# Patient Record
Sex: Male | Born: 1945 | Race: White | Hispanic: No | Marital: Married | Smoking: Former smoker
Health system: Southern US, Community
[De-identification: ages and names within clinical notes are randomized; demographics above are authoritative.]

## PROBLEM LIST (undated history)

## (undated) DIAGNOSIS — C801 Malignant (primary) neoplasm, unspecified: Secondary | ICD-10-CM

## (undated) DIAGNOSIS — R918 Other nonspecific abnormal finding of lung field: Secondary | ICD-10-CM

## (undated) DIAGNOSIS — C7951 Secondary malignant neoplasm of bone: Secondary | ICD-10-CM

## (undated) DIAGNOSIS — K759 Inflammatory liver disease, unspecified: Secondary | ICD-10-CM

## (undated) DIAGNOSIS — M79643 Pain in unspecified hand: Secondary | ICD-10-CM

## (undated) DIAGNOSIS — I714 Abdominal aortic aneurysm, without rupture, unspecified: Secondary | ICD-10-CM

## (undated) DIAGNOSIS — K7460 Unspecified cirrhosis of liver: Secondary | ICD-10-CM

## (undated) DIAGNOSIS — I959 Hypotension, unspecified: Secondary | ICD-10-CM

## (undated) DIAGNOSIS — B181 Chronic viral hepatitis B without delta-agent: Secondary | ICD-10-CM

## (undated) DIAGNOSIS — R0600 Dyspnea, unspecified: Secondary | ICD-10-CM

## (undated) DIAGNOSIS — D649 Anemia, unspecified: Secondary | ICD-10-CM

## (undated) DIAGNOSIS — M1990 Unspecified osteoarthritis, unspecified site: Secondary | ICD-10-CM

## (undated) DIAGNOSIS — J449 Chronic obstructive pulmonary disease, unspecified: Secondary | ICD-10-CM

## (undated) DIAGNOSIS — K219 Gastro-esophageal reflux disease without esophagitis: Secondary | ICD-10-CM

## (undated) HISTORY — DX: Chronic obstructive pulmonary disease, unspecified: J44.9

## (undated) HISTORY — DX: Dyspnea, unspecified: R06.00

## (undated) HISTORY — DX: Chronic viral hepatitis B without delta-agent: B18.1

## (undated) HISTORY — DX: Abdominal aortic aneurysm, without rupture: I71.4

## (undated) HISTORY — DX: Hypotension, unspecified: I95.9

## (undated) HISTORY — DX: Inflammatory liver disease, unspecified: K75.9

## (undated) HISTORY — DX: Other nonspecific abnormal finding of lung field: R91.8

## (undated) HISTORY — DX: Abdominal aortic aneurysm, without rupture, unspecified: I71.40

## (undated) HISTORY — DX: Secondary malignant neoplasm of bone: C79.51

## (undated) HISTORY — PX: APPENDECTOMY: SHX54

## (undated) HISTORY — PX: THROAT SURGERY: SHX803

## (undated) HISTORY — PX: TONSILLECTOMY: SUR1361

## (undated) HISTORY — DX: Pain in unspecified hand: M79.643

## (undated) HISTORY — DX: Anemia, unspecified: D64.9

## (undated) HISTORY — PX: BACK SURGERY: SHX140

## (undated) HISTORY — DX: Unspecified cirrhosis of liver: K74.60

## (undated) HISTORY — DX: Malignant (primary) neoplasm, unspecified: C80.1

## (undated) HISTORY — DX: Gastro-esophageal reflux disease without esophagitis: K21.9

## (undated) HISTORY — DX: Unspecified osteoarthritis, unspecified site: M19.90

---

## 2001-07-22 ENCOUNTER — Encounter (INDEPENDENT_AMBULATORY_CARE_PROVIDER_SITE_OTHER): Payer: Self-pay | Admitting: *Deleted

## 2001-07-22 ENCOUNTER — Inpatient Hospital Stay (HOSPITAL_COMMUNITY): Admission: EM | Admit: 2001-07-22 | Discharge: 2001-08-01 | Payer: Self-pay

## 2001-07-22 ENCOUNTER — Encounter (INDEPENDENT_AMBULATORY_CARE_PROVIDER_SITE_OTHER): Payer: Self-pay | Admitting: Specialist

## 2001-07-23 ENCOUNTER — Encounter: Payer: Self-pay | Admitting: Internal Medicine

## 2001-07-24 ENCOUNTER — Encounter: Payer: Self-pay | Admitting: Gastroenterology

## 2005-06-09 ENCOUNTER — Ambulatory Visit (HOSPITAL_COMMUNITY): Admission: RE | Admit: 2005-06-09 | Discharge: 2005-06-09 | Payer: Self-pay

## 2006-05-30 ENCOUNTER — Ambulatory Visit (HOSPITAL_COMMUNITY): Admission: RE | Admit: 2006-05-30 | Discharge: 2006-05-30 | Payer: Self-pay

## 2007-09-07 ENCOUNTER — Ambulatory Visit (HOSPITAL_COMMUNITY): Admission: RE | Admit: 2007-09-07 | Discharge: 2007-09-08 | Payer: Self-pay | Admitting: Orthopedic Surgery

## 2008-02-19 ENCOUNTER — Emergency Department (HOSPITAL_COMMUNITY): Admission: EM | Admit: 2008-02-19 | Discharge: 2008-02-19 | Payer: Self-pay | Admitting: Emergency Medicine

## 2008-08-15 ENCOUNTER — Ambulatory Visit: Payer: Self-pay | Admitting: Gastroenterology

## 2008-08-28 ENCOUNTER — Ambulatory Visit (HOSPITAL_COMMUNITY): Admission: RE | Admit: 2008-08-28 | Discharge: 2008-08-28 | Payer: Self-pay | Admitting: Gastroenterology

## 2009-08-14 ENCOUNTER — Ambulatory Visit: Payer: Self-pay | Admitting: Gastroenterology

## 2009-10-27 ENCOUNTER — Ambulatory Visit (HOSPITAL_COMMUNITY): Admission: RE | Admit: 2009-10-27 | Discharge: 2009-10-27 | Payer: Self-pay | Admitting: Gastroenterology

## 2010-05-24 HISTORY — PX: PROSTATECTOMY: SHX69

## 2010-06-14 ENCOUNTER — Encounter: Payer: Self-pay | Admitting: Gastroenterology

## 2010-08-10 LAB — CREATININE, SERUM
GFR calc Af Amer: >60 mL/min (ref >60)
GFR calc non Af Amer: >60 mL/min (ref >60)

## 2010-10-06 NOTE — Op Note (Signed)
NAME:  Alexander Ellis, MORI NO.:  [id number]   MEDICAL RECORD NO.:  [id number]          PATIENT TYPE:  OIB   LOCATION:  0098                         FACILITY:  [facility]   PHYSICIAN:  Georges Lynch. Gioffre, M.D.DATE OF BIRTH:  [birth year]   DATE OF PROCEDURE:  09/07/2007  DATE OF DISCHARGE:                               OPERATIVE REPORT   SURGEON:  Georges Lynch. Darrelyn Hillock, M.D.   ASSISTANT:  Marlowe Kays, M.D.   PREOPERATIVE DIAGNOSES:  1. Severe spinal stenosis at L4-5.  2. 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   POSTOPERATIVE DIAGNOSES:  1. Severe spinal stenosis at L4-5.  2. 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   OPERATIONS:  1. Complete decompressive lumbar laminectomy at L4-5 for spinal      stenosis.  2. Microdiskectomy at L4-5 on the left for a large herniated disk.   PROCEDURE:  Under general anesthesia with the patient on a spinal frame,  a routine orthopedic prep and draping of the back was carried out.  He  had 600 mg of clindamycin IV preoperatively.  At this time, after  sterile prep and drape was carried out, an incision was made over the  previous incision site and extended proximally.  Note, he had a previous  lumbar laminectomy on the right at L5-S1.  At this time, bleeders were  identified and cauterized.  Self-retaining retractors were inserted.  The muscle then was stripped from the lamina and spinous processes  bilaterally.  Two Kocher clamps were placed on the spinous processes of  L3 and L4, and an X-ray was taken.  We verified the L4-5 space.  At this  time,  another x-ray was later taken to verify the exact location of the  disk herniation.  I then inserted the [facility] retractors, and  following that I then removed the spinous process of L4.  Then, the  lamina of L4 was completely removed; we did a complete bilateral  decompressive  lumbar laminectomy at L4-L5.  At that time, we identified  the ligamentum flavum; the microscope was brought in prior to doing the  laminectomy.  We then removed the ligamentum flavum and decompressed the  dura.  Then we went out far laterally on the left, where the disk  herniation was, because it was an extremely large disk.  We identified  the L5 root; it was severely swollen.  We gently retracted it.  We  cauterized the lateral recess veins with a bipolar.  We identified the  large herniated disk.  A cruciate incision was made in the posterior  longitudinal ligament, and a complete diskectomy was carried out.  I  utilized a nerve root and the Epstein curets to go out and remove the  subligamentous disk fragments as well.  We went down and did a  foraminotomy as well.  Note, all of his symptoms were on the left.  We  made several passes into the disk space and subligamentous space, to  make sure there were no other disk fragments or compression; there was  not.  The dura now was free, as well as the root.  We thoroughly  irrigated out the area, loosely applied some thrombin-soaked Gelfoam to  the area, and then closed the wound layers in the usual fashion.  Note,  I left a small  portion of the distal and proximal deep parts of the wound open for  drainage purposes.  The skin was closed with metal staples and a sterile  Neosporin dressing was applied.  The patient left the operative room in  satisfactory position.           ______________________________  Georges Lynch Darrelyn Hillock, M.D.     RAG/MEDQ  D:  09/07/2007  T:  09/07/2007  Job:  [id number]

## 2010-10-09 NOTE — Discharge Summary (Signed)
[place]  Patient:    Alexander Ellis, Alexander Ellis Visit Number: [id number] MRN: [id number]          Service Type: MED Location: 5000 [id number] Attending Physician:  Madaline Guthrie Dictated by:   Jennette Kettle, M.D. Admit Date:  07/22/2001 Disc. Date: 07/28/01   CC:         Yvetta Coder, M.D. [facility]  Marguerita Merles, M.D., [facility]  Florencia Reasons, M.D.   Discharge Summary  DISCHARGE DIAGNOSES:  1. Chronic active hepatitis B, with evidence of end-stage liver disease     (newly diagnosed).  2. Coagulopathy, with an increased INR of 2.3 secondary to #1.  3. Acute ascites secondary to #1.  4. History of tobacco abuse.  5. History of back surgery x3, all greater than ten years ago.  6. Status post appendectomy.     a. Tonsillectomy.  7. History of gastroesophageal reflux disease.  DISCHARGE MEDICATIONS:  1. Lamivudine 100 mg p.o. q.d.  2. Lasix 80 mg p.o. q.d.  3. Spironolactone 50 mg p.o. b.i.d.  4. Phenergan 25 mg q.6h p.r.n. nausea.  5. K-Dur 10 mEq p.o. q.d.  6. Home oxygen 2 liters.  DISCHARGE DIET: The patient is instructed to have a low-sodium/fluid restriction diet.  FOLLOW-UP: The patient has a follow-up appointment with Dr. Yvetta Coder on Monday, August 07, 2001 at 8:45 a.m. at [facility], telephone number [phone].  DISCHARGE INSTRUCTIONS: The patient was instructed that if he is unable to make his appointment to please call the liver clinic.  If the patient is unable to make his appointment because of problems prior to his appointment, the patient may call the liver clinic or return to the emergency room.  CONSULTANTS:  1. Florencia Reasons, M.D. with gastroenterology.  2. Marguerita Merles, M.D., at [facility].  (This was a telephone     conversation regarding the patient).  PROCEDURES/DIAGNOSTIC STUDIES:  1. Abdominal ultrasound on July 22, 2001, with impression being extremely  contracted gallbladder, with no evidence of gallstones.  The liver     appeared echogenic with irregular margins and ascites, most suggestive of     changes of cirrhosis.  The pancreas was obscured by bowel gas.  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  (Of note, a follow-up CT did not reveal any portal vein     thrombosis).  2. CT of the abdomen and pelvis with contrast on July 23, 2001, with the     impression being cirrhosis, with a moderate amount of ascites.  The     portal vein and splenic vein were patent.  There were small bilateral     pleural effusions and bibasilar atelectasis noted.  There was also a 1 cm     adrenal lesion bilaterally, which may represent an adenoma but was thought     to be nonspecific.  There was also moderate hiatal hernia of note.     Otherwise, unremarkable.  3. Ultrasound-guided paracentesis performed on July 24, 2001, with     results revealing white blood cell count of 55, protein and albumin were     less than 1.0.  Fluid 600 cc was drained and thought to be bilious     ascites.  The cell Differential in the fluid was 55 wbcs with 15%     neutrophils, 17% lymphocytes, and 67% monocytes.  There was no evidence of     malignant cells on pathology.  4. Transjugular liver biopsy performed on July 24, 2001.  This was     performed without complications and the pathology of the liver biopsy     revealed increased fibrous tissue surrounding regenerative parenchymal     nodules.  There are foci of interface hepatitis and there is focally     marked bile ductular proliferation within the fibrous tissue.  There is     a focally moderate amount of golden-brown pigment accumulation within the     hepatocytes and Kupffer cells.  There are rare cells with homogeneous     eosinophilic cytoplasm consistent with ground-glass hepatocytes, and the     cirrhosis is most likely related to chronic active  hepatitis B.  There was     focal, mild iron-staining with iron stain, but the degree of siderosis was     not thought to be compatible with a diagnosis of hemochromatosis.     Trichrome stain confirmed the presence of cirrhosis.  5. Transesophageal echocardiogram performed on July 25, 2001 with     impression being overall left ventricular systolic function was vigorous,     the left ventricular ejection fraction was estimated to be 65-75%.  There     was no evidence of left ventricular regional wall motion abnormalities;     however, the study was thought to be inadequate for specific evaluation of     this.  There was no evidence of significant mitral valve regurgitation,     tricuspid regurgitation, pericardial effusion, or other valve     abnormalities.  HISTORY OF PRESENT ILLNESS: Briefly, [name] is a 65 year old Caucasian male, who presented to the emergency room on Saturday, July 22, 2001, with a three week history of "upset stomach," as well as brown emesis occurring approximately three to four episodes over the previous three weeks.  This occurred mainly when the patient was lying down at night.  The patient had also noted some increased abdominal distention, as well as increased fatigue and generalized weakness.  The patient states that whenever he would get home from work he would go to bed shortly thereafter, which was new to him.  The patient had not been seen by a doctor recently, with his last primary care physician appointment two years prior, and that was made on a p.r.n. basis. The patient was not taking any medication at home other than Pepcid AC and milk of magnesia for his upset stomach in the prior two to three weeks.  The patient had no history of travel, blood transfusions, history of hepatitis infection they knew of, no history of Tylenol or narcotic use, and no history of HIV infections.   REVIEW OF SYSTEMS: The patient states that he had gained  approximately five to ten pounds over the last three weeks prior to admission, and had generalized hot and cold sensation, but no documented fevers.  The patient had increased dyspnea on exertion but no cough or upper respiratory congestion or chest pain.  The patient had noticed that he was more yellow in his eyes and his skin.  The patient denied any bright red blood per rectum or blood in his urine; however, he did state that his urine was darker than usual.  The patient did complain of some cramping in his lower extremities.  The patient did not complain of any increased bleeding; however, did state that he had cut his finger a  week before and he had to use a Band-Aid, but this was not new to him.  The patient did have a recent dental extraction and crowns placed approximately a month or two before his admission without complications.  The patient denies any alcohol use or recreational or IV drug use.  SOCIAL HISTORY: No alcohol.  Positive tobacco.  No recreational or IV drug use.  The patient is married, in a monogamous relationship for 25+ years.  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  The patient enjoys hunting, and the furthest place he has been away from [place] is [place].  FAMILY HISTORY: The patients mother is alive at age 55, with a history of MI x1 and "lung problems."  The patients father is deceased at age 40 with emphysema, thought to be secondary to tobacco.  The patient has one brother, age 35, alive but recently had MI x1.  ALLERGIES:  1. The patient is allergic to PENICILLIN, which causes him to break out in     whelts.  2. CODEINE causes him to have emesis.  ADMISSION LABORATORY DATA: Initial CBC revealed a WBC of 13.0, hemoglobin 13.3, platelets 126,000; 78% neutrophils, 19% lymphocytes.  Coags revealed a PT of 18.4, INR 1.8, PTT 35.  Urinalysis showed moderate bilirubin as well as small  leukocytes; microscopic examination of urinalysis revealed few bacteria; 3-6 wbc/hpf, 0-2 rbc/hpf.  Initial electrolytes revealed a sodium of 135, potassium 4.6, chloride 103, bicarbonate 28, glucose 125, BUN 18, creatinine 1.2.  Bilirubin was elevated at 8.6, alkaline phosphatase was elevated at 157. AST was elevated at 786, ALT 599.  Total protein 6.0.  Albumin was decreased at 2.0.  Calcium was 7.8.  Initial amylase level was 107 and lipase was 40. Initial alcohol level was less than 5.  Fractionated bilirubin showed a direct bilirubin of 4.3, indirect bilirubin 4.1.  Acetaminophen level less than 0.1. Initial ammonia level was 55.  Iron studies performed revealed a ferritin of 2606, iron 190, total iron binding capacity 252, per cent saturation elevated at 75%.  Initial urine drug screen was negative.  HOSPITAL COURSE: PROBLEM #1 - CHRONIC ACTIVE HEPATITIS B: The patient was initially admitted to the hospital for further evaluation of his elevated transaminase and his evidence of liver dysfunction.  The patient did undergo an ultrasound of his abdomen, with results as above.  The patient then underwent a CT of his abdomen which showed evidence of cirrhosis, but no evidence of malignancy, and there was a patent portal vein.  The patients initial laboratories revealed his AST and ALT were elevated at 786 and ALT of 599.  The patients albumin was 2.0.  The patients INR was elevated at 1.8.  The patients bilirubin was 8.6.  The patient did have viral hepatitis panels drawn, with subsequent results revealing positive hepatitis B surface antigen, positive hepatitis B core antibody, positive hepatitis IgG and core antibody positive, negative hepatitis A, negative hepatitis C, negative HIV antibody.  The patient had a negative ANA.  The patient had a mitochondrial antibody of 0.1 (normal), the patients anti-smooth antibody IgG was 1-81:80.  The patient did have iron profiles revealing an  elevated per cent saturation.  Dr. Matthias Hughs was consulted with GI.  The patient did undergo a transjugular liver biopsy as well as a paracentesis.  The patients paracentesis revealed no evidence of malignancies and the flora was transudative.  The patients liver biopsy is detailed above,  which was suggestive of a chronic active hepatitis.  This was reviewed with pathology.  There was no evidence of increased iron deposition with special staining.  The patient did have DNA chromosomal analysis sent off for possible hereditary hemochromatosis.  This was subsequently negative.  Specifically, the patient was negative for C-2-82-Y, H63D, and S-65-C hemachromatosis mutation.  The patient did have an alpha-fetoprotein level drawn that was 53.0.  The patient had a hepatitis Be antigen positive and The patient was admitted hepatitis Be antibody was negative.  The patient did fairly well during his hospital stay.  Did develop some increased ascites and increased weight gain of approximately three to four pounds during his hospital course of six days.  The patient was started on Lasix as well as spironolactone for fluid diuresis.  The patient was started on lamivudine on day three of his hospital stay and will be continued as an outpatient on his antiviral medication.  The patient, with the help of Dr. Matthias Hughs as well as Dr. Timothy Lasso at [facility], has been scheduled for an outpatient follow-up with Dr. Robert Bellow partner, Dr. Leonides Sake, on Monday, August 07, 2001, at 8:45 a.m. for further evaluation of the patients chronic active hepatitis B and possible transplant consideration.  The patient is being discharged home on Lasix, lamivudine, spironolactone, Phenergan, and K-Dur.  The patient is also being discharged home on home oxygen 2 liters as the patients oxygen saturations would drop down to approximately 85% while ambulating on room air and approximately 90% on room air at rest.  The patient is instructed to have  a low-sodium and fluid restricted diet.  The patient did receive nutritional counseling while in the hospital.  There is no evidence of mental status change while the patient was  in the hospital, and the patients hospital discharge instructions were given to him.  The patient was advised that if there is any evidence of bleeding or increased shortness of breath or abdominal distention that he may come back to the emergency room or call the clinic here, or the clinic at [facility].  PROBLEM #2 - COAGULOPATHY: The patients initial INR was 1.8.  This subsequently increased to a value of 2.3 at discharge.  This is thought to be secondary to the patients underlying liver cirrhosis.  The patient had no active bleeding and was heme negative on guaiac checks.  PROBLEM #3 - THROMBOCYTOPENIA: The patient initially had platelets in the 120,000s and this decreased to a level of 85,000 at the time of discharge. The patient had no active bleeding.  This is thought to be secondary to #1 above.  Further evaluation with Dr. Leonides Sake as an outpatient.  DISCHARGE LABORATORY DATA: The patients most recent CBC reveals a WBC of 17.8, hematocrit 12.4, platelets 85,000 on July 28, 2001.  The patients ammonia level was 51.  The patients coags revealed a PT of 21.5, INR 2.3. The patients most recent electrolytes on July 27, 2001 revealed a sodium of 138, potassium 4.5, chloride 108, bicarbonate 23, glucose 91, BUN 18, creatinine 1.2.  The patients bilirubin was 7.5, alkaline phosphatase 143, AST 647, ALT 534, protein 5.7, albumin 1.9, and calcium 8.2. Dictated by:   Jennette Kettle, M.D. Attending Physician:  Madaline Guthrie DD:  07/28/01 TD:  07/28/01 Job: [id number]

## 2010-10-09 NOTE — Discharge Summary (Signed)
[place]. [facility]  Patient:    Alexander Ellis, Alexander Ellis Visit Number: [id number] MRN: [id number]          Service Type: MED Location: 5000 [id number] Attending Physician:  Levy Sjogren Dictated by:   Jennette Kettle, M.D. Admit Date:  07/22/2001 Discharge Date: 08/01/2001   CC:         Yvetta Coder, M.D.  Florencia Reasons, M.D.   Discharge Summary  ADDENDUM  DISCHARGE DIAGNOSES:  Please see prior discharge summary.  MEDICATIONS:  Please see prior discharge summary as medications have not changed.  MEDICAL CARE SINCE PRIOR DISCHARGE SUMMARY:  Mr. Weathington on the evening of March 7 decided to stay in the hospital.  The family had made arrangements with [facility] for possible transfer to their liver specialists. The family was uncomfortable with going to [facility] given that they would have to wait until March 17 to see Dr. Leonides Sake.  On March 7 the family notified us that they wanted to go to [facility] and had given [place] telephone numbers to contact down there.  Over the weekend on the eighth and ninth not much was able to be accomplished secondary to weekend work.  The patient remained stable during his hospital stay over the weekend.  On March 10 patients information was faxed to [facility] to the liver specialists.  Dr. Roosvelt Harps was contacted.  Patients information was faxed and the case was reviewed.  He was accepted at their center; however, insurance purposes were discussed and the family thought that it would be better to continue his care at [facility] as previously outlined in the previously dictated discharge summary.  Currently, the patient is in the same condition as he was on March 7 at his previous discharge.  Patient did have laboratories drawn on March 10 for further evaluation of his liver function which revealed a sodium 135, potassium 4.6, chloride 100, bicarbonate 31, glucose 86, BUN 30, creatinine 1.6.   Patients bilirubin was slightly increased from prior discharge at 10.4.  Patients alkaline phosphatase was 141.  Patients AST was decreased to a level of 263.  The patients ALT decrease from his prior 500-600s to a level of 265.  Patients protein was 6.4, albumin 2.2, calcium 8.9.  Patients most recent CBC on the 10th revealed white count 17.0, hemoglobin 11.6, platelets 92,000.  Patient did remain afebrile throughout his hospital stay and patients weight has slightly increased, but has remained relatively stable over the last four days on diuretic medication.  Patients most recent coags revealed an INR of 1.8.  This was after 2 units of FFP given over the weekend as the patient was going to have a paracentesis but this was canceled.  No further laboratory work is available since his previous discharge summary.  Patient still has a pending hepatitis delta currently. Patient will be discharged home with [facility] to provide home oxygen.  Patient does have a follow-up appointment with Dr. Yvetta Coder on March 17 at 8:45 a.m.  Patient was given telephone number and this information.  Patient and his family were advised that if his situation deteriorated at home not to hesitate to come back to the emergency room either here or at [facility]. Dictated by:   Jennette Kettle, M.D. Attending Physician:  Levy Sjogren DD:  08/01/01 TD:  08/03/01 Job: [id number]

## 2010-10-09 NOTE — Consult Note (Signed)
[place]  Patient:    Alexander Ellis, Alexander Ellis Visit Number: [id number] MRN: [id number]          Service Type: MED Location: [id number] Attending Physician:  Madaline Guthrie Dictated by:   Florencia Reasons, M.D. Proc. Date: 07/22/01 Admit Date:  07/22/2001   CC:         Hayden Rasmussen, [place]   Consultation Report  REASON FOR CONSULTATION:  The Internal Medicine Teaching Service asked me to see this 65 year old Caucasian male because of new onset liver disease.  HISTORY OF PRESENT ILLNESS:  The patient was admitted to the emergency room this afternoon when he was found to have markedly abnormal liver chemistries, ascites, and ultrasonographic evidence of portal vein thrombosis.  The patient has no prior history of liver disease or any evident risk factors for liver disease (see details below). For the past several weeks, he had felt sick when he ate, with a tendency for food to want to come back up, especially when he would lie down.  For the past five days, he has had the abrupt onset of abdominal distention and tightness. He has also felt weak and washed out. Despite this, he continued to work all last week.  No obvious hepatitis exposures. No fevers or chills. No history of alcohol use or previous drug abuse or blood transfusion. No family history of liver diseases. No history of sexual promiscuity. He has noticed jaundice and dark urine for the past five days or so. No evident bleeding, diathesis, or mental status changes.  The patient has been taking vitamin supplements including vitamin C, vitamin E, and a multivitamin, but no herbal preparations, and no chronic prescription medications.  PAST MEDICAL HISTORY:  ALLERGIES:  PENICILLIN (hives) and CODEINE (nausea and vomiting).  OUTPATIENT MEDICATIONS:  None chronically. Has used Milk of Magnesia and Pepcid AC recently.  PAST SURGICAL HISTORY:  Back surgery x 3 apparently not requiring  a transfusion for any of the operations.  CHRONIC MEDICAL ILLNESSES:  None. No cardiopulmonary disease, diabetes, or hypertension.  HABITS:  The patient smokes one pack per day for about 30 years, nondrinker, no IV drugs.  FAMILY HISTORY:  Negative for liver disease.  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  REVIEW OF SYSTEMS:  Interestingly, despite his increase in abdominal girth, the patient reports a stable weight. Recent tendency for regurgitation/reflux.  PHYSICAL EXAMINATION:  VITAL SIGNS:  Temperature 96.9, blood pressure 144/77, pulse about 110, respirations 24 and unlabored.  GENERAL:  Negative for a stigma of chronic liver disease. The patient is coherent and appropriate. He had no obvious or overt encephalopathy although formal mental status testing was not performed.  SKIN:  No palmar erythema, no spider angiomata.  BREASTS:  No gynecomastia.  CHEST:  Clear.  HEART:  Normal.  ABDOMEN:  Very protuberant and quite tight but not tender. Liver span by scratch test is normal. Spleen nonpalpable.  RECTAl:  Heme-negative with external hemorrhoids.  EXTREMITIES:  1+ lower extremity edema with pitting.  There is mild jaundice.  LABORATORY DATA:  Renal function normal. Glucose normal. Albumin 2.0, pro time 18.4 seconds with INR of 1.8. Hemoglobin 13.3, white count 13,000, MCV 104, RDW 20, platelets 126,000. Liver chemistries show mild elevation of alk phos at 157, total bilirubin 8.6 (fractionation pending), AST 786, ALT 599.  Ultrasound showed ascites, increased echogenicity of the liver, portal vein thrombosis, apparently  no splenomegaly.  IMPRESSION:  Given the rather significant elevation of liver chemistries, I suspect acute hepatitis. The entities which would give LFTs this high would not typically be alcohol (for which there is no history anyway), chronic hepatitis C, or  fatty infiltration of the liver. Instead, I am thinking more about acute hepatitis A in the resolving phase, since he has had gastrointestinal tract symptomology with nausea and systemic symptoms with fatigue.  PLAN:  I agree with the plans for the CT scan to better characterize the presence or absence of portal vein thrombosis, and we will await the results of the various blood tests. At the moment, I would be inclined to defer the decision about therapeutic paracentesis until we see what the CT scan shows and in particular how much ascites is present.  The patient may be a candidate for a transjugular liver biopsy (in view of his coagulopathy) if his various blood tests are nondiagnostic.  I appreciate the opportunity to have seen this very pleasant patient in consultation with you. Dictated by:   Florencia Reasons, M.D. Attending Physician:  Madaline Guthrie DD:  07/22/01 TD:  07/23/01 Job: [id number]

## 2010-10-15 ENCOUNTER — Ambulatory Visit (INDEPENDENT_AMBULATORY_CARE_PROVIDER_SITE_OTHER): Payer: BC Managed Care – PPO | Admitting: Gastroenterology

## 2010-10-15 ENCOUNTER — Other Ambulatory Visit: Payer: Self-pay | Admitting: Gastroenterology

## 2010-10-15 VITALS — BP 125/85 | HR 75 | Temp 98.1°F | Ht 69.0 in | Wt 176.0 lb

## 2010-10-15 DIAGNOSIS — B181 Chronic viral hepatitis B without delta-agent: Secondary | ICD-10-CM

## 2010-10-15 LAB — HEPATIC FUNCTION PANEL
ALT: 16 U/L (ref 0–53)
AST: 22 U/L (ref 0–37)
Alkaline Phosphatase: 55 U/L (ref 39–117)
Indirect Bilirubin: 1 mg/dL — ABNORMAL HIGH (ref 0.0–0.9)
Total Bilirubin: 1.3 mg/dL — ABNORMAL HIGH (ref 0.3–1.2)

## 2010-10-15 LAB — LIPID PANEL
Cholesterol: 147 mg/dL (ref 0–200)
Triglycerides: 83 mg/dL (ref <150)

## 2010-10-15 LAB — PSA: PSA: 7.41 ng/mL — ABNORMAL HIGH (ref <=4.00)

## 2010-10-15 LAB — HEPATITIS B SURFACE ANTIBODY,QUALITATIVE

## 2010-10-15 LAB — CREATININE, SERUM: Creat: 0.93 mg/dL (ref 0.40–1.50)

## 2010-10-16 LAB — HEPATITIS B DNA, ULTRAQUANTITATIVE, PCR

## 2010-10-16 LAB — HEPATITIS A ANTIBODY, TOTAL: Hep A Total Ab: NEGATIVE

## 2010-10-22 NOTE — Progress Notes (Signed)
REASON FOR VISIT:  Followup of hepatitis B seroconverted to surface antibody positivity.    HISTORY:  The patient returns today unaccompanied. He is now a 65 year old gentleman with a history of hepatitis B.  He first presented to [facility] with decompensate hepatitis B in 2003. He was evaluated for transplant  at that time and was listed. He was also treated with lamivudine at the time. He seroconverted from surface antigen to surface antibody positivity and made a dramatic improvement, though he remained listed  for transplant. By 11/30/2003 he was seen by Dr. Iva Boop at [facility] for consideration of listing at [facility] because of a change of his insurance.  He was then followed at [facility] on lamivudine until it was  discontinued on 05/31/2005 by Dr. Katrinka Blazing. He has been followed expectantly since and has returned to my care in [place].  Since his last clinic appointment with me on 08/14/2009, there have been no symptoms referable to a history of hepatitis C or symptoms to suggest decompensated liver disease.    Past medical history:  There is no interval change since he last was seen. He feels well. I am following an abdominal aortic aneurysm previously seen on imaging  last year. He has no primary care physician, though I have previously told him that he needs to obtain one.    CURRENT MEDICATIONS:  None.    Allergies:  Penicillin causes a rash.   HABITS:   smoking:  Still smoking, less than a pack of cigarettes per day.   Alcohol:  Denies interval consumption.   REVIEW OF SYSTEMS:  All 10 systems reviewed today with the patient and are negative other than that which is mentioned above.   PHYSICAL EXAMINATION:  Constitutional:  Very well appearing and appeared stated age. There is no significant bitemporal wasting. Vital signs: Height 69 inches, weight 176 pounds, down 10 pounds from previously.  Blood pressure  125/85, pulse 85, temperature 98.1 degrees Fahrenheit.  Ears, nose, mouth and  throat:  Unremarkable oropharynx.  No thyromegaly or neck masses.  Chest:  Resonant to percussion.  Clear to auscultation.   Cardiovascular:  Heart sounds normal S1, S2 without murmurs or rubs.  There is no peripheral edema.  Abdominal:  Normal bowel sounds.  No masses or tenderness.  I could not appreciate a liver edge or spleen  tip.  I could not appreciate any hernias.  Lymphatics:  No cervical or inguinal lymphadenopathy.  Central Nervous System:  No asterixis or focal neurologic findings.  Dermatologic:  Anicteric without palmar  erythema or spider angiomata.  Eyes:  Anicteric sclerae.  Pupils are equal and reactive to light.   Laboratory data:  Previously, on 08/14/2009, hepatitis B surface antibody was positive and surface antigen was negative. His ALT was 14.  His hepatitis BE  antigen was negative, and E antibody was positive.  HBV DNA was undetectable.   IMAGING STUDIES:  His last imaging study was an MRI on 10/27/2009 which showed left hepatic cysts but otherwise liver appeared normal. There was no splenomegaly. There is a 3.7 x 3.7 abdominal aortic aneurysm. Whether  it is infra or suprarenal was not mentioned, but presumably it was suprarenal because it was previously visible on ultrasound on 08/28/2004.   ASSESSMENT:  The patient is a 65 year old man with a history of previously decompensate hepatitis B, who has undergone surface antigen to surface antibody seroconversion over 7 years ago and is now off lamivudine  over 5 years. He has done extremely well  with restoration of his liver function. I continue to follow him annually because of his previous history of decompensation.  The patient continues to avoid obtaining a primary care physician.  At minimum, I need to do another imaging study to follow up on his aneurysm. If it grows in size we may need to send him to Vascular  Surgery at [facility].  In my discussion today with the patient, we discussed the importance of obtaining a  primary care physician locally, but he again is   reluctant to do so. I explained the rationale for obtaining a CT scan of the abdomen to follow up on the aneurysm.   plan:  1. Hepatitis B testing with BE antigen and antibody, surface antigen and antibody, an HBV DNA. 2. Will test him for hepatitis A immunity. 3. Liver enzymes today. 4. Will check a lipid profile, glucose, and creatinine fasting. 5. CT scan with contrast to follow up on the aneurysm in addition to his history of hepatitis B. 6. He will be due for another screening colonoscopy after 07/2011. 7. He will follow up with me in a year's time, at which point we can book the colonoscopy. 8. Smoking cessation reinforced.             Brooke Dare, MD   ADDENDUM:  Liver tests good.  Hep B remains immune.  PSA 7.41  ULN < 4.  Letter written as will need a free PSA and digital rectal exam or to seek out a primary care MD for this.    403 .[id number]  D:  Thu May 24 21:39:58 2012 ; T:  Sat May 26 17:37:57 2012  Job #:  [id number]

## 2010-11-12 ENCOUNTER — Ambulatory Visit (HOSPITAL_COMMUNITY)
Admission: RE | Admit: 2010-11-12 | Discharge: 2010-11-12 | Disposition: A | Discharge status: Home / Self Care | Payer: BC Managed Care – PPO | Source: Ambulatory Visit | Attending: Gastroenterology | Admitting: Gastroenterology

## 2010-11-12 ENCOUNTER — Other Ambulatory Visit: Payer: Self-pay | Admitting: Gastroenterology

## 2010-11-12 DIAGNOSIS — B181 Chronic viral hepatitis B without delta-agent: Secondary | ICD-10-CM

## 2010-11-12 DIAGNOSIS — K8020 Calculus of gallbladder without cholecystitis without obstruction: Secondary | ICD-10-CM | POA: Insufficient documentation

## 2010-11-12 DIAGNOSIS — I714 Abdominal aortic aneurysm, without rupture, unspecified: Secondary | ICD-10-CM | POA: Insufficient documentation

## 2010-11-12 DIAGNOSIS — K7689 Other specified diseases of liver: Secondary | ICD-10-CM | POA: Insufficient documentation

## 2010-11-12 DIAGNOSIS — J8410 Pulmonary fibrosis, unspecified: Secondary | ICD-10-CM | POA: Insufficient documentation

## 2010-11-12 DIAGNOSIS — Q619 Cystic kidney disease, unspecified: Secondary | ICD-10-CM | POA: Insufficient documentation

## 2010-11-12 MED ORDER — IOHEXOL 300 MG/ML  SOLN: 180.0000 mL | Freq: Once | INTRAMUSCULAR | Status: AC | PRN

## 2010-12-03 ENCOUNTER — Ambulatory Visit (INDEPENDENT_AMBULATORY_CARE_PROVIDER_SITE_OTHER): Payer: BC Managed Care – PPO | Admitting: Gastroenterology

## 2010-12-03 VITALS — BP 136/88 | HR 72 | Temp 96.9°F | Ht 69.0 in | Wt 175.0 lb

## 2010-12-03 DIAGNOSIS — B181 Chronic viral hepatitis B without delta-agent: Secondary | ICD-10-CM

## 2010-12-10 NOTE — Progress Notes (Signed)
NAME:  Alexander Ellis, Alexander Ellis  MR#:  [id number]      DATE:  12/03/2010  DOB:  07-26-1945    cc: Consulting Physician:  [facility], [address] [place], [address], [facility], [place], [place] [address], Fax [phone] Primary care physician:  None. Referring Physician:  Adah Salvage. Misenheimer, MD, [facility], [address], [place], [place] [address], Fax [phone]    reason for visit:   Followup of elevated PSA and infrarenal aortic aneurysm.   history:  Patient returns today unaccompanied. He is a 65 year old gentleman who I had first seen in [facility] when he presented with decompensated hepatitis B in 2003. He was evaluated for transplant at that time, and then  listed at [facility]. He was treated lamivudine at that time for his hepatitis B and seroconverted from surface antigen to surface antibody positive. He made a dramatic improvement and then eventually moved his  listing to [facility] because of a change in insurance. By 11/30/2003, he was followed at [facility]. He continued to be followed expectantly at [facility] by Dr. Larey Dresser and eventually was delisted because of how  stable he was. His lamivudine was discontinued by 05/31/2005. He then returned to my care in [place] and I have been following him since 2010.  As part of his routine care, on 10/15/2010, I obtained a PSA,  because he had not obtained a primary physician though I have talked to him about doing so. This was 7.41 with the upper limits of normal being 4. In addition, I have been doing surveillance imaging of  his infrarenal aortic aneurysm and brought him back to discuss results of this.  Today, Mr. Leonhardt feels well. There are no symptoms of decompensated liver disease. He also denies any urinary symptoms such as hesitancy in his stream, hematuria, or sensation of incomplete voiding.   PAST MEDICAL HISTORY:  Significant for the infrarenal aortic aneurysm.   CURRENT  MEDICATIONS:  None.    Allergies:  Penicillin causes a rash.   HABITS:  Smoking, still smokes, but less than a pack of cigarettes per day. Alcohol, denies interval consumption.   REVIEW OF SYSTEMS:  All 10 systems reviewed today with the patient and they are negative other than which was mentioned above.   PHYSICAL EXAMINATION:  Constitutional: Well appearing without stigmata of chronic liver disease. Vital signs: Height 69 inches, weight 175 pounds unchanged from his followup on 10/15/2010, blood pressure 136/88, pulse 72,  temperature 96.9 Fahrenheit. I did a digital rectal examination. I could not appreciate an enlarged or hardened prostate.   Laboratories:  CT scan of the abdomen and pelvis on 11/12/2010, showed a 3.9 cm infrarenal aortic aneurysm that has been practically unchanged from  previous with benign-appearing hepatic and renal cysts. There is mild prostatic enlargement on CT scan.   ASSESSMENT:  This is a 65 year old gentleman with a history of hepatitis B that had successfully seroconverted from surface antigen to surface antibody and now does not have active disease as recently as the last follow up  on 10/15/2010.  In terms of his infrarenal aortic aneurysm, it is not a size that needs to have any intervention. He does need follow up. Both because of size and because of his difficulty in taking intravenous contrast  for the CT scan, I think I would switch back to an ultrasound every 6 months.  In terms of his PSA, I do not feel a prostate today, but this is beyond  the area of my expertise and it would be best if he was followed by a urologist. This also points out that he needs primary  care as I have discussed with him before.  Today, I discussed the results of the aneurysm CT with the patient. I explained the size of the aneurysm. We discussed the rationale for moving to doing an ultrasound every 6 months of the infrarenal  aneurysm. We also discussed results of the  PSA. I have explained its significance and the rationale for a referral to a urologist to monitor this. I have also explained to the patient, as I have done  many times in the past, he needs to find a primary physician as I acknowledged the PSA and aneurysm are beyond the area of my expertise. The patient indicates he would try to find someone in [place]. He  would like to have a referral to a urologist at [facility] for monitoring of his PSA.   PLAN:  1. Free PSA drawn today. 2. I will make a referral to [facility] regarding the elevated PSA. 3. Ultrasound of the infrarenal aortic aneurysm in December 2012, approximately 6 months from his last imaging study. 4. He will return to see me in March 2013. 5. He is hepatitis A naive, we can vaccinate him at next appointment.            Brooke Dare, MD   ADDENDUM: Free PSE 0.8  or 11% of total  representing an ESTIMATE 35% probability of prostate cancer in the right setting.  Will forward to [facility].  403 .[id number]  D:  Thu Jul 12 13:11:33 2012 ; T:  Thu Jul 12 14:04:15 2012  Job #:  [id number]

## 2011-02-16 LAB — URINALYSIS, ROUTINE W REFLEX MICROSCOPIC
Ketones, ur: NEGATIVE
Nitrite: NEGATIVE
Protein, ur: NEGATIVE
pH: 6

## 2011-02-16 LAB — COMPREHENSIVE METABOLIC PANEL
Albumin: 3.9
BUN: 15
Calcium: 9.7
Chloride: 104
Creatinine, Ser: 1.13
Total Bilirubin: 1.3 — ABNORMAL HIGH

## 2011-02-16 LAB — APTT: aPTT: 36

## 2011-02-16 LAB — PROTIME-INR: Prothrombin Time: 12.9

## 2011-02-16 LAB — DIFFERENTIAL
Basophils Absolute: 0.1
Lymphocytes Relative: 24
Lymphs Abs: 3
Monocytes Absolute: 0.9
Neutro Abs: 8.5 — ABNORMAL HIGH

## 2011-02-16 LAB — CBC
HCT: 47
Hemoglobin: 15.7
MCHC: 33.4
Platelets: 210
RBC: 4.91

## 2011-02-24 ENCOUNTER — Other Ambulatory Visit: Payer: Self-pay | Admitting: Urology

## 2011-02-24 DIAGNOSIS — C61 Malignant neoplasm of prostate: Secondary | ICD-10-CM

## 2011-03-03 ENCOUNTER — Encounter (HOSPITAL_COMMUNITY)
Admission: RE | Admit: 2011-03-03 | Discharge: 2011-03-03 | Disposition: A | Discharge status: Home / Self Care | Payer: BC Managed Care – PPO | Source: Ambulatory Visit | Attending: Urology | Admitting: Urology

## 2011-03-03 DIAGNOSIS — Z006 Encounter for examination for normal comparison and control in clinical research program: Secondary | ICD-10-CM | POA: Insufficient documentation

## 2011-03-03 DIAGNOSIS — C61 Malignant neoplasm of prostate: Secondary | ICD-10-CM

## 2011-03-03 MED ORDER — FLUDEOXYGLUCOSE F - 18 (FDG) INJECTION: 10.0000 | Freq: Once | INTRAVENOUS | Status: AC | PRN

## 2011-03-24 ENCOUNTER — Ambulatory Visit
Admission: RE | Admit: 2011-03-24 | Discharge: 2011-03-24 | Disposition: A | Discharge status: Home / Self Care | Payer: BC Managed Care – PPO | Source: Ambulatory Visit | Attending: Radiation Oncology | Admitting: Radiation Oncology

## 2011-03-24 DIAGNOSIS — I714 Abdominal aortic aneurysm, without rupture, unspecified: Secondary | ICD-10-CM | POA: Insufficient documentation

## 2011-03-24 DIAGNOSIS — B1910 Unspecified viral hepatitis B without hepatic coma: Secondary | ICD-10-CM | POA: Insufficient documentation

## 2011-03-24 DIAGNOSIS — C61 Malignant neoplasm of prostate: Secondary | ICD-10-CM | POA: Insufficient documentation

## 2011-03-24 DIAGNOSIS — K219 Gastro-esophageal reflux disease without esophagitis: Secondary | ICD-10-CM | POA: Insufficient documentation

## 2011-03-29 NOTE — Letter (Signed)
March 25, 2011    Shanon Payor, MD [address] [place], [place]  [address]  NAME:  Alexander Ellis, Alexander Ellis MRN:  [id number] DOB:  12-Jul-1945  Dear Andrey Campanile:  Thanks in advance for your help with Alexander Ellis.  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  He is interested in proceeding with neoadjuvant hormone therapy, external beam radiotherapy, and then prostate seed implantation.  He lives in [place] and would like to pursue external beam radiotherapy at [facility] with you and plans to return to [facility] to undergo prostate seed implant with Dr. Bjorn Pippin and myself 3 weeks after completion of external beam radiotherapy.  We are working on arranging a consultation visit with you.  I am interested in your opinion about our proposed plan and look forward to working with you on this case.   Oneita Hurt, M.D.  MAM/MEDQ  D:  03/25/2011  T:  03/26/2011  Job:  [id number]  CC:   John J. Annabell Howells, M.D. Shanon Payor, MD

## 2011-03-30 ENCOUNTER — Encounter: Payer: Self-pay | Admitting: *Deleted

## 2011-03-30 ENCOUNTER — Telehealth: Payer: Self-pay | Admitting: *Deleted

## 2011-03-30 NOTE — Telephone Encounter (Signed)
Received phone call from [place] at [facility] stating that Alexander Ellis appt. Has been moved from 05-24-11 to 05-03-11 - arrival time - 1:45 p.m., Karen Kitchens stated that she had called Alexander Ellis and had spoke with him and gave him the new appt. Date and time., this is for his hormone therapy discussion

## 2011-03-31 ENCOUNTER — Encounter: Payer: Self-pay | Admitting: *Deleted

## 2011-12-30 ENCOUNTER — Ambulatory Visit (INDEPENDENT_AMBULATORY_CARE_PROVIDER_SITE_OTHER): Payer: BC Managed Care – PPO | Admitting: Gastroenterology

## 2011-12-30 DIAGNOSIS — B181 Chronic viral hepatitis B without delta-agent: Secondary | ICD-10-CM

## 2011-12-31 LAB — HEPATIC FUNCTION PANEL
Albumin: 4.6 g/dL (ref 3.5–5.2)
Total Protein: 8.1 g/dL (ref 6.0–8.3)

## 2011-12-31 LAB — HEPATITIS B SURFACE ANTIBODY,QUALITATIVE

## 2012-01-03 LAB — HEPATITIS B E ANTIGEN: Hepatitis Be Antigen: NEGATIVE

## 2012-01-03 LAB — HEPATITIS B DNA, ULTRAQUANTITATIVE, PCR: Hepatitis B DNA: NOT DETECTED IU/mL (ref <20)

## 2012-01-03 LAB — HEPATITIS B E ANTIBODY: Hepatitis Be Antibody: POSITIVE — AB

## 2012-01-06 ENCOUNTER — Encounter: Payer: Self-pay | Admitting: Gastroenterology

## 2012-01-06 ENCOUNTER — Ambulatory Visit (HOSPITAL_COMMUNITY)
Admission: RE | Admit: 2012-01-06 | Discharge: 2012-01-06 | Disposition: A | Discharge status: Home / Self Care | Payer: BC Managed Care – PPO | Source: Ambulatory Visit | Attending: Gastroenterology | Admitting: Gastroenterology

## 2012-01-06 DIAGNOSIS — K8020 Calculus of gallbladder without cholecystitis without obstruction: Secondary | ICD-10-CM | POA: Insufficient documentation

## 2012-01-06 DIAGNOSIS — B181 Chronic viral hepatitis B without delta-agent: Secondary | ICD-10-CM

## 2012-01-06 DIAGNOSIS — N281 Cyst of kidney, acquired: Secondary | ICD-10-CM | POA: Insufficient documentation

## 2012-01-06 DIAGNOSIS — B1910 Unspecified viral hepatitis B without hepatic coma: Secondary | ICD-10-CM | POA: Insufficient documentation

## 2012-01-06 DIAGNOSIS — I714 Abdominal aortic aneurysm, without rupture, unspecified: Secondary | ICD-10-CM | POA: Insufficient documentation

## 2012-01-06 NOTE — Progress Notes (Addendum)
NAME:  Alexander Ellis, Alexander Ellis    MR#:  [id number]      DATE:  12/30/2011  DOB:      cc:     referring physician:  Adah Salvage. Mienheimer, MD, [facility], [address], [place], [place] [address], fax [phone].  primary care physician:  None.  consulting physician:  Garrison Columbus. Benard Rink, MD, Department of Urology, [facility], [facility] [place], [place], [place]  [address], e-mail [email or web address], phone [phone].  reason for visit:  Followup of hepatitis B now seroconverted to surface antibody positive.   HISTORY: The patient returns today for an annual appointment. He has no symptoms referable to his history of liver disease. There are no symptoms to suggest decompensated liver disease.   PAST MEDICAL HISTORY: It will be recalled that I found an elevated PSA and referred him onto a local urologist in [place]. He was offered radiotherapy but instead sought an opinion from Dr. Benard Rink at [facility] and underwent a prostatectomy. I have not received any records from this but the patient reports that he did well with surgery and he continues to follow with him. He reports his PSA levels have significantly declined since the surgery and there are no concerns.  He also has a history of an aortic aneurysm which I have been watching with serial imaging studies.   CURRENT MEDICATIONS: None.   ALLERGIES: PENICILLIN causes a rash.   HABITS: Still smoking. Alcohol:  Denies interval consumption.   REVIEW OF SYSTEMS: All 10 systems reviewed today with the patient and are negative other than which is mentioned above. He did not complete a CES-D.   PHYSICAL EXAMINATION: Constitutional:  Well appearing. Vital signs: Height 69 inches, weight 178 pounds, blood pressure 134/92, pulse of 78, temperature 97.6 Fahrenheit. No significant peripheral wasting.  Ears, Nose, Mouth and Throat:  Unremarkable oropharynx.  No thyromegaly or neck masses.   Chest:  Resonant to percussion.  Clear to auscultation.  Cardiovascular:  Heart sounds normal S1, S2 without murmurs or rubs.  There is no peripheral edema.  Abdomen:  Normal bowel sounds.  No masses or tenderness.  I could not appreciate a liver edge or spleen tip.  I could not appreciate any hernias.  Lymphatics:  No cervical or inguinal lymphadenopathy.  Central Nervous System:  No asterixis or focal neurologic findings.  Dermatologic:  Anicteric without palmar erythema or spider angiomata.  Eyes:  Anicteric sclerae.  Pupils are equal and reactive to light.  ASSESSMENT: The patient is a 66 year old gentleman with a history of decompensated hepatitis B that then serial converted to surface antibody positivity over 8 years ago. He has been off lamivudine for over 6 years. He has done extremely well with resolution of his liver function. I am continuing to follow him on an annual basis because of his initial decompensated presentation.   Because he will not get a primary care physician, I discovered his prostate cancer and he will need to follow up with Dr. Benard Rink regarding this. It will also be recalled that he has a history of an aortic aneurysm for which I am following because again he will not obtain a primary care physician despite my discussion with him about this in the past. He is due for an imaging study of this. An ultrasound in [place] would seem acceptable. He has an allergy to IV contrast and wants to avoid this at all costs.   In my discussion today with the patient we discussed the issues  as mentioned above. I also reinforced the need for smoking cessation.   PLAN:  1. Standard HBV labs today.  2. Previously hepatitis A naive on 10/15/2010; however, given his normal synthetic function there is no increased need to vaccinate him above and beyond what is needed in the population.  3. Book for ultrasound of the abdomen to both look at his liver as well as evaluate his aneurysm.   4. Smoking cessation reinforced.  5. I will see him again in one years' time.              Brooke Dare, MD  ADDENDUM  Hep B surface Ab indeterminate - likely low positive.  HBV DNA undetectable and liver enzymes normal.  10/15/10 Total Hep A negative.   403 .[id number]  D:  Thu Aug 08 19:28:35 2013 ; T:  Fri Aug 09 09:15:00 2013  Job #:  [id number]

## 2012-05-24 DIAGNOSIS — C801 Malignant (primary) neoplasm, unspecified: Secondary | ICD-10-CM

## 2012-05-24 HISTORY — DX: Malignant (primary) neoplasm, unspecified: C80.1

## 2012-10-12 ENCOUNTER — Other Ambulatory Visit: Payer: Self-pay | Admitting: Gastroenterology

## 2012-10-12 DIAGNOSIS — B1910 Unspecified viral hepatitis B without hepatic coma: Secondary | ICD-10-CM

## 2012-10-12 DIAGNOSIS — B181 Chronic viral hepatitis B without delta-agent: Secondary | ICD-10-CM | POA: Insufficient documentation

## 2012-10-12 DIAGNOSIS — I719 Aortic aneurysm of unspecified site, without rupture: Secondary | ICD-10-CM

## 2012-10-12 DIAGNOSIS — I714 Abdominal aortic aneurysm, without rupture: Secondary | ICD-10-CM | POA: Insufficient documentation

## 2012-10-13 ENCOUNTER — Other Ambulatory Visit: Payer: BC Managed Care – PPO

## 2012-10-13 ENCOUNTER — Ambulatory Visit
Admission: RE | Admit: 2012-10-13 | Discharge: 2012-10-13 | Disposition: A | Discharge status: Home / Self Care | Payer: BC Managed Care – PPO | Source: Ambulatory Visit | Attending: Gastroenterology | Admitting: Gastroenterology

## 2012-10-13 DIAGNOSIS — I719 Aortic aneurysm of unspecified site, without rupture: Secondary | ICD-10-CM

## 2012-10-13 DIAGNOSIS — B1910 Unspecified viral hepatitis B without hepatic coma: Secondary | ICD-10-CM

## 2014-05-27 ENCOUNTER — Ambulatory Visit: Payer: BC Managed Care – PPO | Admitting: Family

## 2014-06-04 ENCOUNTER — Ambulatory Visit (INDEPENDENT_AMBULATORY_CARE_PROVIDER_SITE_OTHER): Payer: BC Managed Care – PPO | Admitting: Internal Medicine

## 2014-06-04 ENCOUNTER — Encounter: Payer: Self-pay | Admitting: Internal Medicine

## 2014-06-04 ENCOUNTER — Telehealth: Payer: Self-pay | Admitting: Gastroenterology

## 2014-06-04 VITALS — BP 140/92 | HR 73 | Temp 97.6°F | Resp 18 | Ht 69.0 in | Wt 176.4 lb

## 2014-06-04 DIAGNOSIS — K5900 Constipation, unspecified: Secondary | ICD-10-CM | POA: Diagnosis not present

## 2014-06-04 MED ORDER — POLYETHYLENE GLYCOL 3350 17 GM/SCOOP PO POWD: 34.0000 g | Freq: Two times a day (BID) | ORAL | Status: DC | PRN

## 2014-06-04 MED ORDER — SENNOSIDES-DOCUSATE SODIUM 8.6-50 MG PO TABS: 1.0000 | ORAL_TABLET | Freq: Two times a day (BID) | ORAL | Status: DC

## 2014-06-04 MED ORDER — SORBITOL 70 % SOLN: 75.0000 mL | Freq: Two times a day (BID) | Status: DC | PRN

## 2014-06-04 NOTE — Patient Instructions (Signed)
We will send in sorbitol for you for constipation. Take 75 mL 1-2 times per day for constipation. Take 75 mL today when you get home, if you have not had bowel movement take another 75 mL tonight. Continue to take 2 times a day until you have bowel movement.   Once you have bowel movement continue taking once or twice a day. If you start having loose stools or too many bowel movements decrease the amount you are taking or cut down the number of times you take it a day.   We will also send in senokot which is a medicine that helps to move the bowels better that you can take twice a day (it is a pill). If you are having too many bowel movements please cut back the number of times a day you take it.   If you have questions or problems please feel free to call. If you have not moved your bowels by Thursday please call back and we may need to try a suppository next to help your bowels move.   Constipation Constipation is when a person has fewer than three bowel movements a week, has difficulty having a bowel movement, or has stools that are dry, hard, or larger than normal. As people grow older, constipation is more common. If you try to fix constipation with medicines that make you have a bowel movement (laxatives), the problem may get worse. Long-term laxative use may cause the muscles of the colon to become weak. A low-fiber diet, not taking in enough fluids, and taking certain medicines may make constipation worse.  CAUSES   Certain medicines, such as antidepressants, pain medicine, iron supplements, antacids, and water pills.   Certain diseases, such as diabetes, irritable bowel syndrome (IBS), thyroid disease, or depression.   Not drinking enough water.   Not eating enough fiber-rich foods.   Stress or travel.   Lack of physical activity or exercise.   Ignoring the urge to have a bowel movement.   Using laxatives too much.  SIGNS AND SYMPTOMS   Having fewer than three bowel  movements a week.   Straining to have a bowel movement.   Having stools that are hard, dry, or larger than normal.   Feeling full or bloated.   Pain in the lower abdomen.   Not feeling relief after having a bowel movement.  DIAGNOSIS  Your health care provider will take a medical history and perform a physical exam. Further testing may be done for severe constipation. Some tests may include:  A barium enema X-ray to examine your rectum, colon, and, sometimes, your small intestine.   A sigmoidoscopy to examine your lower colon.   A colonoscopy to examine your entire colon. TREATMENT  Treatment will depend on the severity of your constipation and what is causing it. Some dietary treatments include drinking more fluids and eating more fiber-rich foods. Lifestyle treatments may include regular exercise. If these diet and lifestyle recommendations do not help, your health care provider may recommend taking over-the-counter laxative medicines to help you have bowel movements. Prescription medicines may be prescribed if over-the-counter medicines do not work.  HOME CARE INSTRUCTIONS   Eat foods that have a lot of fiber, such as fruits, vegetables, whole grains, and beans.  Limit foods high in fat and processed sugars, such as french fries, hamburgers, cookies, candies, and soda.   A fiber supplement may be added to your diet if you cannot get enough fiber from foods.   Drink enough  fluids to keep your urine clear or pale yellow.   Exercise regularly or as directed by your health care provider.   Go to the restroom when you have the urge to go. Do not hold it.   Only take over-the-counter or prescription medicines as directed by your health care provider. Do not take other medicines for constipation without talking to your health care provider first.  [place] IF:   You have bright red blood in your stool.   Your constipation lasts for more than 4  days or gets worse.   You have abdominal or rectal pain.   You have thin, pencil-like stools.   You have unexplained weight loss. MAKE SURE YOU:   Understand these instructions.  Will watch your condition.  Will get help right away if you are not doing well or get worse. Document Released: 02/06/2004 Document Revised: 05/15/2013 Document Reviewed: 02/19/2013 [facility] Patient Information 2015 [place], [place]. This information is not intended to replace advice given to you by your health care provider. Make sure you discuss any questions you have with your health care provider.

## 2014-06-04 NOTE — Telephone Encounter (Signed)
Is there something else he can try?

## 2014-06-04 NOTE — Assessment & Plan Note (Signed)
He does sound as though he has been struggling with this his whole life. Last BM 1 week ago and taking miralax BID. Unclear amount. Will try sorbitol 75 mL BID and if no BM in 2 days will try suppository. In addition think he should be on senokot-d BID long term to keep things moving and for him to not struggle with his bowels. Will need to get records from his GI at [facility] to see what workup and interventions they have tried and if he does indeed have cirrhosis.

## 2014-06-04 NOTE — Telephone Encounter (Signed)
Pt's wife calling to let MD know that Sorbitol is $240, can this be changed as ins will not pay anything on this  [phone]

## 2014-06-04 NOTE — Progress Notes (Signed)
Pre visit review using our clinic review tool, if applicable. No additional management support is needed unless otherwise documented below in the visit note. 

## 2014-06-04 NOTE — Telephone Encounter (Signed)
Other option is to double dosing of miralax and continue twice a day. But the sorbitol is stronger.

## 2014-06-04 NOTE — Telephone Encounter (Signed)
Patient will double up on the miralax.

## 2014-06-04 NOTE — Progress Notes (Signed)
   Subjective:    Patient ID: Alexander Ellis, male    DOB: 1946-04-05, 69 y.o.   MRN: [id number]  HPI The patient is coming in for an acute visit for constipation.He has generally had slow bowels his whole life (every 2-3 days) but then after his prostate surgery his bowels have not ben moving even that well (several years ago). He was taking aloe vera for them but this has stopped working. He then started taking laxatives to go and had some success. He has been taking miralax twice a day for the last week without bowel movement. He does see a GI doctor at [facility] and states that they have been concerned that he may have nerve damage from prostate surgery. 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 Denies abdominal pain, nausea, vomiting. He is still passing gas normally. His last BM was last Tuesday and he did have to strain.   Review of Systems  Constitutional: Negative for fever, activity change, appetite change, fatigue and unexpected weight change.  Respiratory: Negative for cough, chest tightness, shortness of breath and wheezing.   Cardiovascular: Negative for chest pain, palpitations and leg swelling.  Gastrointestinal: Positive for constipation. Negative for nausea, vomiting, abdominal pain, diarrhea, blood in stool and abdominal distention.  Musculoskeletal: Negative.   Neurological: Negative.       Objective:   Physical Exam  Constitutional: He is oriented to person, place, and time. He appears well-developed and well-nourished.  HENT:  Head: Normocephalic and atraumatic.  Eyes: EOM are normal.  Neck: Normal range of motion.  Cardiovascular: Normal rate and regular rhythm.   Pulmonary/Chest: Effort normal and breath sounds normal.  Abdominal: Soft. Bowel sounds are normal. He exhibits no distension. There is no tenderness. There is no rebound.  Musculoskeletal: He exhibits no edema.  Neurological: He is alert and oriented to person,  place, and time.  Skin: Skin is warm and dry.   Filed Vitals:   06/04/14 0811  BP: 140/92  Pulse: 73  Temp: 97.6 F (36.4 C)  TempSrc: Oral  Resp: 18  Height: 5\' 9"  (1.753 m)  Weight: 176 lb 6.4 oz (80.015 kg)  SpO2: 95%      Assessment & Plan:

## 2014-06-17 ENCOUNTER — Telehealth: Payer: Self-pay | Admitting: Internal Medicine

## 2014-06-17 NOTE — Telephone Encounter (Signed)
Received records from [facility] to Dr. Doug Sou. 06/17/14/ss

## 2014-07-15 ENCOUNTER — Ambulatory Visit (INDEPENDENT_AMBULATORY_CARE_PROVIDER_SITE_OTHER): Payer: Medicare Other | Admitting: Internal Medicine

## 2014-07-15 ENCOUNTER — Other Ambulatory Visit (INDEPENDENT_AMBULATORY_CARE_PROVIDER_SITE_OTHER): Payer: Medicare Other

## 2014-07-15 ENCOUNTER — Encounter: Payer: Self-pay | Admitting: Internal Medicine

## 2014-07-15 ENCOUNTER — Ambulatory Visit (INDEPENDENT_AMBULATORY_CARE_PROVIDER_SITE_OTHER)
Admission: RE | Admit: 2014-07-15 | Discharge: 2014-07-15 | Disposition: A | Discharge status: Home / Self Care | Payer: Medicare Other | Source: Ambulatory Visit | Attending: Internal Medicine | Admitting: Internal Medicine

## 2014-07-15 VITALS — BP 128/78 | HR 82 | Temp 98.3°F | Resp 14 | Ht 69.0 in | Wt 173.8 lb

## 2014-07-15 DIAGNOSIS — Z8546 Personal history of malignant neoplasm of prostate: Secondary | ICD-10-CM

## 2014-07-15 DIAGNOSIS — B181 Chronic viral hepatitis B without delta-agent: Secondary | ICD-10-CM | POA: Diagnosis not present

## 2014-07-15 DIAGNOSIS — I714 Abdominal aortic aneurysm, without rupture, unspecified: Secondary | ICD-10-CM

## 2014-07-15 DIAGNOSIS — Z0000 Encounter for general adult medical examination without abnormal findings: Secondary | ICD-10-CM

## 2014-07-15 DIAGNOSIS — Z720 Tobacco use: Secondary | ICD-10-CM

## 2014-07-15 DIAGNOSIS — Z9079 Acquired absence of other genital organ(s): Secondary | ICD-10-CM

## 2014-07-15 DIAGNOSIS — Z23 Encounter for immunization: Secondary | ICD-10-CM

## 2014-07-15 DIAGNOSIS — K5900 Constipation, unspecified: Secondary | ICD-10-CM

## 2014-07-15 DIAGNOSIS — Z299 Encounter for prophylactic measures, unspecified: Secondary | ICD-10-CM

## 2014-07-15 DIAGNOSIS — F17200 Nicotine dependence, unspecified, uncomplicated: Secondary | ICD-10-CM

## 2014-07-15 LAB — PSA: PSA: 49.26 ng/mL — AB (ref 0.10–4.00)

## 2014-07-15 LAB — CBC
HCT: 46.5 % (ref 39.0–52.0)
Hemoglobin: 15.7 g/dL (ref 13.0–17.0)
MCHC: 33.9 g/dL (ref 30.0–36.0)
MCV: 92.1 fl (ref 78.0–100.0)
PLATELETS: 248 10*3/uL (ref 150.0–400.0)
RBC: 5.05 Mil/uL (ref 4.22–5.81)
RDW: 14 % (ref 11.5–15.5)
WBC: 12.7 10*3/uL — AB (ref 4.0–10.5)

## 2014-07-15 NOTE — Patient Instructions (Signed)
We will call your insurance and send in something they will cover for the constipation. We will then call you.   We are checking your labs today and the x-ray which we will call you back about.  It may be worthwhile to see a GI doctor back again. If you want to see someone in [place] let me know otherwise you can go back to [facility].   We have given you the pneumonia booster shot today.

## 2014-07-15 NOTE — Progress Notes (Signed)
Pre visit review using our clinic review tool, if applicable. No additional management support is needed unless otherwise documented below in the visit note. 

## 2014-07-16 ENCOUNTER — Encounter: Payer: Self-pay | Admitting: Internal Medicine

## 2014-07-16 ENCOUNTER — Telehealth: Payer: Self-pay | Admitting: Internal Medicine

## 2014-07-16 DIAGNOSIS — Z9079 Acquired absence of other genital organ(s): Secondary | ICD-10-CM | POA: Insufficient documentation

## 2014-07-16 DIAGNOSIS — Z7189 Other specified counseling: Secondary | ICD-10-CM | POA: Insufficient documentation

## 2014-07-16 DIAGNOSIS — F17210 Nicotine dependence, cigarettes, uncomplicated: Secondary | ICD-10-CM | POA: Insufficient documentation

## 2014-07-16 LAB — COMPREHENSIVE METABOLIC PANEL
ALBUMIN: 4.2 g/dL (ref 3.5–5.2)
ALK PHOS: 69 U/L (ref 39–117)
ALT: 10 U/L (ref 0–53)
AST: 15 U/L (ref 0–37)
BUN: 23 mg/dL (ref 6–23)
CO2: 26 mEq/L (ref 19–32)
Calcium: 9.8 mg/dL (ref 8.4–10.5)
Chloride: 105 mEq/L (ref 96–112)
Creatinine, Ser: 1.34 mg/dL (ref 0.40–1.50)
GFR: 55.84 mL/min — ABNORMAL LOW (ref >60.00)
Glucose, Bld: 97 mg/dL (ref 70–99)
POTASSIUM: 4.6 meq/L (ref 3.5–5.1)
SODIUM: 140 meq/L (ref 135–145)
Total Bilirubin: 0.8 mg/dL (ref 0.2–1.2)
Total Protein: 7.3 g/dL (ref 6.0–8.3)

## 2014-07-16 NOTE — Assessment & Plan Note (Signed)
Given prevnar today. Has had pneumonia shot. Declines flu and tetanus. Last colonoscopy in 2014 and wanted 3 year follow up which would be 2017. Has not had much preventative care and does not see doctors frequently.

## 2014-07-16 NOTE — Telephone Encounter (Signed)
Faxed labs

## 2014-07-16 NOTE — Assessment & Plan Note (Signed)
Going through old chart was 3.8 cm in 2014 and fall 2015 home check was 4.1 cm. Will recheck this fall. Does not meet criteria for intervention at this time. 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

## 2014-07-16 NOTE — Telephone Encounter (Signed)
Patient wife stated that Dr Morrison Old is requesting PSA result for Patient. [facility] Fax # [phone]

## 2014-07-16 NOTE — Progress Notes (Signed)
   Subjective:    Patient ID: Alexander Ellis, male    DOB: 11-29-1945, 69 y.o.   MRN: [id number]  HPI The patient is a 69 YO man who is coming in today for wellness. He is still struggling with constipation. Trying to take miralax but sometimes it makes him feel nauseous. It does help with the constipation though. He denies vomiting. Denies other new problems.   PMH, [facility], social history, allergies, medications, problem list reviewed and updated today.   Review of Systems  Constitutional: Negative for fever, activity change, appetite change, fatigue and unexpected weight change.  HENT: Negative.   Eyes: Negative.   Respiratory: Negative for cough, chest tightness, shortness of breath and wheezing.   Cardiovascular: Negative for chest pain, palpitations and leg swelling.  Gastrointestinal: Positive for nausea, constipation and abdominal distention. Negative for vomiting, abdominal pain, diarrhea and blood in stool.  Musculoskeletal: Negative.   Skin: Negative.   Neurological: Negative.   Psychiatric/Behavioral: Negative.       Objective:   Physical Exam  Constitutional: He is oriented to person, place, and time. He appears well-developed and well-nourished.  HENT:  Head: Normocephalic and atraumatic.  Eyes: EOM are normal.  Neck: Normal range of motion.  Cardiovascular: Normal rate and regular rhythm.   No murmur heard. Pulmonary/Chest: Breath sounds normal. No respiratory distress. He has no wheezes. He has no rales.  Abdominal: Soft. Bowel sounds are normal. He exhibits distension. There is no tenderness. There is no rebound and no guarding.  Musculoskeletal: He exhibits no edema.  Neurological: He is alert and oriented to person, place, and time. Coordination normal.  Skin: Skin is warm and dry.  Psychiatric: He has a normal mood and affect. His behavior is normal.   Filed Vitals:   07/15/14 1335  BP: 128/78  Pulse: 82  Temp: 98.3 F (36.8 C)  TempSrc: Oral  Resp: 14    Height: 5\' 9"  (1.753 m)  Weight: 173 lb 12.8 oz (78.835 kg)  SpO2: 94%      Assessment & Plan:  Prevnar given today.

## 2014-07-16 NOTE — Assessment & Plan Note (Signed)
Reminded about the risks and harms of smoking. He has tried quitting in the past and is open to trying again but patches and lozenges did not help and he does not want to try any kind of medicine.

## 2014-07-16 NOTE — Assessment & Plan Note (Signed)
Several years ago for prostate cancer. Has had no follow up since that time. Check PSA today.

## 2014-07-16 NOTE — Assessment & Plan Note (Signed)
Has been under good control off treatment and follows at [facility] (has not been in several years). 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

## 2014-07-16 NOTE — Assessment & Plan Note (Signed)
Is taking miralax now without good results. Was not able to try senokot as it was not covered by insurance. Offered linzess but due to high co-pay he wants to continue with only the miralax. Check Abd X-ray as he has not gone in several days and having some nausea.

## 2014-07-19 ENCOUNTER — Emergency Department (HOSPITAL_COMMUNITY)
Admission: EM | Admit: 2014-07-19 | Discharge: 2014-07-19 | Disposition: A | Discharge status: Home / Self Care | Payer: Medicare Other | Attending: Emergency Medicine | Admitting: Emergency Medicine

## 2014-07-19 ENCOUNTER — Emergency Department (HOSPITAL_COMMUNITY): Payer: Medicare Other

## 2014-07-19 ENCOUNTER — Encounter (HOSPITAL_COMMUNITY): Payer: Self-pay | Admitting: Emergency Medicine

## 2014-07-19 DIAGNOSIS — Z8546 Personal history of malignant neoplasm of prostate: Secondary | ICD-10-CM | POA: Insufficient documentation

## 2014-07-19 DIAGNOSIS — K5900 Constipation, unspecified: Secondary | ICD-10-CM | POA: Insufficient documentation

## 2014-07-19 DIAGNOSIS — Z880 Allergy status to penicillin: Secondary | ICD-10-CM | POA: Diagnosis not present

## 2014-07-19 DIAGNOSIS — Z720 Tobacco use: Secondary | ICD-10-CM | POA: Diagnosis not present

## 2014-07-19 DIAGNOSIS — Z8619 Personal history of other infectious and parasitic diseases: Secondary | ICD-10-CM | POA: Diagnosis not present

## 2014-07-19 LAB — I-STAT CHEM 8, ED
BUN: 25 mg/dL — AB (ref 6–23)
CHLORIDE: 101 mmol/L (ref 96–112)
Calcium, Ion: 1.16 mmol/L (ref 1.13–1.30)
Creatinine, Ser: 1.5 mg/dL — ABNORMAL HIGH (ref 0.50–1.35)
Glucose, Bld: 96 mg/dL (ref 70–99)
HCT: 50 % (ref 39.0–52.0)
Hemoglobin: 17 g/dL (ref 13.0–17.0)
Potassium: 4.7 mmol/L (ref 3.5–5.1)
SODIUM: 139 mmol/L (ref 135–145)
TCO2: 22 mmol/L (ref 0–100)

## 2014-07-19 MED ORDER — BISACODYL 10 MG RE SUPP
10.0000 mg | Freq: Once | RECTAL | Status: AC
  Administered 2014-07-19: 10 mg via RECTAL
  Filled 2014-07-19: qty 1

## 2014-07-19 MED ORDER — FLEET ENEMA 7-19 GM/118ML RE ENEM
1.0000 | ENEMA | Freq: Once | RECTAL | Status: AC
  Administered 2014-07-19: 1 via RECTAL
  Filled 2014-07-19: qty 1

## 2014-07-19 NOTE — ED Notes (Signed)
Pt c/o bloating and no BM x 1 week; pt sts N/V only when takes laxative

## 2014-07-19 NOTE — ED Notes (Signed)
Patient returned from X-ray 

## 2014-07-19 NOTE — Discharge Instructions (Signed)
Take Fiber One Cereal as directed Constipation Constipation is when a person has fewer than three bowel movements a week, has difficulty having a bowel movement, or has stools that are dry, hard, or larger than normal. As people grow older, constipation is more common. If you try to fix constipation with medicines that make you have a bowel movement (laxatives), the problem may get worse. Long-term laxative use may cause the muscles of the colon to become weak. A low-fiber diet, not taking in enough fluids, and taking certain medicines may make constipation worse.  CAUSES   Certain medicines, such as antidepressants, pain medicine, iron supplements, antacids, and water pills.   Certain diseases, such as diabetes, irritable bowel syndrome (IBS), thyroid disease, or depression.   Not drinking enough water.   Not eating enough fiber-rich foods.   Stress or travel.   Lack of physical activity or exercise.   Ignoring the urge to have a bowel movement.   Using laxatives too much.  SIGNS AND SYMPTOMS   Having fewer than three bowel movements a week.   Straining to have a bowel movement.   Having stools that are hard, dry, or larger than normal.   Feeling full or bloated.   Pain in the lower abdomen.   Not feeling relief after having a bowel movement.  DIAGNOSIS  Your health care provider will take a medical history and perform a physical exam. Further testing may be done for severe constipation. Some tests may include:  A barium enema X-ray to examine your rectum, colon, and, sometimes, your small intestine.   A sigmoidoscopy to examine your lower colon.   A colonoscopy to examine your entire colon. TREATMENT  Treatment will depend on the severity of your constipation and what is causing it. Some dietary treatments include drinking more fluids and eating more fiber-rich foods. Lifestyle treatments may include regular exercise. If these diet and lifestyle  recommendations do not help, your health care provider may recommend taking over-the-counter laxative medicines to help you have bowel movements. Prescription medicines may be prescribed if over-the-counter medicines do not work.  HOME CARE INSTRUCTIONS   Eat foods that have a lot of fiber, such as fruits, vegetables, whole grains, and beans.  Limit foods high in fat and processed sugars, such as french fries, hamburgers, cookies, candies, and soda.   A fiber supplement may be added to your diet if you cannot get enough fiber from foods.   Drink enough fluids to keep your urine clear or pale yellow.   Exercise regularly or as directed by your health care provider.   Go to the restroom when you have the urge to go. Do not hold it.   Only take over-the-counter or prescription medicines as directed by your health care provider. Do not take other medicines for constipation without talking to your health care provider first.  [place] IF:   You have bright red blood in your stool.   Your constipation lasts for more than 4 days or gets worse.   You have abdominal or rectal pain.   You have thin, pencil-like stools.   You have unexplained weight loss. MAKE SURE YOU:   Understand these instructions.  Will watch your condition.  Will get help right away if you are not doing well or get worse. Document Released: 02/06/2004 Document Revised: 05/15/2013 Document Reviewed: 02/19/2013 [facility] Patient Information 2015 [place], [place]. This information is not intended to replace advice given to you by your health care provider.  Make sure you discuss any questions you have with your health care provider. ° °

## 2014-07-19 NOTE — ED Provider Notes (Signed)
CSN: [id number]     Arrival date & time 07/19/14  1215 History   First MD Initiated Contact with Patient 07/19/14 1247     Chief Complaint  Patient presents with  . Constipation      HPI Patient presents with one week of constipation.  Had some nausea.  Has been trying Marily Memos lacks and magnesium citrate.  No definite vomiting.  Feels like he might have some bloating.  No fever chills. Past Medical History  Diagnosis Date  . Cancer 2014    prostate  . GERD (gastroesophageal reflux disease)   . Hepatitis    Past Surgical History  Procedure Laterality Date  . Appendectomy    . Tonsillectomy    . Back surgery      [phone]  . Prostatectomy  2012   Family History  Problem Relation Age of Onset  . Heart disease Mother   . Hypertension Mother    History  Substance Use Topics  . Smoking status: Current Every Day Smoker  . Smokeless tobacco: Not on file  . Alcohol Use: No    Review of Systems  All other systems reviewed and are negative  Allergies  Penicillins; Other; Codeine; and Oxycodone  Home Medications   Prior to Admission medications   Medication Sig Start Date End Date Taking? Authorizing Provider  polyethylene glycol powder (GLYCOLAX/MIRALAX) powder Take 34 g by mouth 2 (two) times daily as needed. 06/04/14  Yes Olga Millers, MD  senna-docusate (SENOKOT-S) 8.6-50 MG per tablet Take 1 tablet by mouth 2 (two) times daily. Patient not taking: Reported on 07/15/2014 06/04/14   Olga Millers, MD  sorbitol 70 % SOLN Take 75 mLs by mouth 2 (two) times daily as needed for moderate constipation. Patient not taking: Reported on 07/15/2014 06/04/14   Olga Millers, MD   BP 134/92 mmHg  Pulse 84  Temp(Src) 98.6 F (37 C) (Oral)  Resp 16  SpO2 95% Physical Exam  Constitutional: He is oriented to person, place, and time. He appears well-developed and well-nourished. No distress.  HENT:  Head: Normocephalic and atraumatic.  Eyes: Pupils are  equal, round, and reactive to light.  Neck: Normal range of motion.  Cardiovascular: Normal rate and intact distal pulses.   Pulmonary/Chest: No respiratory distress.  Abdominal: Normal appearance. He exhibits no distension. There is no tenderness. There is no rebound and no guarding.  Musculoskeletal: Normal range of motion.  Neurological: He is alert and oriented to person, place, and time. No cranial nerve deficit.  Skin: Skin is warm and dry. No rash noted.  Psychiatric: He has a normal mood and affect. His behavior is normal.  Nursing note and vitals reviewed.   ED Course  Procedures (including critical care time) Labs Review Labs Reviewed  I-STAT CHEM 8, ED - Abnormal; Notable for the following:    BUN 25 (*)    Creatinine, Ser 1.50 (*)    All other components within normal limits    Imaging Review Dg Abd Acute W/chest  07/19/2014   CLINICAL DATA:  Longstanding history of constipation  EXAM: ACUTE ABDOMEN SERIES (ABDOMEN 2 VIEW & CHEST 1 VIEW)  COMPARISON:  07/15/2014  FINDINGS: Cardiac shadow is within normal limits. Mild interstitial changes are seen bilaterally without focal infiltrate. Old rib fractures are noted on the left.  The abdomen shows scattered large and small bowel gas. Fecal material is noted within the colon but to a lesser degree than that seen on the prior exam. No obstructive  changes are seen. Aortic calcifications are noted. No acute bony abnormality is seen.  IMPRESSION: Fecal material throughout the colon although improved from the prior exam.  No other focal abnormality is noted.   Electronically Signed   By: Inez Catalina M.D.   On: 07/19/2014 14:00      Patient instructed to stop the milk thistle which can have bloating and constipation as a side effect.  Also instruct to increase his dietary fiber with fiber 1 cereal and products.  Return if he develops abdominal distention or vomiting. MDM   Final diagnoses:  Constipation        Dot Lanes,  MD 07/19/14 [id number]

## 2015-01-01 ENCOUNTER — Other Ambulatory Visit: Payer: Self-pay | Admitting: Internal Medicine

## 2015-01-01 ENCOUNTER — Telehealth: Payer: Self-pay | Admitting: Internal Medicine

## 2015-01-01 DIAGNOSIS — C61 Malignant neoplasm of prostate: Secondary | ICD-10-CM | POA: Insufficient documentation

## 2015-01-01 DIAGNOSIS — C7951 Secondary malignant neoplasm of bone: Principal | ICD-10-CM

## 2015-01-01 NOTE — Telephone Encounter (Signed)
Yes, it started in his prostate and went to his bone. I called the patient to verify.

## 2015-01-01 NOTE — Telephone Encounter (Signed)
Pt need referral to:  [place]: Eston Esters MD  Internist  Address: [address], [place], [place] [address]  Phone: [phone] Fax number [phone]   Asap pt has bone cancer and needs to get in.    Best number for [phone]

## 2015-01-01 NOTE — Telephone Encounter (Signed)
Does he have prostate cancer with metastases in the bone, or to see have some other type of cancer?

## 2015-01-02 ENCOUNTER — Telehealth: Payer: Self-pay | Admitting: Oncology

## 2015-01-02 NOTE — Telephone Encounter (Signed)
New patient appt-s/w patient and gave  np appt for 08/19 @ 1:30 w/Dr. Alen Blew.  Referring Dr. 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

## 2015-01-02 NOTE — Telephone Encounter (Signed)
Dr. Ronnald Ramp, are you able to make this referral for this patient?

## 2015-01-02 NOTE — Telephone Encounter (Signed)
Yes, I ordered this referral yesterday

## 2015-01-02 NOTE — Telephone Encounter (Signed)
Amy,  Please provide clarification. Patient's wife called to check on your conversation with the patient yesterday. He is stating that you told him that you were sending over the referral to Dr. Eston Esters yesterday, but they havent received anything. I am unclear of what step we are at based on the notes. Thank you,.

## 2015-01-02 NOTE — Telephone Encounter (Signed)
Patient aware.

## 2015-01-03 NOTE — Telephone Encounter (Signed)
Dr. Ronnald Ramp out of office today. Could you send referral when you get a chance?

## 2015-01-03 NOTE — Telephone Encounter (Signed)
pts wife called stating referral went to wrong place It should go to Dr. Rudell Cobb. Ennover Contact person there is [facility] Phone# [phone] Fax [phone]

## 2015-01-03 NOTE — Telephone Encounter (Signed)
Referral placed.

## 2015-01-08 ENCOUNTER — Telehealth: Payer: Self-pay | Admitting: Hematology & Oncology

## 2015-01-08 NOTE — Telephone Encounter (Signed)
Returned pt call regarding scheduling new pt appt. Request records from [facility] 8/12 and spoke with HIM dept on 8/16 regarding faxing records.  Left another mess with HIM dept on 8/17 with fax no.

## 2015-01-10 ENCOUNTER — Other Ambulatory Visit: Payer: Medicare Other

## 2015-01-10 ENCOUNTER — Ambulatory Visit: Payer: Medicare Other | Admitting: Oncology

## 2015-01-10 ENCOUNTER — Ambulatory Visit: Payer: Medicare Other

## 2015-01-20 ENCOUNTER — Other Ambulatory Visit (HOSPITAL_BASED_OUTPATIENT_CLINIC_OR_DEPARTMENT_OTHER): Payer: Medicare Other

## 2015-01-20 ENCOUNTER — Encounter: Payer: Self-pay | Admitting: Hematology & Oncology

## 2015-01-20 ENCOUNTER — Ambulatory Visit: Payer: Medicare Other

## 2015-01-20 ENCOUNTER — Ambulatory Visit (HOSPITAL_BASED_OUTPATIENT_CLINIC_OR_DEPARTMENT_OTHER): Payer: Medicare Other | Admitting: Hematology & Oncology

## 2015-01-20 VITALS — BP 128/78 | HR 68 | Temp 97.7°F | Resp 16 | Ht 69.0 in | Wt 165.0 lb

## 2015-01-20 DIAGNOSIS — C61 Malignant neoplasm of prostate: Secondary | ICD-10-CM

## 2015-01-20 DIAGNOSIS — C7951 Secondary malignant neoplasm of bone: Principal | ICD-10-CM

## 2015-01-20 LAB — CBC WITH DIFFERENTIAL (CANCER CENTER ONLY)
BASO#: 0.1 10*3/uL (ref 0.0–0.2)
BASO%: 0.5 % (ref 0.0–2.0)
EOS%: 0.9 % (ref 0.0–7.0)
Eosinophils Absolute: 0.1 10*3/uL (ref 0.0–0.5)
HCT: 43.5 % (ref 38.7–49.9)
HGB: 14.7 g/dL (ref 13.0–17.1)
LYMPH#: 2.9 10*3/uL (ref 0.9–3.3)
LYMPH%: 26.2 % (ref 14.0–48.0)
MCH: 32.5 pg (ref 28.0–33.4)
MCHC: 33.8 g/dL (ref 32.0–35.9)
MCV: 96 fL (ref 82–98)
MONO#: 0.9 10*3/uL (ref 0.1–0.9)
MONO%: 8.2 % (ref 0.0–13.0)
NEUT#: 7 10*3/uL — ABNORMAL HIGH (ref 1.5–6.5)
NEUT%: 64.2 % (ref 40.0–80.0)
Platelets: 235 10*3/uL (ref 145–400)
RBC: 4.52 10*6/uL (ref 4.20–5.70)
RDW: 13.7 % (ref 11.1–15.7)
WBC: 11 10*3/uL — ABNORMAL HIGH (ref 4.0–10.0)

## 2015-01-20 LAB — COMPREHENSIVE METABOLIC PANEL
ALT: 14 U/L (ref 9–46)
AST: 17 U/L (ref 10–35)
Albumin: 4 g/dL (ref 3.6–5.1)
Alkaline Phosphatase: 77 U/L (ref 40–115)
BUN: 16 mg/dL (ref 7–25)
CHLORIDE: 102 mmol/L (ref 98–110)
CO2: 26 mmol/L (ref 20–31)
CREATININE: 1.09 mg/dL (ref 0.70–1.18)
Calcium: 9.4 mg/dL (ref 8.6–10.3)
Glucose, Bld: 91 mg/dL (ref 65–99)
POTASSIUM: 4.5 mmol/L (ref 3.5–5.3)
SODIUM: 139 mmol/L (ref 135–146)
Total Bilirubin: 0.9 mg/dL (ref 0.2–1.2)
Total Protein: 7 g/dL (ref 6.1–8.1)

## 2015-01-20 LAB — CHCC SATELLITE - SMEAR

## 2015-01-20 NOTE — Progress Notes (Signed)
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 OTHER  : I live a lot closer to your office and would like to be taken care of here.  HPI: Mr. Fye is a really nice 69 year old white gentleman. He is history of prostate cancer dates back to 2012 when he had a PSA done. He is asymptomatic. He said his PSA was about 9. He subsequently underwent a prostatectomy. I think this may been a robotic prostatectomy. He had some bowel issues afterward.  His PSA declined.  Earlier this year, his PSA is found to be 49. He was referred to the urology clinic at [facility]. His PSA was still elevated at 54.  He had  some bone scans and MRI. 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 He had involvement of the thoracic and lumbar spine, ribs and iliac crest.  He is started on Eligard. He was given Casodex for 30 days.  His PSA was checked in May and this came down to 2.17.  It was recommended that he consider chemotherapy area and recent studies had shown some benefit to early introduction of docetaxel in high-risk metastatic prostate cancer. He refused this suggestion. He had had problems with cirrhosis. This may have been related to occupational exposures. However, they have cirrhosis seem to improve on its own.  Because he lives closer our office, it's much easier for him to come and see [place]. As such, we are seeing him today.  He looks like he is in great shape. He is not hurting. His appetite is good. He is not gaining weight. I told him that thought that weight loss probably was from muscle mass loss because of low testosterone. He has not had hot flashes or sweats.  He's had no problems with urine. His no urinary incontinence. He does have some chronic constipation.  He's had no cough. He's had no moccasins. He's had no bleeding. He's had no leg swelling.   He comes in with his wife and son and his son's wife. They are all very  nice.  Currently, his performance status is ECOG 0.    Past Medical History  Diagnosis Date  . Cancer 2014    prostate  . GERD (gastroesophageal reflux disease)   . Hepatitis   :  Past Surgical History  Procedure Laterality Date  . Appendectomy    . Tonsillectomy    . Back surgery      [phone]  . Prostatectomy  2012  :   Current outpatient prescriptions:  .  milk thistle 175 MG tablet, Take 175 mg by mouth daily., Disp: , Rfl:  .  OVER THE COUNTER MEDICATION, Take 2 capsules by mouth 2 (two) times daily., Disp: , Rfl: :  :  Allergies  Allergen Reactions  . Penicillins Hives  . Other Nausea Only and Other (See Comments)    Uncoded Allergy. Allergen: IV contrast  . Codeine Nausea And Vomiting  . Oxycodone Other (See Comments)    somnolence  :  Family History  Problem Relation Age of Onset  . Heart disease Mother   . Hypertension Mother   :  Social History   Social History  . Marital Status: Married    Spouse Name: N/A  . Number of Children: N/A  . Years of Education: N/A   Occupational History  . Not on file.   Social History Main Topics  . Smoking status: Current Every Day Smoker  .  Smokeless tobacco: Not on file     Comment: pack a day  . Alcohol Use: No  . Drug Use: No  . Sexual Activity: Not on file   Other Topics Concern  . Not on file   Social History Narrative  :  Pertinent items are noted in HPI.  Exam: @IPVITALS @  well-developed and well-nourished white gentleman in no obvious distress. Vital signs show a temperature of 97.7. Pulse 68. Blood pressure 128/78. Weight is 165 pounds. Head and neck exam shows no ocular or oral lesions. There are no palpable cervical or supraclavicular lymph nodes. Lungs are clear bilaterally. Cardiac exam regular rate and rhythm with no murmurs, rubs or bruits. Abdomen is soft. He has good bowel sounds. There is no fluid wave. There is no palpable liver or spleen tip. Skin exam shows no rashes,  ecchymoses or petechia. Back exam shows no tenderness over the spine, ribs or hips. Extremities shows no clubbing, cyanosis or edema. Neurological exam is nonfocal.    Recent Labs  01/20/15 1426  WBC 11.0*  HGB 14.7  HCT 43.5  PLT 235   No results for input(s): NA, K, CL, CO2, GLUCOSE, BUN, CREATININE, CALCIUM in the last 72 hours.  Blood smear review:  None  Pathology: None     Assessment and Plan:  Mr. Mullens is a 69 year old gentleman with metastatic hormone sensitive prostate cancer. He initially presented with a very high Gleason score of 9. I think this will ultimately drive his course. I think that he has a systemic and risk of resistance and castrate resistance quickly.  We are checking his PSA. We'll see what the PSA is. Hopefully, it will be lower.  If his PSA is higher, then I probably would repeat the bone scan. I'm unsure MRI would really help [place] out.  I would consider him for Casodex. I'm not sure as to why he only got 30 days of Casodex. I know that there is some trials looking at intermittent androgen depletion.  I told him he and his family that no matter what, he is not curable. He certainly is treatable but no matter what we do, eventually, his disease will be resistant.  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 He really is not interested in chemotherapy. I told him that we have options that we use before chemotherapy is employed.  For now, I will see about getting him back depending on his PSA.  He clearly needs Xgeva. He has not received this yet. I would think that Delton See would be very important to help with minimizing skeletal related events.  His last Eligard was back in February. This is every 6 months. I will make sure that he gets set up for this and the Xgeva in September.  I spent about 1 hour with he and his family. Answered all their questions.

## 2015-01-21 LAB — TESTOSTERONE: TESTOSTERONE: 38 ng/dL — AB (ref 300–890)

## 2015-01-21 LAB — RETICULOCYTES (CHCC)
ABS Retic: 50.2 10*3/uL (ref 19.0–186.0)
RBC.: 4.56 MIL/uL (ref 4.22–5.81)
RETIC CT PCT: 1.1 % (ref 0.4–2.3)

## 2015-01-21 LAB — PSA: PSA: 3.91 ng/mL (ref <=4.00)

## 2015-01-21 LAB — LACTATE DEHYDROGENASE: LDH: 159 U/L (ref 94–250)

## 2015-01-23 NOTE — Addendum Note (Signed)
Addended by: Burney Gauze R on: 01/23/2015 04:51 PM   Modules accepted: Orders

## 2015-01-24 ENCOUNTER — Telehealth: Payer: Self-pay | Admitting: Hematology & Oncology

## 2015-01-24 NOTE — Telephone Encounter (Signed)
Spoke with pt regarding appt on 9/7 for injection

## 2015-01-29 ENCOUNTER — Ambulatory Visit (HOSPITAL_BASED_OUTPATIENT_CLINIC_OR_DEPARTMENT_OTHER): Payer: Medicare Other

## 2015-01-29 VITALS — BP 127/79 | HR 79 | Temp 98.1°F

## 2015-01-29 DIAGNOSIS — C61 Malignant neoplasm of prostate: Secondary | ICD-10-CM

## 2015-01-29 DIAGNOSIS — Z5111 Encounter for antineoplastic chemotherapy: Secondary | ICD-10-CM

## 2015-01-29 DIAGNOSIS — C7951 Secondary malignant neoplasm of bone: Secondary | ICD-10-CM

## 2015-01-29 MED ORDER — DEGARELIX ACETATE 120 MG ~~LOC~~ SOLR
240.0000 mg | Freq: Once | SUBCUTANEOUS | Status: AC
  Administered 2015-01-29: 240 mg via SUBCUTANEOUS
  Filled 2015-01-29: qty 6

## 2015-01-29 MED ORDER — DENOSUMAB 120 MG/1.7ML ~~LOC~~ SOLN
120.0000 mg | Freq: Once | SUBCUTANEOUS | Status: AC
  Administered 2015-01-29: 120 mg via SUBCUTANEOUS
  Filled 2015-01-29: qty 1.7

## 2015-01-29 NOTE — Patient Instructions (Signed)
Denosumab injection What is this medicine? DENOSUMAB (den oh sue mab) slows bone breakdown. Prolia is used to treat osteoporosis in women after menopause and in men. Xgeva is used to prevent bone fractures and other bone problems caused by cancer bone metastases. Xgeva is also used to treat giant cell tumor of the bone. This medicine may be used for other purposes; ask your health care provider or pharmacist if you have questions. COMMON BRAND NAME(S): Prolia, XGEVA What should I tell my health care provider before I take this medicine? They need to know if you have any of these conditions: -dental disease -eczema -infection or history of infections -kidney disease or on dialysis -low blood calcium or vitamin D -malabsorption syndrome -scheduled to have surgery or tooth extraction -taking medicine that contains denosumab -thyroid or parathyroid disease -an unusual reaction to denosumab, other medicines, foods, dyes, or preservatives -pregnant or trying to get pregnant -breast-feeding How should I use this medicine? This medicine is for injection under the skin. It is given by a health care professional in a hospital or clinic setting. If you are getting Prolia, a special MedGuide will be given to you by the pharmacist with each prescription and refill. Be sure to read this information carefully each time. For Prolia, talk to your pediatrician regarding the use of this medicine in children. Special care may be needed. For Xgeva, talk to your pediatrician regarding the use of this medicine in children. While this drug may be prescribed for children as young as 13 years for selected conditions, precautions do apply. Overdosage: If you think you've taken too much of this medicine contact a poison control center or emergency room at once. Overdosage: If you think you have taken too much of this medicine contact a poison control center or emergency room at once. NOTE: This medicine is only for  you. Do not share this medicine with others. What if I miss a dose? It is important not to miss your dose. Call your doctor or health care professional if you are unable to keep an appointment. What may interact with this medicine? Do not take this medicine with any of the following medications: -other medicines containing denosumab This medicine may also interact with the following medications: -medicines that suppress the immune system -medicines that treat cancer -steroid medicines like prednisone or cortisone This list may not describe all possible interactions. Give your health care provider a list of all the medicines, herbs, non-prescription drugs, or dietary supplements you use. Also tell them if you smoke, drink alcohol, or use illegal drugs. Some items may interact with your medicine. What should I watch for while using this medicine? Visit your doctor or health care professional for regular checks on your progress. Your doctor or health care professional may order blood tests and other tests to see how you are doing. Call your doctor or health care professional if you get a cold or other infection while receiving this medicine. Do not treat yourself. This medicine may decrease your body's ability to fight infection. You should make sure you get enough calcium and vitamin D while you are taking this medicine, unless your doctor tells you not to. Discuss the foods you eat and the vitamins you take with your health care professional. See your dentist regularly. Brush and floss your teeth as directed. Before you have any dental work done, tell your dentist you are receiving this medicine. Do not become pregnant while taking this medicine or for 5 months after stopping   it. Women should inform their doctor if they wish to become pregnant or think they might be pregnant. There is a potential for serious side effects to an unborn child. Talk to your health care professional or pharmacist for more  information. What side effects may I notice from receiving this medicine? Side effects that you should report to your doctor or health care professional as soon as possible: -allergic reactions like skin rash, itching or hives, swelling of the face, lips, or tongue -breathing problems -chest pain -fast, irregular heartbeat -feeling faint or lightheaded, falls -fever, chills, or any other sign of infection -muscle spasms, tightening, or twitches -numbness or tingling -skin blisters or bumps, or is dry, peels, or red -slow healing or unexplained pain in the mouth or jaw -unusual bleeding or bruising Side effects that usually do not require medical attention (Report these to your doctor or health care professional if they continue or are bothersome.): -muscle pain -stomach upset, gas This list may not describe all possible side effects. Call your doctor for medical advice about side effects. You may report side effects to FDA at 1-800-FDA-1088. Where should I keep my medicine? This medicine is only given in a clinic, doctor's office, or other health care setting and will not be stored at home. NOTE: This sheet is a summary. It may not cover all possible information. If you have questions about this medicine, talk to your doctor, pharmacist, or health care provider.  2015, Elsevier/Gold Standard. (2011-11-08 12:37:47) Degarelix injection What is this medicine? DEGARELIX (deg a REL ix) is used to treat men with advanced prostate cancer. This medicine may be used for other purposes; ask your health care provider or pharmacist if you have questions. COMMON BRAND NAME(S): Degarelix, Mills Koller What should I tell my health care provider before I take this medicine? They need to know if you have any of these conditions: -heart disease -kidney disease -liver disease -low levels of potassium or magnesium in the blood -osteoporosis -an unusual or allergic reaction to degarelix, mannitol, other  medicines, foods, dyes, or preservatives -pregnant or trying to get pregnant -breast-feeding How should I use this medicine? This medicine is for injection under the skin. It is usually given by a health care professional in a hospital or clinic setting. If you get this medicine at home, you will be taught how to prepare and give this medicine. Use exactly as directed. Take your medicine at regular intervals. Do not take it more often than directed. It is important that you put your used needles and syringes in a special sharps container. Do not put them in a trash can. If you do not have a sharps container, call your pharmacist or healthcare provider to get one. Talk to your pediatrician regarding the use of this medicine in children. Special care may be needed. Overdosage: If you think you've taken too much of this medicine contact a poison control center or emergency room at once. Overdosage: If you think you have taken too much of this medicine contact a poison control center or emergency room at once. NOTE: This medicine is only for you. Do not share this medicine with others. What if I miss a dose? Try not to miss a dose. If you do miss a dose, call your doctor or health care professional for advice. What may interact with this medicine? Do not take this medicine with any of the following medications: -amiodarone -bretylium -disopyramide -dofetilide -droperidol -ibutilide -procainamide -quinidine -sotalol This list may not describe all  possible interactions. Give your health care provider a list of all the medicines, herbs, non-prescription drugs, or dietary supplements you use. Also tell them if you smoke, drink alcohol, or use illegal drugs. Some items may interact with your medicine. What should I watch for while using this medicine? Visit your doctor or health care professional for regular checks on your progress and discuss any issues before you start taking this medicine. Your  doctor or health care professional will need to monitor your hormone levels in your blood to check your response to treatment. Try to keep any appointments for testing. What side effects may I notice from receiving this medicine? Side effects that you should report to your doctor or health care professional as soon as possible: -allergic reactions like skin rash, itching or hives, swelling of the face, lips, or tongue -fever or chills -irregular heartbeat -nausea and vomiting along with severe abdominal pain -pain or difficulty passing urine -pelvic pain or bloating Side effects that usually do not require medical attention (Report these to your doctor or health care professional if they continue or are bothersome.): -change in sex drive or performance -constipation -headache -high blood pressure - hot flashes (flushing of skin, increased sweating) - itching, redness or mild pain at site where injected -joint pain -trouble sleeping -unusually weak or tired -weight gain This list may not describe all possible side effects. Call your doctor for medical advice about side effects. You may report side effects to FDA at 1-800-FDA-1088. Where should I keep my medicine? Keep out of the reach of children. This drug is usually given in a hospital or clinic and will not be stored at home. In rare cases, this medicine may be given at home. If you are using this medicine at home, you will be instructed on how to store this medicine. Throw away any unused medicine after the expiration date on the label. NOTE: This sheet is a summary. It may not cover all possible information. If you have questions about this medicine, talk to your doctor, pharmacist, or health care provider.  2015, Elsevier/Gold Standard. (2007-10-03 16:41:07)

## 2015-02-26 ENCOUNTER — Encounter: Payer: Self-pay | Admitting: Hematology & Oncology

## 2015-02-26 ENCOUNTER — Ambulatory Visit (HOSPITAL_BASED_OUTPATIENT_CLINIC_OR_DEPARTMENT_OTHER): Payer: Medicare Other

## 2015-02-26 ENCOUNTER — Ambulatory Visit (HOSPITAL_BASED_OUTPATIENT_CLINIC_OR_DEPARTMENT_OTHER): Payer: Medicare Other | Admitting: Hematology & Oncology

## 2015-02-26 ENCOUNTER — Other Ambulatory Visit (HOSPITAL_BASED_OUTPATIENT_CLINIC_OR_DEPARTMENT_OTHER): Payer: Medicare Other

## 2015-02-26 VITALS — BP 117/68 | HR 72 | Temp 97.8°F | Resp 14 | Ht 69.0 in | Wt 166.0 lb

## 2015-02-26 DIAGNOSIS — C7951 Secondary malignant neoplasm of bone: Secondary | ICD-10-CM

## 2015-02-26 DIAGNOSIS — C61 Malignant neoplasm of prostate: Secondary | ICD-10-CM

## 2015-02-26 DIAGNOSIS — Z5111 Encounter for antineoplastic chemotherapy: Secondary | ICD-10-CM | POA: Diagnosis present

## 2015-02-26 LAB — CBC WITH DIFFERENTIAL (CANCER CENTER ONLY)
BASO#: 0.1 10*3/uL (ref 0.0–0.2)
BASO%: 0.6 % (ref 0.0–2.0)
EOS%: 1.8 % (ref 0.0–7.0)
Eosinophils Absolute: 0.2 10*3/uL (ref 0.0–0.5)
HCT: 43.8 % (ref 38.7–49.9)
HEMOGLOBIN: 15.2 g/dL (ref 13.0–17.1)
LYMPH#: 2.3 10*3/uL (ref 0.9–3.3)
LYMPH%: 28 % (ref 14.0–48.0)
MCH: 32.8 pg (ref 28.0–33.4)
MCHC: 34.7 g/dL (ref 32.0–35.9)
MCV: 95 fL (ref 82–98)
MONO#: 0.8 10*3/uL (ref 0.1–0.9)
MONO%: 9.2 % (ref 0.0–13.0)
NEUT%: 60.4 % (ref 40.0–80.0)
NEUTROS ABS: 5 10*3/uL (ref 1.5–6.5)
Platelets: 247 10*3/uL (ref 145–400)
RBC: 4.63 10*6/uL (ref 4.20–5.70)
RDW: 13.9 % (ref 11.1–15.7)
WBC: 8.3 10*3/uL (ref 4.0–10.0)

## 2015-02-26 LAB — CMP (CANCER CENTER ONLY)
ALBUMIN: 3.8 g/dL (ref 3.3–5.5)
ALK PHOS: 80 U/L (ref 26–84)
ALT: 17 U/L (ref 10–47)
AST: 24 U/L (ref 11–38)
BILIRUBIN TOTAL: 0.9 mg/dL (ref 0.20–1.60)
BUN, Bld: 20 mg/dL (ref 7–22)
CALCIUM: 9.9 mg/dL (ref 8.0–10.3)
CO2: 28 mEq/L (ref 18–33)
Chloride: 103 mEq/L (ref 98–108)
Creat: 1.1 mg/dl (ref 0.6–1.2)
GLUCOSE: 90 mg/dL (ref 73–118)
POTASSIUM: 5.2 meq/L — AB (ref 3.3–4.7)
Sodium: 138 mEq/L (ref 128–145)
TOTAL PROTEIN: 7.6 g/dL (ref 6.4–8.1)

## 2015-02-26 MED ORDER — DENOSUMAB 120 MG/1.7ML ~~LOC~~ SOLN
120.0000 mg | Freq: Once | SUBCUTANEOUS | Status: AC
  Administered 2015-02-26: 120 mg via SUBCUTANEOUS
  Filled 2015-02-26: qty 1.7

## 2015-02-26 MED ORDER — DEGARELIX ACETATE 80 MG ~~LOC~~ SOLR
80.0000 mg | Freq: Once | SUBCUTANEOUS | Status: AC
  Administered 2015-02-26: 80 mg via SUBCUTANEOUS
  Filled 2015-02-26: qty 4

## 2015-02-26 NOTE — Progress Notes (Signed)
Hematology and Oncology Follow Up Visit  Alexander Ellis [id number] 24-Aug-1945 69 y.o. 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 We first saw him back in late August. At that point time, he had a PSA of 3.91.  His testosterone level was 38.  We went ahead and got him started on degarelix. It had been a while since he had received any type of androgen deprivation.  He feels great. He's had no problems with pain. He's had a good appetite. He's had no nausea or vomiting. He has had no change in bowel or bladder habits. He's had no leg swelling. He's had no rashes.  Overall, his performance status is ECOG 0.  Medications:  Current outpatient prescriptions:  .  Cholecalciferol (VITAMIN D-3) 1000 UNITS CAPS, Take 1 capsule by mouth daily., Disp: , Rfl:  .  milk thistle 175 MG tablet, Take 175 mg by mouth daily., Disp: , Rfl:  .  OVER THE COUNTER MEDICATION, Take 1 capsule by mouth 2 (two) times daily. , Disp: , Rfl:  .  vitamin B-12 (CYANOCOBALAMIN) 100 MCG tablet, Take 100 mcg by mouth daily., Disp: , Rfl:  .  vitamin C (ASCORBIC ACID) 500 MG tablet, Take 500 mg by mouth daily., Disp: , Rfl:   Allergies:  Allergies  Allergen Reactions  . Penicillins Hives  . Other Nausea Only and Other (See Comments)    Uncoded Allergy. Allergen: IV contrast  . Codeine Nausea And Vomiting  . Oxycodone Other (See Comments)    somnolence    Past Medical History, Surgical history, Social history, and Family History were reviewed and updated.  Review of Systems: As above  Physical Exam:  height is 5\' 9"  (1.753 m) and weight is 166 lb (75.297 kg). His oral temperature is 97.8 F (36.6 C). His blood pressure is 117/68 and his pulse is 72. His respiration is 14.   Wt Readings from Last  3 Encounters:  02/26/15 166 lb (75.297 kg)  01/20/15 165 lb (74.844 kg)  07/15/14 173 lb 12.8 oz (78.835 kg)     Well-developed and well-nourished white gentleman in no obvious distress. Head and neck exam shows no ocular or oral lesion. There are no palpable cervical or supraclavicular lymph nodes. Lungs are clear bilaterally. Cardiac exam regular rate and rhythm with no murmurs, rubs or bruits. Abdomen is soft. He has good bowel sounds. There is no fluid wave. There is no palpable liver or spleen tip. Back exam shows no tenderness over the spine, ribs or hips. Extremities shows no clubbing, cyanosis or edema. Neurological exam shows no focal neurological deficits.  Lab Results  Component Value Date   WBC 8.3 02/26/2015   HGB 15.2 02/26/2015   HCT 43.8 02/26/2015   MCV 95 02/26/2015   PLT 247 02/26/2015     Chemistry      Component Value Date/Time   NA 138 02/26/2015 1253   NA 139 01/20/2015 1427   K 5.2* 02/26/2015 1253   K 4.5 01/20/2015 1427   CL 103 02/26/2015 1253   CL 102 01/20/2015 1427   CO2 28 02/26/2015 1253   CO2 26 01/20/2015 1427   BUN 20 02/26/2015 1253   BUN 16 01/20/2015 1427   CREATININE 1.1 02/26/2015 1253   CREATININE 1.09 01/20/2015  1427      Component Value Date/Time   CALCIUM 9.9 02/26/2015 1253   CALCIUM 9.4 01/20/2015 1427   ALKPHOS 80 02/26/2015 1253   ALKPHOS 77 01/20/2015 1427   AST 24 02/26/2015 1253   AST 17 01/20/2015 1427   ALT 17 02/26/2015 1253   ALT 14 01/20/2015 1427   BILITOT 0.90 02/26/2015 1253   BILITOT 0.9 01/20/2015 1427         Impression and Plan: Alexander Ellis is a 69 year old gentleman with metastatic prostate cancer. I would have to believe that he is still hormone sensitive. We will see what his PSA is.  We have not yet had to add Casodex.  I don't see that we have to do any scans on him right now. He really feels good. He looks great. I'm not sure what scans will due for [place].  I will plan to get her back in 4-5 weeks.  I think we have some flexibility as to his scheduling since he is doing so well.  I spent about 25-30 minutes with he and his family. I reviewed the labs.   Volanda Napoleon, MD 10/5/20163:18 PM

## 2015-02-26 NOTE — Patient Instructions (Signed)
Denosumab injection What is this medicine? DENOSUMAB (den oh sue mab) slows bone breakdown. Prolia is used to treat osteoporosis in women after menopause and in men. Xgeva is used to prevent bone fractures and other bone problems caused by cancer bone metastases. Xgeva is also used to treat giant cell tumor of the bone. This medicine may be used for other purposes; ask your health care provider or pharmacist if you have questions. What should I tell my health care provider before I take this medicine? They need to know if you have any of these conditions: -dental disease -eczema -infection or history of infections -kidney disease or on dialysis -low blood calcium or vitamin D -malabsorption syndrome -scheduled to have surgery or tooth extraction -taking medicine that contains denosumab -thyroid or parathyroid disease -an unusual reaction to denosumab, other medicines, foods, dyes, or preservatives -pregnant or trying to get pregnant -breast-feeding How should I use this medicine? This medicine is for injection under the skin. It is given by a health care professional in a hospital or clinic setting. If you are getting Prolia, a special MedGuide will be given to you by the pharmacist with each prescription and refill. Be sure to read this information carefully each time. For Prolia, talk to your pediatrician regarding the use of this medicine in children. Special care may be needed. For Xgeva, talk to your pediatrician regarding the use of this medicine in children. While this drug may be prescribed for children as young as 13 years for selected conditions, precautions do apply. Overdosage: If you think you have taken too much of this medicine contact a poison control center or emergency room at once. NOTE: This medicine is only for you. Do not share this medicine with others. What if I miss a dose? It is important not to miss your dose. Call your doctor or health care professional if you are  unable to keep an appointment. What may interact with this medicine? Do not take this medicine with any of the following medications: -other medicines containing denosumab This medicine may also interact with the following medications: -medicines that suppress the immune system -medicines that treat cancer -steroid medicines like prednisone or cortisone This list may not describe all possible interactions. Give your health care provider a list of all the medicines, herbs, non-prescription drugs, or dietary supplements you use. Also tell them if you smoke, drink alcohol, or use illegal drugs. Some items may interact with your medicine. What should I watch for while using this medicine? Visit your doctor or health care professional for regular checks on your progress. Your doctor or health care professional may order blood tests and other tests to see how you are doing. Call your doctor or health care professional if you get a cold or other infection while receiving this medicine. Do not treat yourself. This medicine may decrease your body's ability to fight infection. You should make sure you get enough calcium and vitamin D while you are taking this medicine, unless your doctor tells you not to. Discuss the foods you eat and the vitamins you take with your health care professional. See your dentist regularly. Brush and floss your teeth as directed. Before you have any dental work done, tell your dentist you are receiving this medicine. Do not become pregnant while taking this medicine or for 5 months after stopping it. Women should inform their doctor if they wish to become pregnant or think they might be pregnant. There is a potential for serious side effects   to an unborn child. Talk to your health care professional or pharmacist for more information. What side effects may I notice from receiving this medicine? Side effects that you should report to your doctor or health care professional as soon as  possible: -allergic reactions like skin rash, itching or hives, swelling of the face, lips, or tongue -breathing problems -chest pain -fast, irregular heartbeat -feeling faint or lightheaded, falls -fever, chills, or any other sign of infection -muscle spasms, tightening, or twitches -numbness or tingling -skin blisters or bumps, or is dry, peels, or red -slow healing or unexplained pain in the mouth or jaw -unusual bleeding or bruising Side effects that usually do not require medical attention (Report these to your doctor or health care professional if they continue or are bothersome.): -muscle pain -stomach upset, gas This list may not describe all possible side effects. Call your doctor for medical advice about side effects. You may report side effects to FDA at 1-800-FDA-1088. Where should I keep my medicine? This medicine is only given in a clinic, doctor's office, or other health care setting and will not be stored at home. NOTE: This sheet is a summary. It may not cover all possible information. If you have questions about this medicine, talk to your doctor, pharmacist, or health care provider.    2016, [employer] Standard. (2011-11-08 12:37:47) Degarelix injection What is this medicine? DEGARELIX (deg a REL ix) is used to treat men with advanced prostate cancer. This medicine may be used for other purposes; ask your health care provider or pharmacist if you have questions. What should I tell my health care provider before I take this medicine? They need to know if you have any of these conditions: -heart disease -kidney disease -liver disease -low levels of potassium or magnesium in the blood -osteoporosis -an unusual or allergic reaction to degarelix, mannitol, other medicines, foods, dyes, or preservatives -pregnant or trying to get pregnant -breast-feeding How should I use this medicine? This medicine is for injection under the skin. It is usually given by a health  care professional in a hospital or clinic setting. If you get this medicine at home, you will be taught how to prepare and give this medicine. Use exactly as directed. Take your medicine at regular intervals. Do not take it more often than directed. It is important that you put your used needles and syringes in a special sharps container. Do not put them in a trash can. If you do not have a sharps container, call your pharmacist or healthcare provider to get one. Talk to your pediatrician regarding the use of this medicine in children. Special care may be needed. Overdosage: If you think you have taken too much of this medicine contact a poison control center or emergency room at once. NOTE: This medicine is only for you. Do not share this medicine with others. What if I miss a dose? Try not to miss a dose. If you do miss a dose, call your doctor or health care professional for advice. What may interact with this medicine? Do not take this medicine with any of the following medications: -amiodarone -bretylium -disopyramide -dofetilide -droperidol -ibutilide -procainamide -quinidine -sotalol This list may not describe all possible interactions. Give your health care provider a list of all the medicines, herbs, non-prescription drugs, or dietary supplements you use. Also tell them if you smoke, drink alcohol, or use illegal drugs. Some items may interact with your medicine. What should I watch for while using this medicine? Visit  your doctor or health care professional for regular checks on your progress and discuss any issues before you start taking this medicine. Do not rub or scratch injection site. There may be a lump at the injection site, or it may be red or sore for a few days after your dose. Your doctor or health care professional will need to monitor your hormone levels in your blood to check your response to treatment. Try to keep any appointments for testing. What side effects may I  notice from receiving this medicine? Side effects that you should report to your doctor or health care professional as soon as possible: -allergic reactions like skin rash, itching or hives, swelling of the face, lips, or tongue -fever or chills -irregular heartbeat -nausea and vomiting along with severe abdominal pain -pain or difficulty passing urine -pelvic pain or bloating Side effects that usually do not require medical attention (Report these to your doctor or health care professional if they continue or are bothersome.): -change in sex drive or performance -constipation -headache -high blood pressure - hot flashes (flushing of skin, increased sweating) - itching, redness or mild pain at site where injected -joint pain -trouble sleeping -unusually weak or tired -weight gain This list may not describe all possible side effects. Call your doctor for medical advice about side effects. You may report side effects to FDA at 1-800-FDA-1088. Where should I keep my medicine? Keep out of the reach of children. This drug is usually given in a hospital or clinic and will not be stored at home. In rare cases, this medicine may be given at home. If you are using this medicine at home, you will be instructed on how to store this medicine. Throw away any unused medicine after the expiration date on the label. NOTE: This sheet is a summary. It may not cover all possible information. If you have questions about this medicine, talk to your doctor, pharmacist, or health care provider.    2016, [employer]/Gold Standard. (2014-05-15 17:38:48)

## 2015-02-27 ENCOUNTER — Other Ambulatory Visit: Payer: Self-pay | Admitting: Hematology & Oncology

## 2015-02-27 DIAGNOSIS — C7951 Secondary malignant neoplasm of bone: Principal | ICD-10-CM

## 2015-02-27 DIAGNOSIS — C61 Malignant neoplasm of prostate: Secondary | ICD-10-CM

## 2015-02-27 LAB — PSA: PSA: 6.39 ng/mL — ABNORMAL HIGH (ref <=4.00)

## 2015-02-27 MED ORDER — BICALUTAMIDE 50 MG PO TABS: 50.0000 mg | ORAL_TABLET | Freq: Every day | ORAL | Status: DC

## 2015-04-04 ENCOUNTER — Ambulatory Visit (HOSPITAL_BASED_OUTPATIENT_CLINIC_OR_DEPARTMENT_OTHER): Payer: Medicare Other

## 2015-04-04 ENCOUNTER — Ambulatory Visit (HOSPITAL_BASED_OUTPATIENT_CLINIC_OR_DEPARTMENT_OTHER): Payer: Medicare Other | Admitting: Hematology & Oncology

## 2015-04-04 ENCOUNTER — Other Ambulatory Visit (HOSPITAL_BASED_OUTPATIENT_CLINIC_OR_DEPARTMENT_OTHER): Payer: Medicare Other

## 2015-04-04 ENCOUNTER — Encounter: Payer: Self-pay | Admitting: Hematology & Oncology

## 2015-04-04 VITALS — BP 115/67 | HR 77 | Temp 97.7°F | Wt 168.0 lb

## 2015-04-04 DIAGNOSIS — C7951 Secondary malignant neoplasm of bone: Secondary | ICD-10-CM

## 2015-04-04 DIAGNOSIS — C61 Malignant neoplasm of prostate: Secondary | ICD-10-CM | POA: Diagnosis present

## 2015-04-04 DIAGNOSIS — Z5111 Encounter for antineoplastic chemotherapy: Secondary | ICD-10-CM | POA: Diagnosis not present

## 2015-04-04 LAB — CBC WITH DIFFERENTIAL (CANCER CENTER ONLY)
BASO#: 0 10*3/uL (ref 0.0–0.2)
BASO%: 0.5 % (ref 0.0–2.0)
EOS%: 1.3 % (ref 0.0–7.0)
Eosinophils Absolute: 0.1 10*3/uL (ref 0.0–0.5)
HEMATOCRIT: 44.7 % (ref 38.7–49.9)
HGB: 15 g/dL (ref 13.0–17.1)
LYMPH#: 2.2 10*3/uL (ref 0.9–3.3)
LYMPH%: 25.9 % (ref 14.0–48.0)
MCH: 32 pg (ref 28.0–33.4)
MCHC: 33.6 g/dL (ref 32.0–35.9)
MCV: 95 fL (ref 82–98)
MONO#: 0.8 10*3/uL (ref 0.1–0.9)
MONO%: 9.6 % (ref 0.0–13.0)
NEUT#: 5.4 10*3/uL (ref 1.5–6.5)
NEUT%: 62.7 % (ref 40.0–80.0)
PLATELETS: 233 10*3/uL (ref 145–400)
RBC: 4.69 10*6/uL (ref 4.20–5.70)
RDW: 13.7 % (ref 11.1–15.7)
WBC: 8.6 10*3/uL (ref 4.0–10.0)

## 2015-04-04 LAB — CMP (CANCER CENTER ONLY)
ALT: 16 U/L (ref 10–47)
AST: 21 U/L (ref 11–38)
Albumin: 3.9 g/dL (ref 3.3–5.5)
Alkaline Phosphatase: 67 U/L (ref 26–84)
BILIRUBIN TOTAL: 1 mg/dL (ref 0.20–1.60)
BUN, Bld: 17 mg/dL (ref 7–22)
CALCIUM: 9.1 mg/dL (ref 8.0–10.3)
CO2: 25 meq/L (ref 18–33)
Chloride: 103 mEq/L (ref 98–108)
Creat: 1.1 mg/dl (ref 0.6–1.2)
GLUCOSE: 92 mg/dL (ref 73–118)
POTASSIUM: 5 meq/L — AB (ref 3.3–4.7)
Sodium: 140 mEq/L (ref 128–145)
Total Protein: 7.7 g/dL (ref 6.4–8.1)

## 2015-04-04 MED ORDER — DEGARELIX ACETATE 80 MG ~~LOC~~ SOLR
80.0000 mg | Freq: Once | SUBCUTANEOUS | Status: AC
  Administered 2015-04-04: 80 mg via SUBCUTANEOUS
  Filled 2015-04-04: qty 4

## 2015-04-04 MED ORDER — DENOSUMAB 120 MG/1.7ML ~~LOC~~ SOLN
120.0000 mg | Freq: Once | SUBCUTANEOUS | Status: AC
  Administered 2015-04-04: 120 mg via SUBCUTANEOUS
  Filled 2015-04-04: qty 1.7

## 2015-04-04 NOTE — Patient Instructions (Signed)
Denosumab injection What is this medicine? DENOSUMAB (den oh sue mab) slows bone breakdown. Prolia is used to treat osteoporosis in women after menopause and in men. Xgeva is used to prevent bone fractures and other bone problems caused by cancer bone metastases. Xgeva is also used to treat giant cell tumor of the bone. This medicine may be used for other purposes; ask your health care provider or pharmacist if you have questions. What should I tell my health care provider before I take this medicine? They need to know if you have any of these conditions: -dental disease -eczema -infection or history of infections -kidney disease or on dialysis -low blood calcium or vitamin D -malabsorption syndrome -scheduled to have surgery or tooth extraction -taking medicine that contains denosumab -thyroid or parathyroid disease -an unusual reaction to denosumab, other medicines, foods, dyes, or preservatives -pregnant or trying to get pregnant -breast-feeding How should I use this medicine? This medicine is for injection under the skin. It is given by a health care professional in a hospital or clinic setting. If you are getting Prolia, a special MedGuide will be given to you by the pharmacist with each prescription and refill. Be sure to read this information carefully each time. For Prolia, talk to your pediatrician regarding the use of this medicine in children. Special care may be needed. For Xgeva, talk to your pediatrician regarding the use of this medicine in children. While this drug may be prescribed for children as young as 13 years for selected conditions, precautions do apply. Overdosage: If you think you have taken too much of this medicine contact a poison control center or emergency room at once. NOTE: This medicine is only for you. Do not share this medicine with others. What if I miss a dose? It is important not to miss your dose. Call your doctor or health care professional if you are  unable to keep an appointment. What may interact with this medicine? Do not take this medicine with any of the following medications: -other medicines containing denosumab This medicine may also interact with the following medications: -medicines that suppress the immune system -medicines that treat cancer -steroid medicines like prednisone or cortisone This list may not describe all possible interactions. Give your health care provider a list of all the medicines, herbs, non-prescription drugs, or dietary supplements you use. Also tell them if you smoke, drink alcohol, or use illegal drugs. Some items may interact with your medicine. What should I watch for while using this medicine? Visit your doctor or health care professional for regular checks on your progress. Your doctor or health care professional may order blood tests and other tests to see how you are doing. Call your doctor or health care professional if you get a cold or other infection while receiving this medicine. Do not treat yourself. This medicine may decrease your body's ability to fight infection. You should make sure you get enough calcium and vitamin D while you are taking this medicine, unless your doctor tells you not to. Discuss the foods you eat and the vitamins you take with your health care professional. See your dentist regularly. Brush and floss your teeth as directed. Before you have any dental work done, tell your dentist you are receiving this medicine. Do not become pregnant while taking this medicine or for 5 months after stopping it. Women should inform their doctor if they wish to become pregnant or think they might be pregnant. There is a potential for serious side effects   to an unborn child. Talk to your health care professional or pharmacist for more information. What side effects may I notice from receiving this medicine? Side effects that you should report to your doctor or health care professional as soon as  possible: -allergic reactions like skin rash, itching or hives, swelling of the face, lips, or tongue -breathing problems -chest pain -fast, irregular heartbeat -feeling faint or lightheaded, falls -fever, chills, or any other sign of infection -muscle spasms, tightening, or twitches -numbness or tingling -skin blisters or bumps, or is dry, peels, or red -slow healing or unexplained pain in the mouth or jaw -unusual bleeding or bruising Side effects that usually do not require medical attention (Report these to your doctor or health care professional if they continue or are bothersome.): -muscle pain -stomach upset, gas This list may not describe all possible side effects. Call your doctor for medical advice about side effects. You may report side effects to FDA at 1-800-FDA-1088. Where should I keep my medicine? This medicine is only given in a clinic, doctor's office, or other health care setting and will not be stored at home. NOTE: This sheet is a summary. It may not cover all possible information. If you have questions about this medicine, talk to your doctor, pharmacist, or health care provider.    2016, [employer] Standard. (2011-11-08 12:37:47) Degarelix injection What is this medicine? DEGARELIX (deg a REL ix) is used to treat men with advanced prostate cancer. This medicine may be used for other purposes; ask your health care provider or pharmacist if you have questions. What should I tell my health care provider before I take this medicine? They need to know if you have any of these conditions: -heart disease -kidney disease -liver disease -low levels of potassium or magnesium in the blood -osteoporosis -an unusual or allergic reaction to degarelix, mannitol, other medicines, foods, dyes, or preservatives -pregnant or trying to get pregnant -breast-feeding How should I use this medicine? This medicine is for injection under the skin. It is usually given by a health  care professional in a hospital or clinic setting. If you get this medicine at home, you will be taught how to prepare and give this medicine. Use exactly as directed. Take your medicine at regular intervals. Do not take it more often than directed. It is important that you put your used needles and syringes in a special sharps container. Do not put them in a trash can. If you do not have a sharps container, call your pharmacist or healthcare provider to get one. Talk to your pediatrician regarding the use of this medicine in children. Special care may be needed. Overdosage: If you think you have taken too much of this medicine contact a poison control center or emergency room at once. NOTE: This medicine is only for you. Do not share this medicine with others. What if I miss a dose? Try not to miss a dose. If you do miss a dose, call your doctor or health care professional for advice. What may interact with this medicine? Do not take this medicine with any of the following medications: -amiodarone -bretylium -disopyramide -dofetilide -droperidol -ibutilide -procainamide -quinidine -sotalol This list may not describe all possible interactions. Give your health care provider a list of all the medicines, herbs, non-prescription drugs, or dietary supplements you use. Also tell them if you smoke, drink alcohol, or use illegal drugs. Some items may interact with your medicine. What should I watch for while using this medicine? Visit   your doctor or health care professional for regular checks on your progress and discuss any issues before you start taking this medicine. Do not rub or scratch injection site. There may be a lump at the injection site, or it may be red or sore for a few days after your dose. Your doctor or health care professional will need to monitor your hormone levels in your blood to check your response to treatment. Try to keep any appointments for testing. What side effects may I  notice from receiving this medicine? Side effects that you should report to your doctor or health care professional as soon as possible: -allergic reactions like skin rash, itching or hives, swelling of the face, lips, or tongue -fever or chills -irregular heartbeat -nausea and vomiting along with severe abdominal pain -pain or difficulty passing urine -pelvic pain or bloating Side effects that usually do not require medical attention (Report these to your doctor or health care professional if they continue or are bothersome.): -change in sex drive or performance -constipation -headache -high blood pressure - hot flashes (flushing of skin, increased sweating) - itching, redness or mild pain at site where injected -joint pain -trouble sleeping -unusually weak or tired -weight gain This list may not describe all possible side effects. Call your doctor for medical advice about side effects. You may report side effects to FDA at 1-800-FDA-1088. Where should I keep my medicine? Keep out of the reach of children. This drug is usually given in a hospital or clinic and will not be stored at home. In rare cases, this medicine may be given at home. If you are using this medicine at home, you will be instructed on how to store this medicine. Throw away any unused medicine after the expiration date on the label. NOTE: This sheet is a summary. It may not cover all possible information. If you have questions about this medicine, talk to your doctor, pharmacist, or health care provider.    2016, [employer] Standard. (2014-05-15 17:38:48)  

## 2015-04-04 NOTE — Progress Notes (Signed)
Hematology and Oncology Follow Up Visit  Alexander Ellis [id number] 16-Dec-1945 69 y.o. 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 We first saw him back in late August. At that point time, he had a PSA of 3.91.  His testosterone level was 38.  We last saw him in October, his PSA was up to 6.39. I subsequently added Casodex. He is doing well with Casodex. He's not had any toxicity from this. He's had no problems with nausea or vomiting. He's had no bone pain. He's had no change in bowel or bladder habits.  He is very active. He's had no problems with fatigue or weakness. He's had no cough. He's had no nausea or vomiting. He has had no leg swelling.  He's had no rashes.  He has noticed some incontinence. He said this happened after he had his prostate removed. It got better. I suspect that he probably has some muscle laxity from the low testosterone. I offered him the opportunity to go to bladder physical therapy but he says this does not bother him all that much.  Overall, his performance status is ECOG 0.  Medications:  Current outpatient prescriptions:  .  bicalutamide (CASODEX) 50 MG tablet, Take 1 tablet (50 mg total) by mouth daily., Disp: 30 tablet, Rfl: 12 .  Cholecalciferol (VITAMIN D-3) 1000 UNITS CAPS, Take 1 capsule by mouth daily., Disp: , Rfl:  .  milk thistle 175 MG tablet, Take 175 mg by mouth daily., Disp: , Rfl:  .  OVER THE COUNTER MEDICATION, Take 1 capsule by mouth 2 (two) times daily. , Disp: , Rfl:  .  vitamin B-12 (CYANOCOBALAMIN) 100 MCG tablet, Take 100 mcg by mouth daily., Disp: , Rfl:  .  vitamin C (ASCORBIC ACID) 500 MG tablet, Take 500 mg by mouth daily., Disp: , Rfl:   Allergies:  Allergies  Allergen Reactions    . Penicillins Hives  . Other Nausea Only and Other (See Comments)    Uncoded Allergy. Allergen: IV contrast  . Codeine Nausea And Vomiting  . Oxycodone Other (See Comments)    somnolence    Past Medical History, Surgical history, Social history, and Family History were reviewed and updated.  Review of Systems: As above  Physical Exam:  weight is 168 lb (76.204 kg). His oral temperature is 97.7 F (36.5 C). His blood pressure is 115/67 and his pulse is 77.   Wt Readings from Last 3 Encounters:  04/04/15 168 lb (76.204 kg)  02/26/15 166 lb (75.297 kg)  01/20/15 165 lb (74.844 kg)     Well-developed and well-nourished white gentleman in no obvious distress. Head and neck exam shows no ocular or oral lesion. There are no palpable cervical or supraclavicular lymph nodes. Lungs are clear bilaterally. Cardiac exam regular rate and rhythm with no murmurs, rubs or bruits. Abdomen is soft. He has good bowel sounds. There is no fluid wave. There is no palpable liver or spleen tip. Back exam shows no tenderness over the spine, ribs or hips. Extremities shows no clubbing, cyanosis or edema. Neurological exam shows no focal neurological deficits.  Lab Results  Component Value Date   WBC 8.6 04/04/2015   HGB 15.0 04/04/2015   HCT 44.7 04/04/2015  MCV 95 04/04/2015   PLT 233 04/04/2015     Chemistry      Component Value Date/Time   NA 140 04/04/2015 1038   NA 139 01/20/2015 1427   K 5.0* 04/04/2015 1038   K 4.5 01/20/2015 1427   CL 103 04/04/2015 1038   CL 102 01/20/2015 1427   CO2 25 04/04/2015 1038   CO2 26 01/20/2015 1427   BUN 17 04/04/2015 1038   BUN 16 01/20/2015 1427   CREATININE 1.1 04/04/2015 1038   CREATININE 1.09 01/20/2015 1427      Component Value Date/Time   CALCIUM 9.1 04/04/2015 1038   CALCIUM 9.4 01/20/2015 1427   ALKPHOS 67 04/04/2015 1038   ALKPHOS 77 01/20/2015 1427   AST 21 04/04/2015 1038   AST 17 01/20/2015 1427   ALT 16 04/04/2015 1038   ALT 14  01/20/2015 1427   BILITOT 1.00 04/04/2015 1038   BILITOT 0.9 01/20/2015 1427         Impression and Plan: Alexander Ellis is a 69 year old gentleman with metastatic prostate cancer. Hopefully, with the addition of Casodex, we will find his PSA is going down.  It is hard to say if he is actually castrate resistant. His testosterone levels have been on the low side. 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  We will go ahead with his injections today.  I will plan to get him back in one more month.   Volanda Napoleon, MD 11/11/201611:51 AM

## 2015-04-05 LAB — PSA: PSA: 12.72 ng/mL — ABNORMAL HIGH (ref <=4.00)

## 2015-04-14 ENCOUNTER — Other Ambulatory Visit: Payer: Self-pay | Admitting: Hematology & Oncology

## 2015-04-14 DIAGNOSIS — C7951 Secondary malignant neoplasm of bone: Principal | ICD-10-CM

## 2015-04-14 DIAGNOSIS — C61 Malignant neoplasm of prostate: Secondary | ICD-10-CM

## 2015-04-22 ENCOUNTER — Other Ambulatory Visit: Payer: Self-pay | Admitting: Nurse Practitioner

## 2015-04-22 MED ORDER — PROCHLORPERAZINE MALEATE 10 MG PO TABS: 10.0000 mg | ORAL_TABLET | Freq: Once | ORAL | Status: DC

## 2015-04-22 MED ORDER — LORAZEPAM 0.5 MG PO TABS: 0.5000 mg | ORAL_TABLET | Freq: Once | ORAL | Status: DC

## 2015-04-23 ENCOUNTER — Ambulatory Visit (HOSPITAL_COMMUNITY)
Admission: RE | Admit: 2015-04-23 | Discharge: 2015-04-23 | Disposition: A | Discharge status: Home / Self Care | Payer: Medicare Other | Source: Ambulatory Visit | Attending: Hematology & Oncology | Admitting: Hematology & Oncology

## 2015-04-23 ENCOUNTER — Encounter (HOSPITAL_COMMUNITY)
Admission: RE | Admit: 2015-04-23 | Discharge: 2015-04-23 | Disposition: A | Discharge status: Home / Self Care | Payer: Medicare Other | Source: Ambulatory Visit | Attending: Hematology & Oncology | Admitting: Hematology & Oncology

## 2015-04-23 ENCOUNTER — Encounter (HOSPITAL_COMMUNITY): Payer: Self-pay

## 2015-04-23 DIAGNOSIS — M899 Disorder of bone, unspecified: Secondary | ICD-10-CM | POA: Insufficient documentation

## 2015-04-23 DIAGNOSIS — C7951 Secondary malignant neoplasm of bone: Secondary | ICD-10-CM | POA: Diagnosis present

## 2015-04-23 DIAGNOSIS — J984 Other disorders of lung: Secondary | ICD-10-CM | POA: Diagnosis not present

## 2015-04-23 DIAGNOSIS — R590 Localized enlarged lymph nodes: Secondary | ICD-10-CM | POA: Diagnosis not present

## 2015-04-23 DIAGNOSIS — I2510 Atherosclerotic heart disease of native coronary artery without angina pectoris: Secondary | ICD-10-CM | POA: Diagnosis not present

## 2015-04-23 DIAGNOSIS — R918 Other nonspecific abnormal finding of lung field: Secondary | ICD-10-CM | POA: Diagnosis not present

## 2015-04-23 DIAGNOSIS — I700 Atherosclerosis of aorta: Secondary | ICD-10-CM | POA: Diagnosis not present

## 2015-04-23 DIAGNOSIS — C61 Malignant neoplasm of prostate: Secondary | ICD-10-CM | POA: Diagnosis not present

## 2015-04-23 DIAGNOSIS — I714 Abdominal aortic aneurysm, without rupture: Secondary | ICD-10-CM | POA: Diagnosis not present

## 2015-04-23 MED ORDER — IOHEXOL 300 MG/ML  SOLN
100.0000 mL | Freq: Once | INTRAMUSCULAR | Status: AC | PRN
  Administered 2015-04-23: 100 mL via INTRAVENOUS

## 2015-04-23 MED ORDER — TECHNETIUM TC 99M MEDRONATE IV KIT
25.0000 | PACK | Freq: Once | INTRAVENOUS | Status: AC | PRN
  Administered 2015-04-23: 25 via INTRAVENOUS

## 2015-05-02 ENCOUNTER — Encounter: Payer: Self-pay | Admitting: Hematology & Oncology

## 2015-05-02 ENCOUNTER — Other Ambulatory Visit (HOSPITAL_BASED_OUTPATIENT_CLINIC_OR_DEPARTMENT_OTHER): Payer: Medicare Other

## 2015-05-02 ENCOUNTER — Ambulatory Visit (HOSPITAL_BASED_OUTPATIENT_CLINIC_OR_DEPARTMENT_OTHER): Payer: Medicare Other | Admitting: Hematology & Oncology

## 2015-05-02 ENCOUNTER — Ambulatory Visit (HOSPITAL_BASED_OUTPATIENT_CLINIC_OR_DEPARTMENT_OTHER): Payer: Medicare Other

## 2015-05-02 VITALS — BP 126/65 | HR 70 | Temp 97.7°F | Resp 16 | Ht 69.0 in | Wt 167.0 lb

## 2015-05-02 DIAGNOSIS — C61 Malignant neoplasm of prostate: Secondary | ICD-10-CM

## 2015-05-02 DIAGNOSIS — C7951 Secondary malignant neoplasm of bone: Secondary | ICD-10-CM | POA: Diagnosis not present

## 2015-05-02 DIAGNOSIS — Z5111 Encounter for antineoplastic chemotherapy: Secondary | ICD-10-CM | POA: Diagnosis not present

## 2015-05-02 LAB — CBC WITH DIFFERENTIAL (CANCER CENTER ONLY)
BASO#: 0.1 10*3/uL (ref 0.0–0.2)
BASO%: 0.6 % (ref 0.0–2.0)
EOS ABS: 0.2 10*3/uL (ref 0.0–0.5)
EOS%: 1.6 % (ref 0.0–7.0)
HEMATOCRIT: 45.6 % (ref 38.7–49.9)
HEMOGLOBIN: 15.2 g/dL (ref 13.0–17.1)
LYMPH#: 2.7 10*3/uL (ref 0.9–3.3)
LYMPH%: 25.9 % (ref 14.0–48.0)
MCH: 31.6 pg (ref 28.0–33.4)
MCHC: 33.3 g/dL (ref 32.0–35.9)
MCV: 95 fL (ref 82–98)
MONO#: 0.8 10*3/uL (ref 0.1–0.9)
MONO%: 7.4 % (ref 0.0–13.0)
NEUT%: 64.5 % (ref 40.0–80.0)
NEUTROS ABS: 6.7 10*3/uL — AB (ref 1.5–6.5)
Platelets: 247 10*3/uL (ref 145–400)
RBC: 4.81 10*6/uL (ref 4.20–5.70)
RDW: 13.7 % (ref 11.1–15.7)
WBC: 10.4 10*3/uL — AB (ref 4.0–10.0)

## 2015-05-02 LAB — COMPREHENSIVE METABOLIC PANEL (CC13)
ALBUMIN: 4 g/dL (ref 3.6–5.1)
ALT: 11 U/L (ref 9–46)
AST: 17 U/L (ref 10–35)
Alkaline Phosphatase: 57 U/L (ref 40–115)
BILIRUBIN TOTAL: 0.7 mg/dL (ref 0.2–1.2)
BUN: 14 mg/dL (ref 7–25)
CALCIUM: 9.5 mg/dL (ref 8.6–10.3)
CHLORIDE: 104 mmol/L (ref 98–110)
CO2: 24 mmol/L (ref 20–31)
CREATININE: 1.17 mg/dL (ref 0.70–1.18)
Glucose, Bld: 84 mg/dL (ref 65–99)
Potassium: 5.1 mmol/L (ref 3.5–5.3)
SODIUM: 140 mmol/L (ref 135–146)
TOTAL PROTEIN: 7.4 g/dL (ref 6.1–8.1)

## 2015-05-02 MED ORDER — DEGARELIX ACETATE 80 MG ~~LOC~~ SOLR
80.0000 mg | Freq: Once | SUBCUTANEOUS | Status: AC
  Administered 2015-05-02: 80 mg via SUBCUTANEOUS
  Filled 2015-05-02: qty 4

## 2015-05-02 MED ORDER — DENOSUMAB 120 MG/1.7ML ~~LOC~~ SOLN
120.0000 mg | Freq: Once | SUBCUTANEOUS | Status: AC
  Administered 2015-05-02: 120 mg via SUBCUTANEOUS
  Filled 2015-05-02: qty 1.7

## 2015-05-02 NOTE — Patient Instructions (Signed)
Degarelix injection  What is this medicine?  DEGARELIX (deg a REL ix) is used to treat men with advanced prostate cancer.  This medicine may be used for other purposes; ask your health care provider or pharmacist if you have questions.  What should I tell my health care provider before I take this medicine?  They need to know if you have any of these conditions:  -heart disease  -kidney disease  -liver disease  -low levels of potassium or magnesium in the blood  -osteoporosis  -an unusual or allergic reaction to degarelix, mannitol, other medicines, foods, dyes, or preservatives  -pregnant or trying to get pregnant  -breast-feeding  How should I use this medicine?  This medicine is for injection under the skin. It is usually given by a health care professional in a hospital or clinic setting.  If you get this medicine at home, you will be taught how to prepare and give this medicine. Use exactly as directed. Take your medicine at regular intervals. Do not take it more often than directed.  It is important that you put your used needles and syringes in a special sharps container. Do not put them in a trash can. If you do not have a sharps container, call your pharmacist or healthcare provider to get one.  Talk to your pediatrician regarding the use of this medicine in children. Special care may be needed.  Overdosage: If you think you have taken too much of this medicine contact a poison control center or emergency room at once.  NOTE: This medicine is only for you. Do not share this medicine with others.  What if I miss a dose?  Try not to miss a dose. If you do miss a dose, call your doctor or health care professional for advice.  What may interact with this medicine?  Do not take this medicine with any of the following medications:  -amiodarone  -bretylium  -disopyramide  -dofetilide  -droperidol  -ibutilide  -procainamide  -quinidine  -sotalol  This list may not describe all possible interactions. Give your  health care provider a list of all the medicines, herbs, non-prescription drugs, or dietary supplements you use. Also tell them if you smoke, drink alcohol, or use illegal drugs. Some items may interact with your medicine.  What should I watch for while using this medicine?  Visit your doctor or health care professional for regular checks on your progress and discuss any issues before you start taking this medicine.  Do not rub or scratch injection site. There may be a lump at the injection site, or it may be red or sore for a few days after your dose.  Your doctor or health care professional will need to monitor your hormone levels in your blood to check your response to treatment. Try to keep any appointments for testing.  What side effects may I notice from receiving this medicine?  Side effects that you should report to your doctor or health care professional as soon as possible:  -allergic reactions like skin rash, itching or hives, swelling of the face, lips, or tongue  -fever or chills  -irregular heartbeat  -nausea and vomiting along with severe abdominal pain  -pain or difficulty passing urine  -pelvic pain or bloating  Side effects that usually do not require medical attention (Report these to your doctor or health care professional if they continue or are bothersome.):  -change in sex drive or performance  -constipation  -headache  -high blood pressure  -   to FDA at 1-800-FDA-1088. Where should I keep my medicine? Keep out of the reach of children. This drug is usually given in a hospital or clinic and will not be stored at home. In rare cases, this medicine may be given at home. If you are  using this medicine at home, you will be instructed on how to store this medicine. Throw away any unused medicine after the expiration date on the label. NOTE: This sheet is a summary. It may not cover all possible information. If you have questions about this medicine, talk to your doctor, pharmacist, or health care provider.    2016, Elsevier/Gold Standard. (2014-05-15 17:38:48) Denosumab injection What is this medicine? DENOSUMAB (den oh sue mab) slows bone breakdown. Prolia is used to treat osteoporosis in women after menopause and in men. Delton See is used to prevent bone fractures and other bone problems caused by cancer bone metastases. Delton See is also used to treat giant cell tumor of the bone. This medicine may be used for other purposes; ask your health care provider or pharmacist if you have questions. What should I tell my health care provider before I take this medicine? They need to know if you have any of these conditions: -dental disease -eczema -infection or history of infections -kidney disease or on dialysis -low blood calcium or vitamin D -malabsorption syndrome -scheduled to have surgery or tooth extraction -taking medicine that contains denosumab -thyroid or parathyroid disease -an unusual reaction to denosumab, other medicines, foods, dyes, or preservatives -pregnant or trying to get pregnant -breast-feeding How should I use this medicine? This medicine is for injection under the skin. It is given by a health care professional in a hospital or clinic setting. If you are getting Prolia, a special MedGuide will be given to you by the pharmacist with each prescription and refill. Be sure to read this information carefully each time. For Prolia, talk to your pediatrician regarding the use of this medicine in children. Special care may be needed. For Delton See, talk to your pediatrician regarding the use of this medicine in children. While this drug may be prescribed for children as  young as 13 years for selected conditions, precautions do apply. Overdosage: If you think you have taken too much of this medicine contact a poison control center or emergency room at once. NOTE: This medicine is only for you. Do not share this medicine with others. What if I miss a dose? It is important not to miss your dose. Call your doctor or health care professional if you are unable to keep an appointment. What may interact with this medicine? Do not take this medicine with any of the following medications: -other medicines containing denosumab This medicine may also interact with the following medications: -medicines that suppress the immune system -medicines that treat cancer -steroid medicines like prednisone or cortisone This list may not describe all possible interactions. Give your health care provider a list of all the medicines, herbs, non-prescription drugs, or dietary supplements you use. Also tell them if you smoke, drink alcohol, or use illegal drugs. Some items may interact with your medicine. What should I watch for while using this medicine? Visit your doctor or health care professional for regular checks on your progress. Your doctor or health care professional may order blood tests and other tests to see how you are doing. Call your doctor or health care professional if you get a cold or other infection while receiving this medicine. Do not treat yourself. This medicine may  decrease your body's ability to fight infection. You should make sure you get enough calcium and vitamin D while you are taking this medicine, unless your doctor tells you not to. Discuss the foods you eat and the vitamins you take with your health care professional. See your dentist regularly. Brush and floss your teeth as directed. Before you have any dental work done, tell your dentist you are receiving this medicine. Do not become pregnant while taking this medicine or for 5 months after stopping it.  Women should inform their doctor if they wish to become pregnant or think they might be pregnant. There is a potential for serious side effects to an unborn child. Talk to your health care professional or pharmacist for more information. What side effects may I notice from receiving this medicine? Side effects that you should report to your doctor or health care professional as soon as possible: -allergic reactions like skin rash, itching or hives, swelling of the face, lips, or tongue -breathing problems -chest pain -fast, irregular heartbeat -feeling faint or lightheaded, falls -fever, chills, or any other sign of infection -muscle spasms, tightening, or twitches -numbness or tingling -skin blisters or bumps, or is dry, peels, or red -slow healing or unexplained pain in the mouth or jaw -unusual bleeding or bruising Side effects that usually do not require medical attention (Report these to your doctor or health care professional if they continue or are bothersome.): -muscle pain -stomach upset, gas This list may not describe all possible side effects. Call your doctor for medical advice about side effects. You may report side effects to FDA at 1-800-FDA-1088. Where should I keep my medicine? This medicine is only given in a clinic, doctor's office, or other health care setting and will not be stored at home. NOTE: This sheet is a summary. It may not cover all possible information. If you have questions about this medicine, talk to your doctor, pharmacist, or health care provider.    2016, Elsevier/Gold Standard. (2011-11-08 12:37:47)

## 2015-05-02 NOTE — Progress Notes (Signed)
Hematology and Oncology Follow Up Visit  BERNEY PUETT [id number] 07/31/1945 69 y.o. 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 Lassman is back for follow-up. Unfortunately, I think it is clear that he is castrate resistant. His PSA he's going up. We last saw him in November, his PSA is up to 12.7. It just keeps going up.  We did go ahead and get scans on him. The bone scan, I thought, was very helpful. He clearly has progressive bone metastases. He has no obvious parenchymal disease his organs. He has a couple mediastinal lymph nodes but nothing that really is pathologic.  He is having some discomfort in his hips and mid back. He still able to do what he wishes.  He's had no problems with bowels or bladder. His appetite has been pretty good. He's had no bleeding or bruising. He's had no fever. He's had no cough or shortness of breath.  Overall, his performance status is ECOG 0.  Medications:  Current outpatient prescriptions:  .  bicalutamide (CASODEX) 50 MG tablet, Take 1 tablet (50 mg total) by mouth daily., Disp: 30 tablet, Rfl: 12 .  Cholecalciferol (VITAMIN D-3) 1000 UNITS CAPS, Take 1 capsule by mouth daily., Disp: , Rfl:  .  LORazepam (ATIVAN) 0.5 MG tablet, Take 1 tablet (0.5 mg total) by mouth once. Take 30 min to an hour prior to CT scan., Disp: 1 tablet, Rfl: 0 .  milk thistle 175 MG tablet, Take 175 mg by mouth daily., Disp: , Rfl:  .  OVER THE COUNTER MEDICATION, Take 1 capsule by mouth 2 (two) times daily. , Disp: , Rfl:  .  prochlorperazine (COMPAZINE) 10 MG tablet, Take 1 tablet (10 mg total) by mouth once. Take 1 tablet 1 hr prior to CT scan and may repeat 1 tablet post scan if nauseated., Disp: 2 tablet, Rfl: 0 .  vitamin B-12 (CYANOCOBALAMIN) 100 MCG tablet, Take 100 mcg by mouth  daily., Disp: , Rfl:  .  vitamin C (ASCORBIC ACID) 500 MG tablet, Take 500 mg by mouth daily., Disp: , Rfl:   Allergies:  Allergies  Allergen Reactions  . Penicillins Hives  . Other Nausea Only and Other (See Comments)    Uncoded Allergy. Allergen: IV contrast  . Codeine Nausea And Vomiting  . Oxycodone Other (See Comments)    somnolence    Past Medical History, Surgical history, Social history, and Family History were reviewed and updated.  Review of Systems: As above  Physical Exam:  height is 5\' 9"  (1.753 m) and weight is 167 lb (75.751 kg). His oral temperature is 97.7 F (36.5 C). His blood pressure is 126/65 and his pulse is 70. His respiration is 16.   Wt Readings from Last 3 Encounters:  05/02/15 167 lb (75.751 kg)  04/04/15 168 lb (76.204 kg)  02/26/15 166 lb (75.297 kg)     Well-developed and well-nourished white gentleman in no obvious distress. Head and neck exam shows no ocular or oral lesion. There are no palpable cervical or supraclavicular lymph nodes. Lungs are clear bilaterally. Cardiac exam regular rate and rhythm with no murmurs, rubs or bruits. Abdomen is soft. He has good bowel sounds. There is no fluid wave. There is no palpable liver or spleen tip. Back exam shows no tenderness over the spine, ribs  or hips. Extremities shows no clubbing, cyanosis or edema. Neurological exam shows no focal neurological deficits.  Lab Results  Component Value Date   WBC 10.4* 05/02/2015   HGB 15.2 05/02/2015   HCT 45.6 05/02/2015   MCV 95 05/02/2015   PLT 247 05/02/2015     Chemistry      Component Value Date/Time   NA 140 04/04/2015 1038   NA 139 01/20/2015 1427   K 5.0* 04/04/2015 1038   K 4.5 01/20/2015 1427   CL 103 04/04/2015 1038   CL 102 01/20/2015 1427   CO2 25 04/04/2015 1038   CO2 26 01/20/2015 1427   BUN 17 04/04/2015 1038   BUN 16 01/20/2015 1427   CREATININE 1.1 04/04/2015 1038   CREATININE 1.09 01/20/2015 1427      Component Value Date/Time     CALCIUM 9.1 04/04/2015 1038   CALCIUM 9.4 01/20/2015 1427   ALKPHOS 67 04/04/2015 1038   ALKPHOS 77 01/20/2015 1427   AST 21 04/04/2015 1038   AST 17 01/20/2015 1427   ALT 16 04/04/2015 1038   ALT 14 01/20/2015 1427   BILITOT 1.00 04/04/2015 1038   BILITOT 0.9 01/20/2015 1427         Impression and Plan: Mr. Rosalez is a 68 year old gentleman with metastatic prostate cancer. I believe that he now is castrate resistant. We are checking his testosterone level today. I would like to think that it will be quite low since we have a have been giving him monthly injections of degarelix.  Since his disease really is confined to his bones and that he has some symptoms, I think he would be a great candidate for Xofigo. .  The FDA has approved this for metastatic prostate cancer with bone only metastases. I think that the side effect profile would be very tolerable. I think he would be able to maintain a good quality of life.  I explained to them what Trudi Ida was and how it worked. I explained that this was a once a month injection. This is done by radiation oncology.  I spent about 45 minutes with he and his wife. I explained why I thought that the Casodex was not working. His PSA should not be going up.  We will continue him on degarelix. I think this will be important. He will also get his Xgeva.  I will see him back in one month. Hopefully, insurance will cover the Xofigo injections. He                 Volanda Napoleon, MD 12/9/20164:56 PM

## 2015-05-03 LAB — TESTOSTERONE: TESTOSTERONE: 38 ng/dL — AB (ref 300–890)

## 2015-05-03 LAB — PSA: PSA: 21.81 ng/mL — ABNORMAL HIGH (ref <=4.00)

## 2015-05-05 ENCOUNTER — Other Ambulatory Visit: Payer: Self-pay | Admitting: *Deleted

## 2015-05-05 DIAGNOSIS — C7951 Secondary malignant neoplasm of bone: Principal | ICD-10-CM

## 2015-05-05 DIAGNOSIS — C61 Malignant neoplasm of prostate: Secondary | ICD-10-CM

## 2015-05-08 ENCOUNTER — Telehealth: Payer: Self-pay | Admitting: *Deleted

## 2015-05-08 NOTE — Telephone Encounter (Addendum)
Patient aware of results  ----- Message from Volanda Napoleon, MD sent at 05/07/2015  5:33 PM EST ----- Call - testosterone is still low like we need it to be, but the PSA is still going up!!  I spoke with radiation therapy.and they will get you in for the [facility] therapy!!  Laurey Arrow

## 2015-05-09 ENCOUNTER — Ambulatory Visit
Admission: RE | Admit: 2015-05-09 | Discharge: 2015-05-09 | Disposition: A | Discharge status: Home / Self Care | Payer: Medicare Other | Source: Ambulatory Visit | Attending: Radiation Oncology | Admitting: Radiation Oncology

## 2015-05-09 ENCOUNTER — Telehealth: Payer: Self-pay | Admitting: *Deleted

## 2015-05-09 ENCOUNTER — Encounter: Payer: Self-pay | Admitting: Radiation Oncology

## 2015-05-09 ENCOUNTER — Other Ambulatory Visit: Payer: Self-pay | Admitting: Radiation Oncology

## 2015-05-09 VITALS — BP 135/70 | HR 64 | Temp 98.2°F | Resp 20 | Ht 69.0 in | Wt 174.4 lb

## 2015-05-09 DIAGNOSIS — C7951 Secondary malignant neoplasm of bone: Principal | ICD-10-CM

## 2015-05-09 DIAGNOSIS — C61 Malignant neoplasm of prostate: Secondary | ICD-10-CM | POA: Diagnosis present

## 2015-05-09 NOTE — Progress Notes (Signed)
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  Past/Anticipated interventions by urology, if any: prostatectomy 2012  Past/Anticipated interventions by medical oncology, if any: Degarelix 80 mg subcutaneous every month and Xgeva 120 mg subcutaneous every month.  He has also taken casodex.  Weight changes, if any: stable   Bowel/Bladder complaints, if any:  Regular bowels, no c/o bladder   Nausea/Vomiting, if any:  NO  Pain issues, if any: NO  SAFETY ISSUES: NO  Prior radiation? NO  Pacemaker/ICD? NO  Possible current pregnancy? no  Is the patient on methotrexate? no  Current Complaints / other details:   Married, CT scan from 04/23/15 shows "Sclerotic lesions are noted throughout the ribs, spine and pelvis."  Bone scan done as well.  Dr. Marin Olp is recommending Xofigo injections.   Takes 1 teaspoon baking soda with 1 teaspoon molasses with 8 oz water daily this [name] will increase to bid, Dr. Marin Olp aware BP 135/70 mmHg  Pulse 64  Temp(Src) 98.2 F (36.8 C) (Oral)  Resp 20  Ht 5\' 9"  (1.753 m)  Wt 174 lb 6.4 oz (79.107 kg)  BMI 25.74 kg/m2  Wt Readings from Last 3 Encounters:  05/09/15 174 lb 6.4 oz (79.107 kg)  05/02/15 167 lb (75.751 kg)  04/04/15 168 lb (76.204 kg)

## 2015-05-09 NOTE — Progress Notes (Signed)
Please see the Nurse Progress Note in the MD Initial Consult Encounter for this patient. 

## 2015-05-09 NOTE — Addendum Note (Signed)
Encounter addended by: Doreen Beam, RN on: 05/09/2015  1:36 PM<BR>     Documentation filed: Charges VN

## 2015-05-09 NOTE — Progress Notes (Signed)
  Radiation Oncology         [phone] ________________________________  Name: Alexander Ellis MRN: [id number]  Date: 05/09/2015  DOB: 1945/11/11  Xofigo Treatment Planning Note:  Diagnosis:  Castration resistant prostate cancer with painful bone involvement  Narrative: [name] is a patient who has been diagnosed with castration resistant prostate cancer with painful bone involvement.  His most recent blood counts show that he remains a good candidate to proceed with Ra-223.  The patient is going to receive Xofigo for his treatment.   Radiation Treatment Planning:  The prescribed radiation activity will be 50 kBq per kg per infusions. The plan is to offer a total of 6 IV administrations of this agent, assuming the blood counts are adequate prior to each administration, with each infusion done at 4 week intervals.  This will be done as an IV administration in the nuclear medicine department, with care to undertake all radiation protection precautions as recommended.    -----------------------------------  Blair Promise, PhD, MD

## 2015-05-09 NOTE — Progress Notes (Signed)
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 male who is seen out courtesy of Dr Marin Olp for consideration for radiation therapy as part of management of patient's castrate resistant metastatic prostate cancer. Patient has a prior history of a prostatectomy in 2012. Pathology from the patient's surgery at that time revealed a Gleason's 9 prostate cancer (4+5). This involved both sides of the prostate approximately 40%. Patient was also noted to have perineural and lymphovascular space invasion on his specimen,  Pathology, also revealed positive margins along the left and right distal urethral region This surgery was performed at [facility]. At a Later date the patient's PSA started rising. He was treated with hormone therapy at [facility] for a short period of time earlier this year and then transferred his care to [facility] with Dr. Marin Olp. Patient is been treated with Xgeva 120 mg subQ monthly and Degarelix 80 mg sub Q monthly . Unfortunately the patient's PSA continues to rise and evaluation recently shows the PSA was up to 21.81. Recent bone scan as well as CT scans of the chest abdomen and pelvis showed diffuse osseous metastasis without any significant soft tissue disease.  patient is now seen in radiation oncology for consideration for external beam or radioactive injection with radium 223 as part of his management.  PREVIOUS RADIATION THERAPY: No  PAST MEDICAL HISTORY:  has a past medical history of Cancer ([place]) (2014); GERD (gastroesophageal reflux disease); and Hepatitis.    PAST SURGICAL HISTORY: Past Surgical  History  Procedure Laterality Date  . Appendectomy    . Tonsillectomy    . Back surgery      [phone]  . Prostatectomy  2012    FAMILY HISTORY: family history includes Heart disease in his mother; Hypertension in his mother.  SOCIAL HISTORY:  reports that he has been smoking.  He does not have any smokeless tobacco history on file. He reports that he does not drink alcohol or use illicit drugs.  ALLERGIES: Penicillins; Other; Codeine; and Oxycodone  MEDICATIONS:  Current Outpatient Prescriptions  Medication Sig Dispense Refill  . Cholecalciferol (VITAMIN D-3) 1000 UNITS CAPS Take 1 capsule by mouth daily.    Marland Kitchen levofloxacin (LEVAQUIN) 750 MG tablet Take 750 mg by mouth daily.    . milk thistle 175 MG tablet Take 175 mg by mouth daily.    Marland Kitchen OVER THE COUNTER MEDICATION Take 1 capsule by mouth 2 (two) times daily.     . Sodium Bicarbonate (NICE PURE BAKING SODA) POWD by Does not apply route. 1 teaspoon baking soad and 1 teaspoon molasses daily in 8 oz water    . LORazepam (ATIVAN) 0.5 MG tablet Take 1 tablet (0.5 mg total) by mouth once. Take 30 min to an hour prior to CT scan. (Patient not taking: Reported on 05/09/2015) 1 tablet 0  . predniSONE (DELTASONE) 20 MG tablet Take 20 mg by mouth as directed.    . prochlorperazine (COMPAZINE) 10 MG tablet Take 1 tablet (10 mg total) by mouth once. Take 1 tablet 1 hr prior to CT scan and may repeat 1 tablet post scan if nauseated. 2 tablet 0  . vitamin  B-12 (CYANOCOBALAMIN) 100 MCG tablet Take 100 mcg by mouth daily.    . vitamin C (ASCORBIC ACID) 500 MG tablet Take 500 mg by mouth daily.     No current facility-administered medications for this encounter.    REVIEW OF SYSTEMS:  A 15 point review of systems is documented in the electronic medical record. This was obtained by the nursing staff. However, I reviewed this with the patient to discuss relevant findings and make appropriate changes. Some stiffness, discomfort in his hips and  pelvis upon awakening, no significant pain in the cervical or thoracic spine regions or rib cage   PHYSICAL EXAM:  height is 5\' 9"  (1.753 m) and weight is 174 lb 6.4 oz (79.107 kg). His oral temperature is 98.2 F (36.8 C). His blood pressure is 135/70 and his pulse is 64. His respiration is 20.    General: Alert and oriented, in no acute distress, accompanied by wife on evaluation today HEENT: Head is normocephalic. Extraocular movements are intact. Oropharynx is clear. Neck: Neck is supple, no palpable cervical or supraclavicular lymphadenopathy. Heart: Regular in rate and rhythm with no murmurs, rubs, or gallops. Chest: Clear to auscultation bilaterally, with no rhonchi, wheezes, or rales. Abdomen: Soft, nontender, nondistended, with no rigidity or guarding. Extremities: No cyanosis or edema. Lymphatics: see Neck Exam Skin: No concerning lesions. Musculoskeletal: symmetric strength and muscle tone throughout. Neurologic: Cranial nerves II through XII are grossly intact. No obvious focalities. Speech is fluent. Coordination is intact. Psychiatric: Judgment and insight are intact. Affect is appropriate.  ECOG = 1   LABORATORY DATA:  Lab Results  Component Value Date   WBC 10.4* 05/02/2015   HGB 15.2 05/02/2015   HCT 45.6 05/02/2015   MCV 95 05/02/2015   PLT 247 05/02/2015   NEUTROABS 6.7* 05/02/2015   Lab Results  Component Value Date   NA 140 05/02/2015   K 5.1 05/02/2015   CL 104 05/02/2015   CO2 24 05/02/2015   GLUCOSE 84 05/02/2015   CREATININE 1.17 05/02/2015   CALCIUM 9.5 05/02/2015   PSA 21.81    RADIOGRAPHY: Ct Chest W Contrast  04/23/2015  CLINICAL DATA:  Elevated PSA levels. Prostate cancer metastatic to bone. EXAM: CT CHEST, ABDOMEN, AND PELVIS WITH CONTRAST TECHNIQUE: Multidetector CT imaging of the chest, abdomen and pelvis was performed following the standard protocol during bolus administration of intravenous contrast. CONTRAST:  149mL OMNIPAQUE IOHEXOL  300 MG/ML  SOLN COMPARISON:  CT scan of pelvis of March 03, 2011. CT scan of abdomen of November 12, 2010. FINDINGS: CT CHEST FINDINGS Mediastinum/Nodes: Coronary artery calcifications are noted suggesting coronary artery disease. Ascending thoracic aorta is dilated at 3.9 cm. There is no evidence of thoracic aortic dissection. Mildly enlarged lymph nodes are noted throughout the mediastinum. Right hilar lymph node measuring 13 x 9 mm is noted. Subcarinal lymph node measuring 17 x 11 mm is noted precarinal lymph node measuring 23 x 10 mm is noted. Lungs/Pleura: Interstitial densities and peripheral honeycombing are noted in both lungs consistent with pulmonary fibrosis. Mild emphysematous disease is noted in the upper lobes bilaterally as well. No pneumothorax or pleural effusion is noted. 1.2 cm nodular density is noted posteriorly in the right lower lobe concerning for possible metastatic disease. Musculoskeletal: Sclerotic densities are noted throughout the thoracic spine and ribs consistent with metastatic disease. Old left posterior rib fractures are noted. CT ABDOMEN PELVIS FINDINGS Hepatobiliary: Mild cholelithiasis is noted. No biliary dilatation is noted. The liver appears normal. Pancreas: Normal. Spleen:  Normal. Adrenals/Urinary Tract: 1.7 cm right adrenal mass is noted. Bilateral simple renal cysts are noted. No hydronephrosis or renal obstruction is noted. Stomach/Bowel: There is no evidence of bowel obstruction. Vascular/Lymphatic: 4.3 cm infrarenal abdominal aortic aneurysm is noted. Atherosclerosis of abdominal aorta and iliac arteries is noted. Reproductive: Status post prostatectomy. Urinary bladder is unremarkable. Other: No significant adenopathy is noted. Musculoskeletal: Rounded sclerotic lesions are noted throughout the pelvis, sacrum and lumbar spine consistent with metastatic disease. IMPRESSION: Sclerotic lesions are noted throughout the ribs, spine and pelvis consistent with metastatic  disease. Coronary artery calcifications are noted suggesting coronary artery disease. 4.3 cm infrarenal abdominal aortic aneurysm is noted. Atherosclerosis of abdominal aorta and iliac arteries is noted. Findings consistent with pulmonary fibrosis and emphysematous disease throughout both lungs. 1.2 cm nodular density is noted posteriorly in the right lung base concerning for possible neoplasm or metastatic disease. Mildly enlarged mediastinal adenopathy is also noted concerning for possible metastatic disease. Electronically Signed   By: Marijo Conception, M.D.   On: 04/23/2015 10:21   Nm Bone Scan Whole Body  04/23/2015  CLINICAL DATA:  Prostate malignancy, rising PSA, no current skeletal symptoms, history of multiple back surgeries EXAM: NUCLEAR MEDICINE WHOLE BODY BONE SCAN TECHNIQUE: Whole body anterior and posterior images were obtained approximately 3 hours after intravenous injection of radiopharmaceutical. RADIOPHARMACEUTICALS:  26 mCi Technetium-55m MDP IV COMPARISON:  None in PACs FINDINGS: There is adequate uptake of the radiopharmaceutical by the skeleton. There is adequate soft tissue clearance and renal activity. Activity within the calvarium and cervical spine is normal. There is mildly increased uptake in the posterior elements of approximately C7 through T3. There is increased uptake in the posterior elements of T7 through T12. There is no significant lumbar spine uptake. There is long segment increased uptake in the posterior aspect of the right 4, 5, and 6th ribs. There is subtle focal increased uptake within the anterior aspects of the right fifth and left third and fourth ribs. There is asymmetrically increased uptake within the right SI joint with a small amount of uptake involving the upper aspect of the left SI joint. There is increased uptake associated with the right acetabulum and the intertrochanteric region of the right femur. Activity over the remainder the lower extremities is  within the limits of normal for age. Mildly increased uptake in the shoulders is within the limits of normal. IMPRESSION: 1. Increased uptake within the lower cervical spine and the thoracic spine corresponds to sclerotic lesions on today's chest and abdominal CT scan and is highly suspicious for metastatic disease. Also noted on today's CT scanner multiple sclerotic lesions within the lumbar spine which are not clearly abnormal on today's bone scan. 2. Abnormal uptake within the sacral ala bilaterally corresponding to sclerotic lesions on today's CT scan are consistent with metastatic disease. 3. Increased uptake in the intertrochanteric region of the right femur corresponds to sclerotic lesions here and is consistent with metastatic disease. 4. Increased uptake within multiple ribs bilaterally corresponds to sclerotic lesions and is consistent with metastatic disease. Electronically Signed   By: David  Martinique M.D.   On: 04/23/2015 12:13   Ct Abdomen Pelvis W Contrast  04/23/2015  CLINICAL DATA:  Elevated PSA levels. Prostate cancer metastatic to bone. EXAM: CT CHEST, ABDOMEN, AND PELVIS WITH CONTRAST TECHNIQUE: Multidetector CT imaging of the chest, abdomen and pelvis was performed following the standard protocol during bolus administration of intravenous contrast. CONTRAST:  183mL OMNIPAQUE IOHEXOL 300 MG/ML  SOLN COMPARISON:  CT scan of pelvis of March 03, 2011. CT scan of abdomen of November 12, 2010. FINDINGS: CT CHEST FINDINGS Mediastinum/Nodes: Coronary artery calcifications are noted suggesting coronary artery disease. Ascending thoracic aorta is dilated at 3.9 cm. There is no evidence of thoracic aortic dissection. Mildly enlarged lymph nodes are noted throughout the mediastinum. Right hilar lymph node measuring 13 x 9 mm is noted. Subcarinal lymph node measuring 17 x 11 mm is noted precarinal lymph node measuring 23 x 10 mm is noted. Lungs/Pleura: Interstitial densities and peripheral honeycombing are  noted in both lungs consistent with pulmonary fibrosis. Mild emphysematous disease is noted in the upper lobes bilaterally as well. No pneumothorax or pleural effusion is noted. 1.2 cm nodular density is noted posteriorly in the right lower lobe concerning for possible metastatic disease. Musculoskeletal: Sclerotic densities are noted throughout the thoracic spine and ribs consistent with metastatic disease. Old left posterior rib fractures are noted. CT ABDOMEN PELVIS FINDINGS Hepatobiliary: Mild cholelithiasis is noted. No biliary dilatation is noted. The liver appears normal. Pancreas: Normal. Spleen: Normal. Adrenals/Urinary Tract: 1.7 cm right adrenal mass is noted. Bilateral simple renal cysts are noted. No hydronephrosis or renal obstruction is noted. Stomach/Bowel: There is no evidence of bowel obstruction. Vascular/Lymphatic: 4.3 cm infrarenal abdominal aortic aneurysm is noted. Atherosclerosis of abdominal aorta and iliac arteries is noted. Reproductive: Status post prostatectomy. Urinary bladder is unremarkable. Other: No significant adenopathy is noted. Musculoskeletal: Rounded sclerotic lesions are noted throughout the pelvis, sacrum and lumbar spine consistent with metastatic disease. IMPRESSION: Sclerotic lesions are noted throughout the ribs, spine and pelvis consistent with metastatic disease. Coronary artery calcifications are noted suggesting coronary artery disease. 4.3 cm infrarenal abdominal aortic aneurysm is noted. Atherosclerosis of abdominal aorta and iliac arteries is noted. Findings consistent with pulmonary fibrosis and emphysematous disease throughout both lungs. 1.2 cm nodular density is noted posteriorly in the right lung base concerning for possible neoplasm or metastatic disease. Mildly enlarged mediastinal adenopathy is also noted concerning for possible metastatic disease. Electronically Signed   By: Marijo Conception, M.D.   On: 04/23/2015 10:21      IMPRESSION: Metastatic  castrate resistant prostate cancer-bone only metastasis. The patient would be an excellent candidate for radium 223. In reviewing his CT scans as well as his clinical symptoms he does not have any concerning areas that would warrant external beam radiation therapy at this time. I discussed treatment course side effects and potential toxicities of this therapy with the patient and his wife. The patient appears to understand wishes to proceed with a course of treatment.  PLAN: Patient will proceed with 6 monthly injections of radium 223 with a prescription dose of 1.35 Ci per kilogram. The patient will receive his first treatment next week. His recent blood work shows excellent CBC and therefore would be a good candidate for this injection.     ------------------------------------------------  Blair Promise, PhD, MD

## 2015-05-09 NOTE — Progress Notes (Signed)
  Radiation Oncology         [phone] ________________________________  Name: Alexander Ellis MRN: [id number]  Date: 05/09/2015  DOB: [birth year]   To whom it may concern:   Alexander Ellis is a patient with metastatic, castrate resistant prostate cancer with predominantly bone disease.  For details of his clinical history, please see the previously detailed consultation report that can be supplied with this letter.  I have recommended Radium 223 Dichloride treatment for monthly IV infusion over six months.  Based upon the randomized phase III Alsympca trial, we would expect an improvement in the patient's overall survival, improved pain control, decreased narcotic requirements, and decreased probability of skeletal related adverse events moving forward.  Given the data and this patient's situation, I feel that the delivery of Alexander Ellis is medical necessity.  Please call if any further information is required to aid in this patient's treatment.    Sincerely yours,  Blair Promise, PhD, MD  -----------------------------------------------------------------------------------------------------------  Ref:  Zack Seal, Diannia Ruder, et al., Alpha emitter radium-223 and survival in metastatic prostate cancer. [place] [employer] Med. 2013 Jul 18;369(3):213-23

## 2015-05-09 NOTE — Telephone Encounter (Signed)
CALLED PATIENT TO INFORM OF XOFIGO INJ. ON 05-15-15 - ARRIVAL TIME - 12 PM @ [facility], LVM FOR A RETURN CALL

## 2015-05-09 NOTE — Addendum Note (Signed)
Encounter addended by: Gery Pray, MD on: 05/09/2015  5:41 PM<BR>     Documentation filed: Follow-up Section, LOS Section, Clinical Notes, Notes Section, Visit Diagnoses, Problem List

## 2015-05-13 ENCOUNTER — Telehealth: Payer: Self-pay | Admitting: *Deleted

## 2015-05-13 NOTE — Telephone Encounter (Signed)
CALLED PATIENT TO REMIND OF [place] INJECTION FOR 05-15-15- @ [facility], SPOKE WITH PATIENT AND HE IS AWARE OF THIS APPT.

## 2015-05-14 ENCOUNTER — Telehealth: Payer: Self-pay | Admitting: *Deleted

## 2015-05-14 NOTE — Telephone Encounter (Signed)
Called patient to remind of inj. @ [facility] on 05-15-15, spoke with patient and he is aware of this injection

## 2015-05-14 NOTE — Telephone Encounter (Signed)
xxxx 

## 2015-05-15 ENCOUNTER — Ambulatory Visit (HOSPITAL_COMMUNITY)
Admission: RE | Admit: 2015-05-15 | Discharge: 2015-05-15 | Disposition: A | Discharge status: Home / Self Care | Payer: Medicare Other | Source: Ambulatory Visit | Attending: Radiation Oncology | Admitting: Radiation Oncology

## 2015-05-15 DIAGNOSIS — C7951 Secondary malignant neoplasm of bone: Secondary | ICD-10-CM | POA: Diagnosis not present

## 2015-05-15 DIAGNOSIS — C61 Malignant neoplasm of prostate: Secondary | ICD-10-CM | POA: Diagnosis not present

## 2015-05-15 NOTE — Progress Notes (Signed)
  Radiation Oncology         [phone] ________________________________  Name: Alexander Ellis MRN: [id number]  Date: 05/15/2015  DOB: [birth year]  Radium-223 Infusion Note  Diagnosis:  Castration resistant prostate cancer with painful bone involvement  Current Infusion:  1  Planned Infusions:  6  Narrative: Alexander Ellis presented to nuclear medicine for treatment. His most recent blood counts were reviewed.  He remains a good candidate to proceed with Ra-223.  The patient was situated in an infusion suite with a contact barrier placed under his arm. Intravenous access was established, using sterile technique, and a normal saline infusion from a syringe was started.  Micro-dosimetry:  The prescribed radiation activity was assayed and confirmed to be within specified tolerance.  Special Treatment Procedure - Infusion:  The nuclear medicine technologist and I personally verified the dose activity to be delivered as specified in the written directive, and verified the patient identification via 2 separate methods.  The syringe containing the dose was attached to a 3 way stopcock, and then the valve was opened to the patient, and the dose delivered over a minute. No complications were noted.  The total administered dose was 118.5 microcuries in a volume of 7.64 cc.  A saline flush of the line and the syringe that contained the isotope was then performed.  The residual radioactivity in the syringe was 2.6 microcuries, so the actual infused isotope activity was 115.9 microcuries.   Pressure was applied to the venipuncture site, and a compression bandage placed.   Radiation Safety personnel were present to perform the discharge survey, as detailed on their documentation.   After a short period of observation, the patient had his IV removed.  Impression:  The patient tolerated his infusion relatively well.  Plan:  The patient will return in one month for ongoing care.      -----------------------------------  Blair Promise, PhD, MD  This document serves as a record of services personally performed by Gery Pray, MD. It was created on his behalf by Darcus Austin, a trained medical scribe. The creation of this record is based on the scribe's personal observations and the provider's statements to them. This document has been checked and approved by the attending provider.

## 2015-05-21 ENCOUNTER — Telehealth: Payer: Self-pay | Admitting: *Deleted

## 2015-05-21 ENCOUNTER — Other Ambulatory Visit: Payer: Self-pay | Admitting: Radiation Oncology

## 2015-05-21 DIAGNOSIS — C7951 Secondary malignant neoplasm of bone: Principal | ICD-10-CM

## 2015-05-21 DIAGNOSIS — C61 Malignant neoplasm of prostate: Secondary | ICD-10-CM

## 2015-05-21 NOTE — Telephone Encounter (Signed)
CALLED PATIENT TO INFORM OF LAB FOR 06-12-15 @ 12:15 PM AND HIS XOFIGO INJ. ON 06-19-15 @ 12:30 PM @ [facility]

## 2015-05-28 ENCOUNTER — Ambulatory Visit: Payer: Medicare Other

## 2015-05-28 ENCOUNTER — Ambulatory Visit: Payer: Medicare Other | Admitting: Radiation Oncology

## 2015-06-06 ENCOUNTER — Encounter: Payer: Self-pay | Admitting: Hematology & Oncology

## 2015-06-06 ENCOUNTER — Other Ambulatory Visit (HOSPITAL_BASED_OUTPATIENT_CLINIC_OR_DEPARTMENT_OTHER): Payer: Medicare Other

## 2015-06-06 ENCOUNTER — Ambulatory Visit (HOSPITAL_BASED_OUTPATIENT_CLINIC_OR_DEPARTMENT_OTHER): Payer: Medicare Other

## 2015-06-06 ENCOUNTER — Ambulatory Visit (HOSPITAL_BASED_OUTPATIENT_CLINIC_OR_DEPARTMENT_OTHER): Payer: Medicare Other | Admitting: Hematology & Oncology

## 2015-06-06 VITALS — BP 119/75 | HR 86 | Temp 97.9°F | Resp 16 | Ht 69.0 in | Wt 169.0 lb

## 2015-06-06 DIAGNOSIS — C7951 Secondary malignant neoplasm of bone: Secondary | ICD-10-CM

## 2015-06-06 DIAGNOSIS — C61 Malignant neoplasm of prostate: Secondary | ICD-10-CM

## 2015-06-06 DIAGNOSIS — E895 Postprocedural testicular hypofunction: Secondary | ICD-10-CM

## 2015-06-06 DIAGNOSIS — Z5111 Encounter for antineoplastic chemotherapy: Secondary | ICD-10-CM

## 2015-06-06 LAB — CMP (CANCER CENTER ONLY)
ALK PHOS: 42 U/L (ref 26–84)
ALT: 13 U/L (ref 10–47)
AST: 23 U/L (ref 11–38)
Albumin: 4.1 g/dL (ref 3.3–5.5)
BILIRUBIN TOTAL: 1.2 mg/dL (ref 0.20–1.60)
BUN, Bld: 18 mg/dL (ref 7–22)
CALCIUM: 9.6 mg/dL (ref 8.0–10.3)
CO2: 26 meq/L (ref 18–33)
CREATININE: 1.1 mg/dL (ref 0.6–1.2)
Chloride: 106 mEq/L (ref 98–108)
Glucose, Bld: 92 mg/dL (ref 73–118)
Potassium: 5.4 mEq/L — ABNORMAL HIGH (ref 3.3–4.7)
Sodium: 147 mEq/L — ABNORMAL HIGH (ref 128–145)
TOTAL PROTEIN: 8 g/dL (ref 6.4–8.1)

## 2015-06-06 LAB — CBC WITH DIFFERENTIAL (CANCER CENTER ONLY)
BASO#: 0 10*3/uL (ref 0.0–0.2)
BASO%: 0.3 % (ref 0.0–2.0)
EOS%: 0.8 % (ref 0.0–7.0)
Eosinophils Absolute: 0.1 10*3/uL (ref 0.0–0.5)
HEMATOCRIT: 47.3 % (ref 38.7–49.9)
HEMOGLOBIN: 15.5 g/dL (ref 13.0–17.1)
LYMPH#: 2.1 10*3/uL (ref 0.9–3.3)
LYMPH%: 32 % (ref 14.0–48.0)
MCH: 32.1 pg (ref 28.0–33.4)
MCHC: 32.8 g/dL (ref 32.0–35.9)
MCV: 98 fL (ref 82–98)
MONO#: 0.7 10*3/uL (ref 0.1–0.9)
MONO%: 10.7 % (ref 0.0–13.0)
NEUT%: 56.2 % (ref 40.0–80.0)
NEUTROS ABS: 3.7 10*3/uL (ref 1.5–6.5)
Platelets: 211 10*3/uL (ref 145–400)
RBC: 4.83 10*6/uL (ref 4.20–5.70)
RDW: 14.3 % (ref 11.1–15.7)
WBC: 6.5 10*3/uL (ref 4.0–10.0)

## 2015-06-06 MED ORDER — DENOSUMAB 120 MG/1.7ML ~~LOC~~ SOLN
120.0000 mg | Freq: Once | SUBCUTANEOUS | Status: AC
  Administered 2015-06-06: 120 mg via SUBCUTANEOUS
  Filled 2015-06-06: qty 1.7

## 2015-06-06 MED ORDER — DEGARELIX ACETATE 80 MG ~~LOC~~ SOLR
80.0000 mg | Freq: Once | SUBCUTANEOUS | Status: AC
  Administered 2015-06-06: 80 mg via SUBCUTANEOUS
  Filled 2015-06-06: qty 4

## 2015-06-06 NOTE — Progress Notes (Signed)
Hematology and Oncology Follow Up Visit  Alexander Ellis [id number] [birth year] 70 y.o. 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 He has first Xofigo injection about 3 weeks ago. He tolerated this quite well. His metastatic prostate cancer bone pain has improved. He does not ache as much. He is able to do more in the way of daily activities. His quality of life is better.  We last saw him, his PSA was 22. A much or if it will come down yet.  He's had no problems with bowels or bladder.  He got through the snowstorm last weekend. He did fall. Thank you, he did not break anything.  There's not been any problems with cough or shortness of breath. He still smokes.  His last testosterone was 38. This is castrate level.    Overall, his performance status is ECOG 1  Medications:  Current outpatient prescriptions:  .  Cholecalciferol (VITAMIN D-3) 1000 UNITS CAPS, Take 1 capsule by mouth daily., Disp: , Rfl:  .  milk thistle 175 MG tablet, Take 175 mg by mouth daily., Disp: , Rfl:  .  OVER THE COUNTER MEDICATION, Take 1 capsule by mouth 2 (two) times daily. , Disp: , Rfl:  .  Sodium Bicarbonate (NICE PURE BAKING SODA) POWD, by Does not apply route. 1 teaspoon baking soad and 1 teaspoon molasses daily in 8 oz water, Disp: , Rfl:  .  vitamin B-12 (CYANOCOBALAMIN) 100 MCG tablet, Take 100 mcg by mouth daily., Disp: , Rfl:  .  vitamin C (ASCORBIC ACID) 500 MG tablet, Take 500 mg by mouth daily., Disp: , Rfl:  No current facility-administered medications for this visit.  Facility-Administered Medications Ordered in Other Visits:  .  degarelix (FIRMAGON) injection 80 mg, 80 mg, Subcutaneous, Once, Volanda Napoleon, MD .  denosumab (XGEVA)  injection 120 mg, 120 mg, Subcutaneous, Once, Volanda Napoleon, MD  Allergies:  Allergies  Allergen Reactions  . Penicillins Hives  . Other Nausea Only and Other (See Comments)    Uncoded Allergy. Allergen: IV contrast  . Codeine Nausea And Vomiting  . Oxycodone Other (See Comments)    somnolence    Past Medical History, Surgical history, Social history, and Family History were reviewed and updated.  Review of Systems: As above  Physical Exam:  height is 5\' 9"  (1.753 m) and weight is 169 lb (76.658 kg). His oral temperature is 97.9 F (36.6 C). His blood pressure is 119/75 and his pulse is 86. His respiration is 16.   Wt Readings from Last 3 Encounters:  06/06/15 169 lb (76.658 kg)  05/09/15 174 lb 6.4 oz (79.107 kg)  05/02/15 167 lb (75.751 kg)     Well-developed and well-nourished white gentleman in no obvious distress. Head and neck exam shows no ocular or oral lesion. There are no palpable cervical or supraclavicular lymph nodes. Lungs are clear bilaterally. Cardiac exam regular rate and rhythm with no murmurs, rubs or bruits. Abdomen is soft. He has good bowel sounds. There is no fluid wave. There is no palpable liver or spleen tip. Back exam shows no tenderness over the spine, ribs or hips. Extremities shows no clubbing, cyanosis or edema. Neurological exam shows no focal neurological deficits.  Lab Results  Component Value Date   WBC 6.5 06/06/2015   HGB 15.5 06/06/2015   HCT 47.3 06/06/2015   MCV 98 06/06/2015   PLT 211 06/06/2015     Chemistry      Component Value Date/Time   NA 147* 06/06/2015 1156   NA 140 05/02/2015 1415   K 5.4* 06/06/2015 1156   K 5.1 05/02/2015 1415   CL 106 06/06/2015 1156   CL 104 05/02/2015 1415   CO2 26 06/06/2015 1156   CO2 24 05/02/2015 1415   BUN 18 06/06/2015 1156   BUN 14 05/02/2015 1415   CREATININE 1.1 06/06/2015 1156   CREATININE 1.17 05/02/2015 1415      Component Value Date/Time   CALCIUM 9.6 06/06/2015 1156    CALCIUM 9.5 05/02/2015 1415   ALKPHOS 42 06/06/2015 1156   ALKPHOS 57 05/02/2015 1415   AST 23 06/06/2015 1156   AST 17 05/02/2015 1415   ALT 13 06/06/2015 1156   ALT 11 05/02/2015 1415   BILITOT 1.20 06/06/2015 1156   BILITOT 0.7 05/02/2015 1415         Impression and Plan: Alexander Ellis is a 70 year old gentleman with metastatic prostate cancer.   He will get his next Xofigo injection a week from Thursday.  We will continue him on the testosterone depletion injections.  He will get his Delton See today.  I will plan to get him back in another month.     Volanda Napoleon, MD 1/13/20171:21 PM

## 2015-06-06 NOTE — Patient Instructions (Signed)
Degarelix injection  What is this medicine?  DEGARELIX (deg a REL ix) is used to treat men with advanced prostate cancer.  This medicine may be used for other purposes; ask your health care provider or pharmacist if you have questions.  What should I tell my health care provider before I take this medicine?  They need to know if you have any of these conditions:  -heart disease  -kidney disease  -liver disease  -low levels of potassium or magnesium in the blood  -osteoporosis  -an unusual or allergic reaction to degarelix, mannitol, other medicines, foods, dyes, or preservatives  -pregnant or trying to get pregnant  -breast-feeding  How should I use this medicine?  This medicine is for injection under the skin. It is usually given by a health care professional in a hospital or clinic setting.  If you get this medicine at home, you will be taught how to prepare and give this medicine. Use exactly as directed. Take your medicine at regular intervals. Do not take it more often than directed.  It is important that you put your used needles and syringes in a special sharps container. Do not put them in a trash can. If you do not have a sharps container, call your pharmacist or healthcare provider to get one.  Talk to your pediatrician regarding the use of this medicine in children. Special care may be needed.  Overdosage: If you think you have taken too much of this medicine contact a poison control center or emergency room at once.  NOTE: This medicine is only for you. Do not share this medicine with others.  What if I miss a dose?  Try not to miss a dose. If you do miss a dose, call your doctor or health care professional for advice.  What may interact with this medicine?  Do not take this medicine with any of the following medications:  -amiodarone  -bretylium  -disopyramide  -dofetilide  -droperidol  -ibutilide  -procainamide  -quinidine  -sotalol  This list may not describe all possible interactions. Give your  health care provider a list of all the medicines, herbs, non-prescription drugs, or dietary supplements you use. Also tell them if you smoke, drink alcohol, or use illegal drugs. Some items may interact with your medicine.  What should I watch for while using this medicine?  Visit your doctor or health care professional for regular checks on your progress and discuss any issues before you start taking this medicine.  Do not rub or scratch injection site. There may be a lump at the injection site, or it may be red or sore for a few days after your dose.  Your doctor or health care professional will need to monitor your hormone levels in your blood to check your response to treatment. Try to keep any appointments for testing.  What side effects may I notice from receiving this medicine?  Side effects that you should report to your doctor or health care professional as soon as possible:  -allergic reactions like skin rash, itching or hives, swelling of the face, lips, or tongue  -fever or chills  -irregular heartbeat  -nausea and vomiting along with severe abdominal pain  -pain or difficulty passing urine  -pelvic pain or bloating  Side effects that usually do not require medical attention (Report these to your doctor or health care professional if they continue or are bothersome.):  -change in sex drive or performance  -constipation  -headache  -high blood pressure  -   to FDA at 1-800-FDA-1088. Where should I keep my medicine? Keep out of the reach of children. This drug is usually given in a hospital or clinic and will not be stored at home. In rare cases, this medicine may be given at home. If you are  using this medicine at home, you will be instructed on how to store this medicine. Throw away any unused medicine after the expiration date on the label. NOTE: This sheet is a summary. It may not cover all possible information. If you have questions about this medicine, talk to your doctor, pharmacist, or health care provider.    2016, Elsevier/Gold Standard. (2014-05-15 17:38:48) Denosumab injection What is this medicine? DENOSUMAB (den oh sue mab) slows bone breakdown. Prolia is used to treat osteoporosis in women after menopause and in men. Delton See is used to prevent bone fractures and other bone problems caused by cancer bone metastases. Delton See is also used to treat giant cell tumor of the bone. This medicine may be used for other purposes; ask your health care provider or pharmacist if you have questions. What should I tell my health care provider before I take this medicine? They need to know if you have any of these conditions: -dental disease -eczema -infection or history of infections -kidney disease or on dialysis -low blood calcium or vitamin D -malabsorption syndrome -scheduled to have surgery or tooth extraction -taking medicine that contains denosumab -thyroid or parathyroid disease -an unusual reaction to denosumab, other medicines, foods, dyes, or preservatives -pregnant or trying to get pregnant -breast-feeding How should I use this medicine? This medicine is for injection under the skin. It is given by a health care professional in a hospital or clinic setting. If you are getting Prolia, a special MedGuide will be given to you by the pharmacist with each prescription and refill. Be sure to read this information carefully each time. For Prolia, talk to your pediatrician regarding the use of this medicine in children. Special care may be needed. For Delton See, talk to your pediatrician regarding the use of this medicine in children. While this drug may be prescribed for children as  young as 13 years for selected conditions, precautions do apply. Overdosage: If you think you have taken too much of this medicine contact a poison control center or emergency room at once. NOTE: This medicine is only for you. Do not share this medicine with others. What if I miss a dose? It is important not to miss your dose. Call your doctor or health care professional if you are unable to keep an appointment. What may interact with this medicine? Do not take this medicine with any of the following medications: -other medicines containing denosumab This medicine may also interact with the following medications: -medicines that suppress the immune system -medicines that treat cancer -steroid medicines like prednisone or cortisone This list may not describe all possible interactions. Give your health care provider a list of all the medicines, herbs, non-prescription drugs, or dietary supplements you use. Also tell them if you smoke, drink alcohol, or use illegal drugs. Some items may interact with your medicine. What should I watch for while using this medicine? Visit your doctor or health care professional for regular checks on your progress. Your doctor or health care professional may order blood tests and other tests to see how you are doing. Call your doctor or health care professional if you get a cold or other infection while receiving this medicine. Do not treat yourself. This medicine may  decrease your body's ability to fight infection. You should make sure you get enough calcium and vitamin D while you are taking this medicine, unless your doctor tells you not to. Discuss the foods you eat and the vitamins you take with your health care professional. See your dentist regularly. Brush and floss your teeth as directed. Before you have any dental work done, tell your dentist you are receiving this medicine. Do not become pregnant while taking this medicine or for 5 months after stopping it.  Women should inform their doctor if they wish to become pregnant or think they might be pregnant. There is a potential for serious side effects to an unborn child. Talk to your health care professional or pharmacist for more information. What side effects may I notice from receiving this medicine? Side effects that you should report to your doctor or health care professional as soon as possible: -allergic reactions like skin rash, itching or hives, swelling of the face, lips, or tongue -breathing problems -chest pain -fast, irregular heartbeat -feeling faint or lightheaded, falls -fever, chills, or any other sign of infection -muscle spasms, tightening, or twitches -numbness or tingling -skin blisters or bumps, or is dry, peels, or red -slow healing or unexplained pain in the mouth or jaw -unusual bleeding or bruising Side effects that usually do not require medical attention (Report these to your doctor or health care professional if they continue or are bothersome.): -muscle pain -stomach upset, gas This list may not describe all possible side effects. Call your doctor for medical advice about side effects. You may report side effects to FDA at 1-800-FDA-1088. Where should I keep my medicine? This medicine is only given in a clinic, doctor's office, or other health care setting and will not be stored at home. NOTE: This sheet is a summary. It may not cover all possible information. If you have questions about this medicine, talk to your doctor, pharmacist, or health care provider.    2016, Elsevier/Gold Standard. (2011-11-08 12:37:47)

## 2015-06-07 LAB — PSA: Prostate Specific Ag, Serum: 48.8 ng/mL — ABNORMAL HIGH (ref 0.0–4.0)

## 2015-06-07 LAB — PSA (PARALLEL TESTING): PSA: 48.03 ng/mL — AB (ref <=4.00)

## 2015-06-11 ENCOUNTER — Telehealth: Payer: Self-pay | Admitting: *Deleted

## 2015-06-11 NOTE — Telephone Encounter (Signed)
CALLED PATIENT TO REMIND OF LAB AND WEIGHT FOR 06-12-15 FOR HIS INJECTION ON 06-19-15, SPOKE WITH THIS PATIENT AND HE IS AWARE OF THIS APPT.

## 2015-06-12 ENCOUNTER — Other Ambulatory Visit: Payer: Self-pay | Admitting: Oncology

## 2015-06-12 ENCOUNTER — Ambulatory Visit
Admission: RE | Admit: 2015-06-12 | Discharge: 2015-06-12 | Disposition: A | Discharge status: Home / Self Care | Payer: Medicare Other | Source: Ambulatory Visit | Attending: Radiation Oncology | Admitting: Radiation Oncology

## 2015-06-12 ENCOUNTER — Encounter: Payer: Self-pay | Admitting: *Deleted

## 2015-06-12 DIAGNOSIS — C61 Malignant neoplasm of prostate: Secondary | ICD-10-CM

## 2015-06-12 DIAGNOSIS — C7951 Secondary malignant neoplasm of bone: Secondary | ICD-10-CM | POA: Insufficient documentation

## 2015-06-12 LAB — CBC WITH DIFFERENTIAL/PLATELET
BASO%: 1 % (ref 0.0–2.0)
BASOS ABS: 0.1 10*3/uL (ref 0.0–0.1)
EOS ABS: 0 10*3/uL (ref 0.0–0.5)
EOS%: 0.3 % (ref 0.0–7.0)
HEMATOCRIT: 46.4 % (ref 38.4–49.9)
HGB: 15.5 g/dL (ref 13.0–17.1)
LYMPH%: 21.7 % (ref 14.0–49.0)
MCH: 31.8 pg (ref 27.2–33.4)
MCHC: 33.3 g/dL (ref 32.0–36.0)
MCV: 95.4 fL (ref 79.3–98.0)
MONO#: 0.8 10*3/uL (ref 0.1–0.9)
MONO%: 9.5 % (ref 0.0–14.0)
NEUT#: 5.9 10*3/uL (ref 1.5–6.5)
NEUT%: 67.5 % (ref 39.0–75.0)
PLATELETS: 233 10*3/uL (ref 140–400)
RBC: 4.87 10*6/uL (ref 4.20–5.82)
RDW: 14.2 % (ref 11.0–14.6)
WBC: 8.8 10*3/uL (ref 4.0–10.3)
lymph#: 1.9 10*3/uL (ref 0.9–3.3)

## 2015-06-12 NOTE — Progress Notes (Signed)
Patient reports that after his injections on Friday his abdomen became very redened and sore. He provided a picture that shows a band along the bottom of his abdomen which is red and appears slightly swollen. It is clearing up today. He also reports that his joints were extremely sore for 3 to 4 days after the injection. He reports these symptoms were isolated to this past Friday; he has never had these symptoms after his previous treatments. Dr Marin Olp notified. He states if symptoms are improving he doesn't want to order anything additional.

## 2015-06-17 ENCOUNTER — Telehealth: Payer: Self-pay | Admitting: *Deleted

## 2015-06-17 NOTE — Telephone Encounter (Signed)
On 06-17-15 fax medical records to Dr. Dorthula Matas at [facility] it was consult note, Xofigo tx planning note, Radium 223 infusion note.

## 2015-06-18 ENCOUNTER — Telehealth: Payer: Self-pay | Admitting: *Deleted

## 2015-06-18 NOTE — Telephone Encounter (Signed)
CALLED PATIENT TO REMIND OF XOFIGO INJ. FOR 06-19-15, SPOKE WITH PATIENT AND HE IS AWARE OF THIS INJ.

## 2015-06-19 ENCOUNTER — Ambulatory Visit (HOSPITAL_COMMUNITY)
Admission: RE | Admit: 2015-06-19 | Discharge: 2015-06-19 | Disposition: A | Discharge status: Home / Self Care | Payer: Medicare Other | Source: Ambulatory Visit | Attending: Radiation Oncology | Admitting: Radiation Oncology

## 2015-06-19 DIAGNOSIS — C7951 Secondary malignant neoplasm of bone: Secondary | ICD-10-CM | POA: Diagnosis not present

## 2015-06-19 DIAGNOSIS — C61 Malignant neoplasm of prostate: Secondary | ICD-10-CM

## 2015-06-19 NOTE — Progress Notes (Signed)
  Radiation Oncology [phone] ________________________________  Name: Alexander Ellis WestMRN: [id number] Date: 1/26/17DOB: 01-31-1946  Radium-223 Infusion Note  Diagnosis: Castration resistant prostate cancer with painful bone involvement  Current Infusion: 2  Planned Infusions: 6  Narrative: Alexander Ellis presented to nuclear medicine for treatment. His most recent blood counts were reviewed. He remains a good candidate to proceed with Ra-223. The patient was situated in an infusion suite with a contact barrier placed under his arm. Intravenous access was established, using sterile technique, and a normal saline infusion from a syringe was started.  Micro-dosimetry: The prescribed radiation activity was assayed and confirmed to be within specified tolerance.  Special Treatment Procedure - Infusion: The nuclear medicine technologist and I personally verified the dose activity to be delivered as specified in the written directive, and verified the patient identification via 2 separate methods. The syringe containing the dose was attached to a 3 way stopcock, and then the valve was opened to the patient, and the dose delivered over a minute. No complications were noted. The total administered dose was 116.0 microcuries.. A saline flush of the line and the syringe that contained the isotope was then performed. The residual radioactivity in the syringe was 4 microcuries, so the actual infused isotope activity was 112 microcuries. Pressure was applied to the venipuncture site, and a compression bandage placed. Radiation Safety personnel were present to perform the discharge survey, as detailed on their documentation. After a short period of observation, the patient had his IV removed.  Impression: The patient tolerated his infusion relatively well.  Plan: The patient will return in one month for ongoing care.     ----------------------------------- Thea Silversmith, MD

## 2015-06-25 ENCOUNTER — Telehealth: Payer: Self-pay | Admitting: *Deleted

## 2015-06-25 ENCOUNTER — Other Ambulatory Visit: Payer: Self-pay | Admitting: Radiation Oncology

## 2015-06-25 DIAGNOSIS — C61 Malignant neoplasm of prostate: Secondary | ICD-10-CM

## 2015-06-25 DIAGNOSIS — C7951 Secondary malignant neoplasm of bone: Principal | ICD-10-CM

## 2015-06-25 NOTE — Telephone Encounter (Signed)
CALLED PATIENT TO INFORM OF LAB AND WEIGHT ON 07-10-15 @ 12:15 PM @ [place]. FOR 07-17-15 - ARRIVAL TIME - 12:15 PM @ [facility], SPOKE WITH PATIENT AND HE IS AWARE OF THESE APPTS.

## 2015-07-10 ENCOUNTER — Ambulatory Visit: Payer: Medicare Other | Admitting: Hematology & Oncology

## 2015-07-10 ENCOUNTER — Ambulatory Visit: Payer: Medicare Other

## 2015-07-10 ENCOUNTER — Ambulatory Visit: Admission: RE | Admit: 2015-07-10 | Payer: Medicare Other | Source: Ambulatory Visit

## 2015-07-10 ENCOUNTER — Other Ambulatory Visit: Payer: Medicare Other

## 2015-07-16 ENCOUNTER — Telehealth: Payer: Self-pay | Admitting: *Deleted

## 2015-07-16 NOTE — Telephone Encounter (Signed)
CALLED PATIENT TO REMIND OF X. INJ. FOR 07-17-15- ARRIVAL TIME - 12 PM @ [facility], LVM FOR A RETURN CALL

## 2015-07-17 ENCOUNTER — Encounter (HOSPITAL_COMMUNITY)
Admission: RE | Admit: 2015-07-17 | Discharge: 2015-07-17 | Disposition: A | Discharge status: Home / Self Care | Payer: Medicare Other | Source: Ambulatory Visit | Attending: Radiation Oncology | Admitting: Radiation Oncology

## 2015-07-17 DIAGNOSIS — C61 Malignant neoplasm of prostate: Secondary | ICD-10-CM | POA: Diagnosis not present

## 2015-07-17 DIAGNOSIS — C7951 Secondary malignant neoplasm of bone: Secondary | ICD-10-CM | POA: Insufficient documentation

## 2015-07-17 NOTE — Progress Notes (Signed)
  Radiation Oncology         [phone] ________________________________  Name: Alexander Ellis MRN: [id number]  Date: 07/17/2015  DOB: 1945/07/18  Radium-223 Infusion Note  Diagnosis:  Castration resistant prostate cancer with painful bone involvement  Current Infusion:    2  Planned Infusions:  6  Narrative: Mr. Alexander Ellis presented to nuclear medicine for treatment. His most recent blood counts were reviewed.  He remains a good candidate to proceed with Ra-223.  The patient was situated in an infusion suite with a contact barrier placed under his arm. Intravenous access was established, using sterile technique, and a normal saline infusion from a syringe was started.  Micro-dosimetry:  The prescribed radiation activity was assayed and confirmed to be within specified tolerance.  Special Treatment Procedure - Infusion:  The nuclear medicine technologist and I personally verified the dose activity to be delivered as specified in the written directive, and verified the patient identification via 2 separate methods.  The syringe containing the dose was attached to a 3 way stopcock, and then the valve was opened to the patient, and the dose delivered over a minute. No complications were noted.  The total administered dose was 113.0 microcuries in a volume of 6.85 cc.  A saline flush of the line and the syringe that contained the isotope was then performed.  The residual radioactivity in the syringe was 2.85 microcuries, so the actual infused isotope activity was 110.15 microcuries.   Pressure was applied to the venipuncture site, and a compression bandage placed.   Radiation Safety personnel were present to perform the discharge survey, as detailed on their documentation.   After a short period of observation, the patient had his IV removed.  Impression:  The patient tolerated his infusion relatively well.  Plan:  The patient will return in one month for ongoing care.      -----------------------------------  Blair Promise, PhD, MD  This document serves as a record of services personally performed by Gery Pray, MD. It was created on his behalf by Darcus Austin, a trained medical scribe. The creation of this record is based on the scribe's personal observations and the provider's statements to them. This document has been checked and approved by the attending provider.

## 2015-07-21 ENCOUNTER — Telehealth: Payer: Self-pay | Admitting: *Deleted

## 2015-07-21 ENCOUNTER — Other Ambulatory Visit: Payer: Self-pay | Admitting: Radiation Oncology

## 2015-07-21 DIAGNOSIS — C61 Malignant neoplasm of prostate: Secondary | ICD-10-CM

## 2015-07-21 DIAGNOSIS — C7951 Secondary malignant neoplasm of bone: Principal | ICD-10-CM

## 2015-07-21 NOTE — Telephone Encounter (Signed)
CALLED PATIENT TO INFORM OF LAB AND WEIGHT ON 08-07-15 @ 12 PM AND HIS [place]. ON 08-14-15 @ 12 PM @ [facility], SPOKE WITH PATIENT AND HE IS AWARE OF THESE APPTS.

## 2015-08-06 ENCOUNTER — Telehealth: Payer: Self-pay | Admitting: *Deleted

## 2015-08-06 NOTE — Telephone Encounter (Signed)
CALLED PATIENT TO REMIND OF LAB AND WEIGHT FOR XOFIGO FOR 08-07-15 @ 12 PM, SPOKE WITH PATIENT AND HE IS AWARE OF THIS APPT.

## 2015-08-07 ENCOUNTER — Other Ambulatory Visit: Payer: Self-pay | Admitting: Oncology

## 2015-08-07 ENCOUNTER — Ambulatory Visit
Admission: RE | Admit: 2015-08-07 | Discharge: 2015-08-07 | Disposition: A | Discharge status: Home / Self Care | Payer: Medicare Other | Source: Ambulatory Visit | Attending: Radiation Oncology | Admitting: Radiation Oncology

## 2015-08-07 DIAGNOSIS — C7951 Secondary malignant neoplasm of bone: Secondary | ICD-10-CM | POA: Insufficient documentation

## 2015-08-07 DIAGNOSIS — C61 Malignant neoplasm of prostate: Secondary | ICD-10-CM | POA: Insufficient documentation

## 2015-08-07 LAB — CBC WITH DIFFERENTIAL/PLATELET
BASO%: 0.7 % (ref 0.0–2.0)
Basophils Absolute: 0.1 10*3/uL (ref 0.0–0.1)
EOS%: 2 % (ref 0.0–7.0)
Eosinophils Absolute: 0.1 10*3/uL (ref 0.0–0.5)
HCT: 43 % (ref 38.4–49.9)
HGB: 14.3 g/dL (ref 13.0–17.1)
LYMPH#: 1.5 10*3/uL (ref 0.9–3.3)
LYMPH%: 19.7 % (ref 14.0–49.0)
MCH: 32.2 pg (ref 27.2–33.4)
MCHC: 33.2 g/dL (ref 32.0–36.0)
MCV: 96.8 fL (ref 79.3–98.0)
MONO#: 0.6 10*3/uL (ref 0.1–0.9)
MONO%: 8.3 % (ref 0.0–14.0)
NEUT%: 69.3 % (ref 39.0–75.0)
NEUTROS ABS: 5.1 10*3/uL (ref 1.5–6.5)
Platelets: 231 10*3/uL (ref 140–400)
RBC: 4.44 10*6/uL (ref 4.20–5.82)
RDW: 14.6 % (ref 11.0–14.6)
WBC: 7.4 10*3/uL (ref 4.0–10.3)

## 2015-08-13 ENCOUNTER — Telehealth: Payer: Self-pay | Admitting: *Deleted

## 2015-08-13 NOTE — Telephone Encounter (Signed)
Called patient to remind of Xofigo Inj. For 08-14-15- arrival time - 11:45 am @ [facility], spoke with patient and he is aware of this inj.

## 2015-08-14 ENCOUNTER — Ambulatory Visit (HOSPITAL_COMMUNITY)
Admission: RE | Admit: 2015-08-14 | Discharge: 2015-08-14 | Disposition: A | Discharge status: Home / Self Care | Payer: Medicare Other | Source: Ambulatory Visit | Attending: Radiation Oncology | Admitting: Radiation Oncology

## 2015-08-14 DIAGNOSIS — C7951 Secondary malignant neoplasm of bone: Secondary | ICD-10-CM | POA: Insufficient documentation

## 2015-08-14 DIAGNOSIS — C61 Malignant neoplasm of prostate: Secondary | ICD-10-CM

## 2015-08-14 NOTE — Progress Notes (Signed)
  Radiation Oncology         [phone] ________________________________  Name: Alexander Ellis MRN: [id number]  Date: 08/14/2015  DOB: [birth year]  Radium-223 Infusion Note  Diagnosis:  Castration resistant prostate cancer with painful bone involvement  Current Infusion:    4  Planned Infusions:  6  Narrative: [name] presented to nuclear medicine for treatment. His most recent blood counts were reviewed. He has had some mild nausea with this previous infusions. He denies any pain this time .He remains a good candidate to proceed with Ra-223.  The patient was situated in an infusion suite with a contact barrier placed under his arm. Intravenous access was established, using sterile technique, and a normal saline infusion from a syringe was started.  Micro-dosimetry:  The prescribed radiation activity was assayed and confirmed to be within specified tolerance.  Special Treatment Procedure - Infusion:  The nuclear medicine technologist and I personally verified the dose activity to be delivered as specified in the written directive, and verified the patient identification via 2 separate methods.  The syringe containing the dose was attached to a 3 way stopcock, and then the valve was opened to the patient, and the dose delivered over a minute. No complications were noted.  The total administered dose was 114.8 microcuries in a volume of 6.92cc.  A saline flush of the line and the syringe that contained the isotope was then performed.  The residual radioactivity in the syringe was 2.65 microcuries, so the actual infused isotope activity was 112.76microcuries.   Pressure was applied to the venipuncture site, and a compression bandage placed.   Radiation Safety personnel were present to perform the discharge survey, as detailed on their documentation.   After a short period of observation, the patient had his IV removed.  Impression:  The patient tolerated his infusion relatively well.  Plan:   The patient will return in one month for ongoing care.    -----------------------------------  Blair Promise, PhD, MD  This document serves as a record of services personally performed by Gery Pray, MD. It was created on his behalf by Derek Mound, a trained medical scribe. The creation of this record is based on the scribe's personal observations and the provider's statements to them. This document has been checked and approved by the attending provider.

## 2015-08-15 ENCOUNTER — Other Ambulatory Visit: Payer: Self-pay | Admitting: Radiation Oncology

## 2015-08-15 ENCOUNTER — Telehealth: Payer: Self-pay | Admitting: *Deleted

## 2015-08-15 DIAGNOSIS — C7951 Secondary malignant neoplasm of bone: Principal | ICD-10-CM

## 2015-08-15 DIAGNOSIS — C61 Malignant neoplasm of prostate: Secondary | ICD-10-CM

## 2015-08-15 NOTE — Addendum Note (Signed)
Encounter addended by: Arnell Asal on: 08/15/2015  9:18 AM<BR>     Documentation filed: Charges VN

## 2015-08-15 NOTE — Telephone Encounter (Signed)
CALLED PATIENT TO INFORM OF LAB AND WEIGHT FOR XOFIGO - LAB AND WEIGHT (09/11/15 @ 12 PM) @ [place]. ON 09-18-15 2 12  PM @ [facility], SPOKE WITH PATIENT AND HE IS AWARE OF THESE APPTS.

## 2015-09-03 NOTE — Addendum Note (Signed)
Encounter addended by: Arnell Asal on: 09/03/2015  8:19 AM<BR>     Documentation filed: Charges VN

## 2015-09-10 ENCOUNTER — Telehealth: Payer: Self-pay | Admitting: *Deleted

## 2015-09-10 NOTE — Telephone Encounter (Signed)
CALLED PATIENT TO REMIND OF LABS AND WEIGHT FOR 09-11-15 @ NOON FOR HIS INJ. ON 09-18-15

## 2015-09-11 ENCOUNTER — Ambulatory Visit
Admission: RE | Admit: 2015-09-11 | Discharge: 2015-09-11 | Disposition: A | Discharge status: Home / Self Care | Payer: Medicare Other | Source: Ambulatory Visit | Attending: Radiation Oncology | Admitting: Radiation Oncology

## 2015-09-11 ENCOUNTER — Other Ambulatory Visit: Payer: Self-pay | Admitting: Oncology

## 2015-09-11 DIAGNOSIS — C61 Malignant neoplasm of prostate: Secondary | ICD-10-CM | POA: Insufficient documentation

## 2015-09-11 DIAGNOSIS — C7951 Secondary malignant neoplasm of bone: Secondary | ICD-10-CM | POA: Insufficient documentation

## 2015-09-11 LAB — CBC WITH DIFFERENTIAL/PLATELET
BASO%: 0.5 % (ref 0.0–2.0)
BASOS ABS: 0 10*3/uL (ref 0.0–0.1)
EOS ABS: 0.2 10*3/uL (ref 0.0–0.5)
EOS%: 2.6 % (ref 0.0–7.0)
HEMATOCRIT: 39.6 % (ref 38.4–49.9)
HEMOGLOBIN: 13.4 g/dL (ref 13.0–17.1)
LYMPH#: 1.2 10*3/uL (ref 0.9–3.3)
LYMPH%: 19.7 % (ref 14.0–49.0)
MCH: 32.8 pg (ref 27.2–33.4)
MCHC: 33.8 g/dL (ref 32.0–36.0)
MCV: 97.1 fL (ref 79.3–98.0)
MONO#: 0.5 10*3/uL (ref 0.1–0.9)
MONO%: 8.3 % (ref 0.0–14.0)
NEUT#: 4.3 10*3/uL (ref 1.5–6.5)
NEUT%: 68.9 % (ref 39.0–75.0)
PLATELETS: 232 10*3/uL (ref 140–400)
RBC: 4.08 10*6/uL — ABNORMAL LOW (ref 4.20–5.82)
RDW: 15 % — ABNORMAL HIGH (ref 11.0–14.6)
WBC: 6.2 10*3/uL (ref 4.0–10.3)

## 2015-09-17 ENCOUNTER — Telehealth: Payer: Self-pay | Admitting: *Deleted

## 2015-09-17 NOTE — Telephone Encounter (Signed)
CALLED PATIENT TO REMIND OF XOFIGO INJ. ON 09-18-15 @ 12 PM, SPOKE WITH PATIENT AND HE IS AWARE OF THIS INJ.

## 2015-09-18 ENCOUNTER — Encounter (HOSPITAL_COMMUNITY)
Admission: RE | Admit: 2015-09-18 | Discharge: 2015-09-18 | Disposition: A | Discharge status: Home / Self Care | Payer: Medicare Other | Source: Ambulatory Visit | Attending: Radiation Oncology | Admitting: Radiation Oncology

## 2015-09-18 ENCOUNTER — Other Ambulatory Visit: Payer: Self-pay | Admitting: Radiation Oncology

## 2015-09-18 ENCOUNTER — Telehealth: Payer: Self-pay | Admitting: *Deleted

## 2015-09-18 DIAGNOSIS — C7951 Secondary malignant neoplasm of bone: Secondary | ICD-10-CM | POA: Diagnosis present

## 2015-09-18 DIAGNOSIS — C61 Malignant neoplasm of prostate: Secondary | ICD-10-CM | POA: Diagnosis present

## 2015-09-18 NOTE — Telephone Encounter (Signed)
CALLED PATIENT TO INFORM OF LABS AND XOFIGO INJ., LVM  FOR A RETURN CALL

## 2015-09-18 NOTE — Progress Notes (Signed)
  Radiation Oncology         [phone] ________________________________  Name: Alexander Ellis MRN: [id number]  Date: 09/18/2015  DOB: 1946-05-11  Radium-223 Infusion Note  Diagnosis:  Castration resistant prostate cancer with painful bone involvement  Current Infusion:    5  Planned Infusions:  6  Narrative: [name] presented to nuclear medicine for treatment. His most recent blood counts were reviewed. He has had some mild nausea with this previous infusions. He denies any pain this time .He remains a good candidate to proceed with Ra-223.  The patient was situated in an infusion suite with a contact barrier placed under his arm. Intravenous access was established, using sterile technique, and a normal saline infusion from a syringe was started.  Micro-dosimetry:  The prescribed radiation activity was assayed and confirmed to be within specified tolerance.  Special Treatment Procedure - Infusion:  The nuclear medicine technologist and I personally verified the dose activity to be delivered as specified in the written directive, and verified the patient identification via 2 separate methods.  The syringe containing the dose was attached to a 3 way stopcock, and then the valve was opened to the patient, and the dose delivered over a minute. No complications were noted.  The total administered dose was 111.7 microcuries in a volume of 6.68 cc.  A saline flush of the line and the syringe that contained the isotope was then performed.  The residual radioactivity in the syringe was 3.1 microcuries, so the actual infused isotope activity was 108.6 microcuries.   Pressure was applied to the venipuncture site, and a compression bandage placed.   Radiation Safety personnel were present to perform the discharge survey, as detailed on their documentation.   After a short period of observation, the patient had his IV removed.  Impression:  The patient tolerated his infusion relatively well.  Plan:   The patient will return in one month for ongoing care.    -----------------------------------  Blair Promise, PhD, MD  This document serves as a record of services personally performed by Gery Pray, MD. It was created on his behalf by Darcus Austin, a trained medical scribe. The creation of this record is based on the scribe's personal observations and the provider's statements to them. This document has been checked and approved by the attending provider.

## 2015-10-08 ENCOUNTER — Telehealth: Payer: Self-pay | Admitting: *Deleted

## 2015-10-08 NOTE — Telephone Encounter (Signed)
Called patient to remind of labs and weight for 10-09-15 @ 12, spoke with patient and he is aware of this appt.

## 2015-10-09 ENCOUNTER — Ambulatory Visit
Admission: RE | Admit: 2015-10-09 | Discharge: 2015-10-09 | Disposition: A | Discharge status: Home / Self Care | Payer: Medicare Other | Source: Ambulatory Visit | Attending: Radiation Oncology | Admitting: Radiation Oncology

## 2015-10-09 ENCOUNTER — Other Ambulatory Visit: Payer: Self-pay | Admitting: Oncology

## 2015-10-09 DIAGNOSIS — C61 Malignant neoplasm of prostate: Secondary | ICD-10-CM

## 2015-10-09 DIAGNOSIS — C7951 Secondary malignant neoplasm of bone: Secondary | ICD-10-CM | POA: Insufficient documentation

## 2015-10-09 LAB — CBC WITH DIFFERENTIAL/PLATELET
BASO%: 0.6 % (ref 0.0–2.0)
BASOS ABS: 0 10*3/uL (ref 0.0–0.1)
EOS%: 1.9 % (ref 0.0–7.0)
Eosinophils Absolute: 0.1 10*3/uL (ref 0.0–0.5)
HEMATOCRIT: 37.8 % — AB (ref 38.4–49.9)
HEMOGLOBIN: 12.7 g/dL — AB (ref 13.0–17.1)
LYMPH#: 0.9 10*3/uL (ref 0.9–3.3)
LYMPH%: 14.2 % (ref 14.0–49.0)
MCH: 32.7 pg (ref 27.2–33.4)
MCHC: 33.6 g/dL (ref 32.0–36.0)
MCV: 97.5 fL (ref 79.3–98.0)
MONO#: 0.7 10*3/uL (ref 0.1–0.9)
MONO%: 10.9 % (ref 0.0–14.0)
NEUT#: 4.7 10*3/uL (ref 1.5–6.5)
NEUT%: 72.4 % (ref 39.0–75.0)
PLATELETS: 243 10*3/uL (ref 140–400)
RBC: 3.88 10*6/uL — ABNORMAL LOW (ref 4.20–5.82)
RDW: 15.7 % — AB (ref 11.0–14.6)
WBC: 6.5 10*3/uL (ref 4.0–10.3)

## 2015-10-15 ENCOUNTER — Telehealth: Payer: Self-pay | Admitting: *Deleted

## 2015-10-15 NOTE — Telephone Encounter (Signed)
CALLED PATIENT TO REMIND OF XOFIGO INJ. FOR 10-16-15 - ARRIVAL TIME - 11:45 AM @ [facility], SPOKE WITH PATIENT'S WIFE BRENDA AND SHE IS AWARE OF THIS INJ.

## 2015-10-16 ENCOUNTER — Encounter: Payer: Self-pay | Admitting: Radiation Oncology

## 2015-10-16 ENCOUNTER — Encounter (HOSPITAL_COMMUNITY)
Admission: RE | Admit: 2015-10-16 | Discharge: 2015-10-16 | Disposition: A | Discharge status: Home / Self Care | Payer: Medicare Other | Source: Ambulatory Visit | Attending: Radiation Oncology | Admitting: Radiation Oncology

## 2015-10-16 DIAGNOSIS — C61 Malignant neoplasm of prostate: Secondary | ICD-10-CM | POA: Diagnosis present

## 2015-10-16 DIAGNOSIS — C7951 Secondary malignant neoplasm of bone: Secondary | ICD-10-CM | POA: Insufficient documentation

## 2015-10-16 NOTE — Progress Notes (Signed)
  Radiation Oncology         [phone] ________________________________  Name: Alexander Ellis MRN: [id number]  Date: 10/16/2015  DOB: November 04, 1945  Radium-223 Infusion Note  Diagnosis:  Castration resistant prostate cancer with painful bone involvement  Current Infusion:    6  Planned Infusions:  6  Narrative: Alexander Ellis presented to nuclear medicine for treatment. His most recent blood counts were reviewed. He has had some recurrent nausea which he is trying to manage with his current medication. He denies any pain this time .He remains a good candidate to proceed with Ra-223.  The patient was situated in an infusion suite with a contact barrier placed under his arm. Intravenous access was established, using sterile technique, and a normal saline infusion from a syringe was started.  Micro-dosimetry:  The prescribed radiation activity was assayed and confirmed to be within specified tolerance.  Special Treatment Procedure - Infusion:  The nuclear medicine technologist and I personally verified the dose activity to be delivered as specified in the written directive, and verified the patient identification via 2 separate methods.  The syringe containing the dose was attached to a 3 way stopcock, and then the valve was opened to the patient, and the dose delivered over a minute. No complications were noted.  The total administered dose was 111.5 microcuries in a volume of 6.32 cc.  A saline flush of the line and the syringe that contained the isotope was then performed.  The residual radioactivity in the syringe was 2.90 microcuries, so the actual infused isotope activity was 108.6 microcuries.   Pressure was applied to the venipuncture site, and a compression bandage placed.   Radiation Safety personnel were present to perform the discharge survey, as detailed on their documentation.   After a short period of observation, the patient had his IV removed.  Impression:  The patient tolerated his  infusion relatively well. The patient has successfully completed Ra-223 treatments.  Plan:  Follow up in 1 month    -----------------------------------  Blair Promise, PhD, MD  This document serves as a record of services personally performed by Gery Pray, MD. It was created on his behalf by Derek Mound, a trained medical scribe. The creation of this record is based on the scribe's personal observations and the provider's statements to them. This document has been checked and approved by the attending provider.

## 2015-11-06 NOTE — Progress Notes (Incomplete)
°  Radiation Oncology         [phone] ________________________________  Name: Alexander Ellis MRN: [id number]  Date: 10/16/2015  DOB: 09/07/1945  End of Treatment Note  Diagnosis:   Castration resistant prostate cancer with painful bone involvement     Indication for treatment:  Palliative       Radiation treatment dates:   05/15/15 - 10/16/15  Site/dose: Bone Marrow (Diffuse), Six radium- 223 injections treated to 1.35 microcuries/kg per injection.  Narrative: The patient tolerated Ra-223 infusion relatively well.   Plan: The patient has completed radiation treatment. The patient will return to radiation oncology clinic for routine followup in one month. I advised them to call or return sooner if they have any questions or concerns related to their recovery or treatment.  -----------------------------------  Blair Promise, PhD, MD  This document serves as a record of services personally performed by Gery Pray, MD. It was created on his behalf by Derek Mound, a trained medical scribe. The creation of this record is based on the scribe's personal observations and the provider's statements to them. This document has been checked and approved by the attending provider.

## 2015-11-08 ENCOUNTER — Encounter: Payer: Self-pay | Admitting: Radiation Oncology

## 2016-05-08 ENCOUNTER — Ambulatory Visit: Payer: Medicare Other | Admitting: Radiation Oncology

## 2016-07-29 ENCOUNTER — Telehealth: Payer: Self-pay | Admitting: *Deleted

## 2016-07-29 NOTE — Telephone Encounter (Signed)
LVM for patient to call back in regards to scheduling AWV with our health coach.  

## 2017-07-12 ENCOUNTER — Other Ambulatory Visit: Payer: Self-pay

## 2017-07-12 DIAGNOSIS — I6522 Occlusion and stenosis of left carotid artery: Secondary | ICD-10-CM

## 2017-07-25 NOTE — Progress Notes (Addendum)
Cardiology Office Note    Date:  07/26/2017   ID:  Alexander Ellis, DOB 27-Jan-1946, MRN [id number]  PCP:  [name], NP  Cardiologist: Larae Grooms, MD  Chief Complaint  Patient presents with  . New Patient (Initial Visit)    History of Present Illness:  Alexander Ellis is a 72 y.o. 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  He was referred to [place] from Dr. Olean Ree for ocular ischemic syndrome.  He has awakened with complete loss of vision every morning for the past 3-4 weeks.  It lasts a short time and then resolves he gets up to walk around.  CT scan was negative, neuro evaluation in the ED setting recommended MRI and MRA of the brain.  Patient was referred to ophthalmology who diagnosed him with ocular ischemic syndrome and recommended MRI/MRA of the brain and carotids cardiology referral.  MRI/6/19 shows microvascular ischemia and mild atrophy.  Carotids 70% left carotid he has an appointment with Dr. Oneida Alar March 14 after repeat carotids are done.  He has a 4.1 cm infrarenal abdominal aortic aneurysm with extensive atherosclerotic disease.  Patient is here accompanied by his wife.  He denies any history of chest pain, palpitations, dyspnea, dyspnea on exertion, dizziness or presyncope.  He says he is not very active but can do anything he wants.  He still does his own yard work and can walk city blocks and upstairs.  He has a 60-pack-year history of smoking and continues to smoke 1/2 pack of cigarettes a day and has tried everything to quit including hypnosis.  He is scheduled for bronchoscopy next week because of metastatic prostate cancer.    Past Medical History:  Diagnosis Date  . Anemia   . Aneurysm of abdominal aorta (HCC)   . Bone metastasis ([place])   . Cancer [facility]) 2014   prostate  . Chronic hepatitis B virus infection ([place])   . Cirrhosis ([place])   . COPD (chronic obstructive  pulmonary disease) ([place])   . Dyspnea   . GERD (gastroesophageal reflux disease)   . GERD (gastroesophageal reflux disease)   . Hand pain   . Hepatitis   . Hypotension   . Lung nodules   . Osteoarthritis     Past Surgical History:  Procedure Laterality Date  . APPENDECTOMY    . BACK SURGERY     [phone]  . PROSTATECTOMY  2012  . THROAT SURGERY    . TONSILLECTOMY      Current Medications: Current Meds  Medication Sig  . abiraterone acetate (ZYTIGA) 250 MG tablet Take by mouth.  . Cholecalciferol (VITAMIN D-3) 1000 UNITS CAPS Take 1 capsule by mouth daily.  . dorzolamide-timolol (COSOPT) 22.3-6.8 MG/ML ophthalmic solution instill 1 DROP IN EACH EYE TWICE (2) DAILY  . milk thistle 175 MG tablet Take 175 mg by mouth daily.  Marland Kitchen OVER THE COUNTER MEDICATION Take 1 capsule by mouth 2 (two) times daily.   . predniSONE (DELTASONE) 5 MG tablet TAKE 1 TABLET BY MOUTH DAILY.  Marland Kitchen prochlorperazine (COMPAZINE) 10 MG tablet Take 1 tablet by mouth every 6-8 hours as needed for nausea  . ranitidine (ZANTAC) 300 MG tablet Take by mouth.  . vitamin B-12 (CYANOCOBALAMIN) 100 MCG tablet Take 100 mcg by mouth daily.  . [DISCONTINUED] dorzolamide-timolol (COSOPT) 22.3-6.8 MG/ML ophthalmic solution Place 1 drop into both eyes every morning     Allergies:   Penicillins;  Other; Hydrocodone-acetaminophen; Codeine; and Oxycodone   Social History   Socioeconomic History  . Marital status: Married    Spouse name: None  . Number of children: None  . Years of education: None  . Highest education level: None  Social Needs  . Financial resource strain: None  . Food insecurity - worry: None  . Food insecurity - inability: None  . Transportation needs - medical: None  . Transportation needs - non-medical: None  Occupational History  . None  Tobacco Use  . Smoking status: Current Every Day Smoker  . Smokeless tobacco: Never Used  . Tobacco comment: pack a day  Substance and Sexual Activity   . Alcohol use: No    Alcohol/week: 0.0 oz  . Drug use: No  . Sexual activity: None  Other Topics Concern  . None  Social History Narrative  . None     Family History:  The patient's family history includes Heart disease in his mother; Hypertension in his mother.   ROS:   Please see the history of present illness.    Review of Systems  Constitution: Negative.  HENT: Negative.   Eyes: Positive for vision loss in left eye, vision loss in right eye and visual disturbance.  Cardiovascular: Negative.   Respiratory: Negative.   Endocrine: Negative.   Hematologic/Lymphatic: Bruises/bleeds easily.  Musculoskeletal: Negative.   Gastrointestinal: Negative.   Genitourinary: Negative.   Neurological: Positive for dizziness.   All other systems reviewed and are negative.   PHYSICAL EXAM:   VS:  BP 140/78 (BP Location: Left Arm, Patient Position: Sitting, Cuff Size: Normal)   Pulse 69   Ht 5\' 9"  (1.753 m)   Wt 140 lb (63.5 kg)   SpO2 99%   BMI 20.67 kg/m   Physical Exam  GEN: Well nourished, well developed, in no acute distress  HEENT: eyes blood shot, cataracts. Neck: no JVD, carotid bruits, or masses Cardiac:RRR; 1/6 systolic murmur Respiratory:  clear to auscultation bilaterally, normal work of breathing GI: soft, nontender, nondistended, + BS Ext: without cyanosis, clubbing, or edema, Good distal pulses bilaterally MS: no deformity or atrophy  Skin: warm and dry, no rash Neuro:  Alert and Oriented x 3, Strength and sensation are intact Psych: euthymic mood, full affect  Wt Readings from Last 3 Encounters:  07/26/17 140 lb (63.5 kg)  06/06/15 169 lb (76.7 kg)  05/09/15 174 lb 6.4 oz (79.1 kg)      Studies/Labs Reviewed:   EKG:  EKG is  ordered today.  The ekg ordered today demonstrates normal sinus rhythm normal EKG  Recent Labs: No results found for requested labs within last 8760 hours.   Lipid Panel    Component Value Date/Time   CHOL 147 10/15/2010 1228    TRIG 83 10/15/2010 1228   HDL 52 10/15/2010 1228   CHOLHDL 2.8 10/15/2010 1228   VLDL 17 10/15/2010 1228   LDLCALC 78 10/15/2010 1228    Additional studies/ records that were reviewed today include:  Bone scan 06/2017 CONCLUSION:  Bone metastases in the axial and proximal appendicular skeleton. CT chest abdomen and pelvis 06/2017 CHEST: .Thoracic inlet/axilla: Within normal limits. .Central airways: Within normal limits. .Mediastinum/hila: Within normal limits.  .Heart/vessel: Normal heart size. No pericardial effusion. Aorta normal in caliber and appearance. Similar subendocardial fat along the left ventricular apex which may represent sequela of prior infarction. Coronary artery calcifications. .Lungs: Redemonstrated extensive bilateral emphysema and peripheral reticulation/scarring. Significant interval enlargement of the previously described right lower lobe lung  nodule, previously measuring 1.1 x 1.1 cm, now measuring 1.9 x 1.6 cm (series 3 image 374). Similar appearance of the irregular nodule within the posterior lateral aspect of the left lower lobe. Interval development of a left apical subpleural nodule measuring approximately 0.9 cm (series 3 image 1:15). .Pleura: Within normal limits.   ABDOMEN: .Liver: Within normal limits. .Gallbladder/biliary: Similar cholelithiasis without evidence of acute cholecystitis. The biliary system is within normal limits. .Spleen: Within normal limits. .Pancreas: Within normal limits. .Adrenals: Similar appearance of bilateral small low-attenuation adrenal nodules similar to prior. .Kidneys: Similar appearance of the bilateral low attenuating cystic lesions. While incompletely characterized by noncontrast technique, these are favored to represent renal cysts. Small nonobstructing left renal calculus in the collecting system of the inferior pole. No evidence of hydronephrosis. .Peritoneum: Within normal  limits. .Mesentery: Within normal limits. .Extraperitoneum: Within normal limits. .GI tract: No evidence of obstruction.  .Vascular: Similar size of the infrarenal abdominal aortic aneurysm measuring 4.1 cm in transverse dimension on coronal reconstruction, similar to prior. 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 Redemonstrated extensive aortobiiliac atherosclerosis as well as calcifications of the left renal artery.  PELVIS: .Ureters: Within normal limits. .Bladder: Within normal limits. .Reproductive system: Postsurgical changes of prostatectomy.  MSK: .There is subtle worsening of the osseous lesions of the T6 vertebral body, which is relatively spared. There is otherwise similar extensive mixed sclerotic/lytic osseous metastatic disease of the thoracolumbar spine and pelvis. Similar height loss of the T12 vertebral body. No acute osseous abnormality.   ASSESSMENT:    1. Abdominal aortic aneurysm (AAA) without rupture ([place])   2. Ocular ischemic syndrome   3. Stenosis of left carotid artery   4. Tobacco use disorder   5. Prostate cancer metastatic to bone [facility])      PLAN:  In order of problems listed above:  Abdominal aortic aneurysm 4.1 cm stable extensive atherosclerosis on CT.  Patient asymptomatic from a cardiac standpoint.  Ocular ischemic syndrome with blindness when he wakes up in the morning but clears after he starts moving around.  Found to have 70% left ICA stenosis on ultrasound.  Extensive workup including CT scans and MRIs done in [place] and have been reviewed.  Scheduled to see Dr. Oneida Alar for repeat ultrasound March 12 in follow-up on March 14.  Recommend baby aspirin once daily and Lipitor 10 mg once daily.  Follow-up LFTs and lipids in 6 weeks can be done by primary care.  Depending on what type of surgery he needs will determine if he needs any further cardiac workup.  He is asymptomatic from a cardiac standpoint  but has multiple risk factors for CAD.  His functional capacity is good with a METs of 6.61.  No further workup recommended at this time.  Dr. Irish Lack examined the patient and spoke to him as well.  His Functional Capacity in METs is: 6.61 according to the Duke Activity Status Index (DASI).  Tobacco abuse disorder 60-pack-year history of smoking trying to cut back to half a pack daily.  Smoking cessation essential.  Metastatic prostate cancer to the bone and lungs scheduled for bronchoscopy in the near future.  Medication Adjustments/Labs and Tests Ordered: Current medicines are reviewed at length with the patient today.  Concerns regarding medicines are outlined above.  Medication changes, Labs and Tests ordered today are listed in the Patient Instructions below. Patient Instructions  Medication Instructions:  Your physician has recommended you make the following change in your medication:  1.  START Lipitor 10 mg take 1 tablet daily   Labwork: 6 WEEKS AT YOUR FAMILY DR:  FASTING LIPIDS &LFT'S   Testing/Procedures: None ordered  Follow-Up: Your physician recommends that you schedule a follow-up appointment in:  AS NEEDED   Any Other Special Instructions Will Be Listed Below (If Applicable).    If you need a refill on your cardiac medications before your next appointment, please call your pharmacy.      Sumner Boast, PA-C  07/26/2017 9:58 AM    [facility] [place], [place], [place]  [address] Phone: [phone]; Fax: [phone]   I have examined the patient and reviewed assessment and plan and discussed with patient.  Agree with above as stated.  Further plans based on vascular surgery.  Will see what type of preop eval he may need based on whether he needs any type of procedure. He should be on a statin given vascular disease.  No overt cardiac symptoms at this time, even with fairly vigorous activity.  Larae Grooms

## 2017-07-26 ENCOUNTER — Ambulatory Visit (INDEPENDENT_AMBULATORY_CARE_PROVIDER_SITE_OTHER): Payer: Medicare Other | Admitting: Physician Assistant

## 2017-07-26 VITALS — BP 140/78 | HR 69 | Ht 69.0 in | Wt 140.0 lb

## 2017-07-26 DIAGNOSIS — C7951 Secondary malignant neoplasm of bone: Secondary | ICD-10-CM | POA: Diagnosis not present

## 2017-07-26 DIAGNOSIS — F17200 Nicotine dependence, unspecified, uncomplicated: Secondary | ICD-10-CM

## 2017-07-26 DIAGNOSIS — I714 Abdominal aortic aneurysm, without rupture, unspecified: Secondary | ICD-10-CM

## 2017-07-26 DIAGNOSIS — H3582 Retinal ischemia: Secondary | ICD-10-CM

## 2017-07-26 DIAGNOSIS — I6522 Occlusion and stenosis of left carotid artery: Secondary | ICD-10-CM

## 2017-07-26 DIAGNOSIS — I6529 Occlusion and stenosis of unspecified carotid artery: Secondary | ICD-10-CM | POA: Insufficient documentation

## 2017-07-26 DIAGNOSIS — C61 Malignant neoplasm of prostate: Secondary | ICD-10-CM

## 2017-07-26 MED ORDER — ATORVASTATIN CALCIUM 10 MG PO TABS: 10.0000 mg | ORAL_TABLET | Freq: Every day | ORAL | 3 refills | Status: DC

## 2017-07-26 NOTE — Patient Instructions (Addendum)
Medication Instructions:  Your physician has recommended you make the following change in your medication:  1.  START Lipitor 10 mg take 1 tablet daily   Labwork: 6 WEEKS AT YOUR FAMILY DR:  FASTING LIPIDS &LFT'S   Testing/Procedures: None ordered  Follow-Up: Your physician recommends that you schedule a follow-up appointment in:  AS NEEDED   Any Other Special Instructions Will Be Listed Below (If Applicable).    If you need a refill on your cardiac medications before your next appointment, please call your pharmacy.

## 2017-08-02 ENCOUNTER — Encounter (HOSPITAL_COMMUNITY): Payer: Medicare Other

## 2017-08-04 ENCOUNTER — Encounter: Payer: Medicare Other | Admitting: Vascular Surgery

## 2017-08-25 ENCOUNTER — Other Ambulatory Visit: Payer: Self-pay | Admitting: Hematology & Oncology

## 2017-08-25 ENCOUNTER — Telehealth: Payer: Self-pay | Admitting: Hematology & Oncology

## 2017-08-25 DIAGNOSIS — C7951 Secondary malignant neoplasm of bone: Principal | ICD-10-CM

## 2017-08-25 DIAGNOSIS — C61 Malignant neoplasm of prostate: Secondary | ICD-10-CM

## 2017-08-25 NOTE — Telephone Encounter (Signed)
lmom on both phone numbers to inform pt of lab/MD appt 08/27/47 at 3:15 pm

## 2017-08-26 ENCOUNTER — Inpatient Hospital Stay: Payer: Medicare Other | Attending: Hematology & Oncology | Admitting: Hematology & Oncology

## 2017-08-26 ENCOUNTER — Other Ambulatory Visit: Payer: Self-pay

## 2017-08-26 ENCOUNTER — Encounter: Payer: Self-pay | Admitting: Hematology & Oncology

## 2017-08-26 ENCOUNTER — Inpatient Hospital Stay: Payer: Medicare Other

## 2017-08-26 VITALS — BP 156/79 | HR 73 | Temp 97.3°F | Resp 18 | Wt 141.0 lb

## 2017-08-26 DIAGNOSIS — G893 Neoplasm related pain (acute) (chronic): Secondary | ICD-10-CM | POA: Diagnosis not present

## 2017-08-26 DIAGNOSIS — D500 Iron deficiency anemia secondary to blood loss (chronic): Secondary | ICD-10-CM

## 2017-08-26 DIAGNOSIS — Z5189 Encounter for other specified aftercare: Secondary | ICD-10-CM | POA: Insufficient documentation

## 2017-08-26 DIAGNOSIS — C61 Malignant neoplasm of prostate: Secondary | ICD-10-CM

## 2017-08-26 DIAGNOSIS — F17210 Nicotine dependence, cigarettes, uncomplicated: Secondary | ICD-10-CM | POA: Diagnosis not present

## 2017-08-26 DIAGNOSIS — C7951 Secondary malignant neoplasm of bone: Secondary | ICD-10-CM | POA: Diagnosis not present

## 2017-08-26 DIAGNOSIS — Z79899 Other long term (current) drug therapy: Secondary | ICD-10-CM | POA: Diagnosis not present

## 2017-08-26 DIAGNOSIS — R530 Neoplastic (malignant) related fatigue: Secondary | ICD-10-CM | POA: Diagnosis not present

## 2017-08-26 DIAGNOSIS — Z5111 Encounter for antineoplastic chemotherapy: Secondary | ICD-10-CM | POA: Insufficient documentation

## 2017-08-26 LAB — CMP (CANCER CENTER ONLY)
ALBUMIN: 3.2 g/dL — AB (ref 3.5–5.0)
ALK PHOS: 109 U/L — AB (ref 26–84)
ALT: 12 U/L (ref 10–47)
AST: 15 U/L (ref 11–38)
Anion gap: 7 (ref 5–15)
BILIRUBIN TOTAL: 0.8 mg/dL (ref 0.2–1.6)
BUN: 16 mg/dL (ref 7–22)
CHLORIDE: 103 mmol/L (ref 98–108)
CO2: 30 mmol/L (ref 18–33)
CREATININE: 1.1 mg/dL (ref 0.60–1.20)
Calcium: 9.1 mg/dL (ref 8.0–10.3)
Glucose, Bld: 103 mg/dL (ref 73–118)
POTASSIUM: 4.3 mmol/L (ref 3.3–4.7)
Sodium: 140 mmol/L (ref 128–145)
Total Protein: 7.5 g/dL (ref 6.4–8.1)

## 2017-08-26 LAB — CBC WITH DIFFERENTIAL (CANCER CENTER ONLY)
Basophils Absolute: 0 10*3/uL (ref 0.0–0.1)
Basophils Relative: 0 %
Eosinophils Absolute: 0.1 10*3/uL (ref 0.0–0.5)
Eosinophils Relative: 1 %
HEMATOCRIT: 33.8 % — AB (ref 38.7–49.9)
HEMOGLOBIN: 11 g/dL — AB (ref 13.0–17.1)
LYMPHS ABS: 1.5 10*3/uL (ref 0.9–3.3)
Lymphocytes Relative: 15 %
MCH: 32.9 pg (ref 28.0–33.4)
MCHC: 32.5 g/dL (ref 32.0–35.9)
MCV: 101.2 fL — ABNORMAL HIGH (ref 82.0–98.0)
MONOS PCT: 8 %
Monocytes Absolute: 0.8 10*3/uL (ref 0.1–0.9)
NEUTROS ABS: 7.7 10*3/uL — AB (ref 1.5–6.5)
NEUTROS PCT: 76 %
Platelet Count: 361 10*3/uL (ref 145–400)
RBC: 3.34 MIL/uL — ABNORMAL LOW (ref 4.20–5.70)
RDW: 15.2 % (ref 11.1–15.7)
WBC Count: 10.1 10*3/uL — ABNORMAL HIGH (ref 4.0–10.0)

## 2017-08-26 MED ORDER — DRONABINOL 2.5 MG PO CAPS: 2.5000 mg | ORAL_CAPSULE | Freq: Two times a day (BID) | ORAL | 0 refills | Status: DC

## 2017-08-26 NOTE — Progress Notes (Signed)
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 Follow-up  : My prostate cancer is getting worse.  HPI: Alexander Ellis is a very nice 72 year old white male.  I have not seen him for over 2 years.  I saw him back in 2016.  At that time he had metastatic castrate resistant prostate cancer.  He has had multiple therapies.  We had seen him, he was treated with Xofigo.  He could not tolerate Xtandi.  He had been on Zytiga.  He has been going to [facility].  He was found to have progressive disease.  He had some lung nodules and mediastinal adenopathy.  His PSA he said was going up.  In February 2019, his PSA was 101.  He has had scans done.  This was done at [facility].  Again it showed that he had some lung nodules and mediastinal lymph nodes.  He did have a biopsy done at [facility].  He initially had a percutaneous biopsy back in May 2018.  Unfortunately, the pathology report ([id number] showed) adeno carcinoma but no further studies could be done. He was had some recent studies done.  I cannot find any report.  However, his wife says that she was called by the pulmonologist from [facility].  She said that he told her that the pathologist at [facility] said that this was prostate cancer.  Apparently, they did not seem to be satisfied with the response by his oncologist at [facility].  As such, he wanted to come back to see [place].  He just feels tired.  He does not have a lot of energy.  To me, looks like he is lost some weight.  His appetite is marginal.  I did give him a prescription for Marinol (2.5 mg p.o. twice daily) to take.  He has had no cough.  He has had no change in bowel or bladder habits.  He has had no diarrhea.  He has had no leg swelling.  He has been getting testosterone depletion injections every 6 months.  We are checking his testosterone level today.  Overall, I said his performance status is ECOG 1-2.    Past  Medical History:  Diagnosis Date  . Anemia   . Aneurysm of abdominal aorta (HCC)   . Bone metastasis ([place])   . Cancer [facility]) 2014   prostate  . Chronic hepatitis B virus infection ([place])   . Cirrhosis ([place])   . COPD (chronic obstructive pulmonary disease) ([place])   . Dyspnea   . GERD (gastroesophageal reflux disease)   . GERD (gastroesophageal reflux disease)   . Hand pain   . Hepatitis   . Hypotension   . Lung nodules   . Osteoarthritis   :  Past Surgical History:  Procedure Laterality Date  . APPENDECTOMY    . BACK SURGERY     [phone]  . PROSTATECTOMY  2012  . THROAT SURGERY    . TONSILLECTOMY    :   Current Outpatient Medications:  .  varenicline (CHANTIX PAK) 0.5 MG X 11 & 1 MG X 42 tablet, Take one 0.5 mg tab by mouth once daily for 3 days, increase to one 0.5 mg tab twice daily for 4 days, increase to 1 mg tab twice daily., Disp: , Rfl:  .  abiraterone acetate (ZYTIGA) 250 MG tablet, Take by mouth., Disp: , Rfl:  .  atorvastatin (LIPITOR) 10 MG tablet, Take 1 tablet (10 mg total) by  mouth daily., Disp: 90 tablet, Rfl: 3 .  AZOPT 1 % ophthalmic suspension, INSTILL 1 DROP TWICE A DAY INTO LEFT EYE, Disp: , Rfl: 0 .  brimonidine (ALPHAGAN) 0.2 % ophthalmic solution, INSTILL 1 DROP BY OPHTHALMIC ROUTE THREE TIMES DAILY, Disp: , Rfl: 5 .  Cholecalciferol (VITAMIN D-3) 1000 UNITS CAPS, Take 1 capsule by mouth daily., Disp: , Rfl:  .  COMBIGAN 0.2-0.5 % ophthalmic solution, PLACE 1 DROP IN EACH EYE TWICE (2) DAILY, Disp: , Rfl: 4 .  dorzolamide-timolol (COSOPT) 22.3-6.8 MG/ML ophthalmic solution, instill 1 DROP IN EACH EYE TWICE (2) DAILY, Disp: , Rfl: 0 .  dronabinol (MARINOL) 2.5 MG capsule, Take 1 capsule (2.5 mg total) by mouth 2 (two) times daily before lunch and supper., Disp: 60 capsule, Rfl: 0 .  DUREZOL 0.05 % EMUL, PLACE 1 DROP IN THE RIGHT EYE FOUR (4) TIMES DAILY UNTIL TOLD OTHERWISE, Disp: , Rfl: 2 .  milk thistle 175 MG tablet, Take 175 mg by mouth  daily., Disp: , Rfl:  .  ofloxacin (OCUFLOX) 0.3 % ophthalmic solution, USE 1 DROP IN RIGHT EYE 3 TIMES A DAY, Disp: , Rfl: 2 .  OVER THE COUNTER MEDICATION, Take 1 capsule by mouth 2 (two) times daily. , Disp: , Rfl:  .  predniSONE (DELTASONE) 5 MG tablet, TAKE 1 TABLET BY MOUTH DAILY., Disp: , Rfl:  .  prochlorperazine (COMPAZINE) 10 MG tablet, Take 1 tablet by mouth every 6-8 hours as needed for nausea, Disp: , Rfl:  .  ranitidine (ZANTAC) 300 MG tablet, Take by mouth., Disp: , Rfl:  .  vitamin B-12 (CYANOCOBALAMIN) 100 MCG tablet, Take 100 mcg by mouth daily., Disp: , Rfl: :  :  Allergies  Allergen Reactions  . Penicillins Hives  . Other Nausea Only and Other (See Comments)    Uncoded Allergy. Allergen: IV contrast  . Hydrocodone-Acetaminophen Nausea And Vomiting  . Codeine Nausea And Vomiting  . Oxycodone Other (See Comments)    somnolence  :  Family History  Problem Relation Age of Onset  . Heart disease Mother   . Hypertension Mother   :  Social History   Socioeconomic History  . Marital status: Married    Spouse name: Not on file  . Number of children: Not on file  . Years of education: Not on file  . Highest education level: Not on file  Occupational History  . Not on file  Social Needs  . Financial resource strain: Not on file  . Food insecurity:    Worry: Not on file    Inability: Not on file  . Transportation needs:    Medical: Not on file    Non-medical: Not on file  Tobacco Use  . Smoking status: Current Every Day Smoker  . Smokeless tobacco: Never Used  . Tobacco comment: pack a day  Substance and Sexual Activity  . Alcohol use: No    Alcohol/week: 0.0 oz  . Drug use: No  . Sexual activity: Not on file  Lifestyle  . Physical activity:    Days per week: Not on file    Minutes per session: Not on file  . Stress: Not on file  Relationships  . Social connections:    Talks on phone: Not on file    Gets together: Not on file    Attends religious  service: Not on file    Active member of club or organization: Not on file    Attends meetings of clubs or organizations:  Not on file    Relationship status: Not on file  . Intimate partner violence:    Fear of current or ex partner: Not on file    Emotionally abused: Not on file    Physically abused: Not on file    Forced sexual activity: Not on file  Other Topics Concern  . Not on file  Social History Narrative  . Not on file  :  Review of Systems  Constitutional: Positive for malaise/fatigue.  HENT: Negative.   Eyes: Positive for blurred vision and discharge.  Respiratory: Negative.   Cardiovascular: Negative.   Gastrointestinal: Negative.   Genitourinary: Negative.   Musculoskeletal: Positive for joint pain and myalgias.  Skin: Negative.   Neurological: Negative.   Endo/Heme/Allergies: Negative.   Psychiatric/Behavioral: Negative.      Exam: Thin white male in no obvious distress.  Vital signs are temperature of 97.3.  Pulse 73.  Blood pressure 156/79.  Weight is 141 pounds.  Head neck exam shows no ocular or oral lesions.  There are no palpable cervical or supraclavicular lymph nodes.  Lungs are clear bilaterally.  Cardiac exam regular rate and rhythm with no murmurs, rubs or bruits.  Abdomen is soft.  Has good bowel sounds.  There is no fluid wave.  There is no palpable liver or spleen tip.  Back exam shows a laminectomy scar in the lumbar spine.  He has no tenderness over the spine, ribs or hips.  Extremities shows no clubbing, cyanosis or edema.  He may have some muscle atrophy in upper and lower extremities.  Skin exam shows no rashes, ecchymoses or petechia.  Neurological exam shows no focal neurological deficits. @IPVITALS @   Recent Labs    08/26/17 1521  WBC 10.1*  HCT 33.8*  PLT 361   Recent Labs    08/26/17 1521  NA 140  K 4.3  CL 103  CO2 30  GLUCOSE 103  BUN 16  CREATININE 1.10  CALCIUM 9.1    Blood smear review: None  Pathology:  None    Assessment and Plan: Alexander Ellis is a 72 year old white male.  He has progressive castrate resistant metastatic prostate cancer.  I think the option now is systemic chemotherapy.  I think Taxotere would be reasonable.  I do not think he would tolerate full dose Taxotere.  I talked to he and his wife about Taxotere.  I explained the side effects.  Unfortunately, we did not have the ability to give him any information sheets about Taxotere.  I did tell him that he will need to have a white cell booster shot after each dose of Taxotere.  I told him it would cause his hair to come out.  I told him that he could certainly have fatigue.  He may need to be transfused.  He understands all of this.  He will need to have a Port-A-Cath placed.  I do think a PET scan with Axumin is appropriate.  I think this can give [place] an idea as to how extensive his disease really is.  We also can use this for follow-up to assess for response  I will try to get started with treatment in a couple weeks.  I told his wife that I do not think that 2 weeks would make a difference.  We need to make sure that we get everything set up for him.  I spent about an hour with he and his wife.  Over half the time was spent face-to-face with them  talking about chemotherapy, side effects,  They understand very well that this is not curative.  Our goal of care is for quality of life and to have some quantity of life.  I just want him to be able to do things that he would like to do and can enjoy.  He feels the same way.  We will plan to get him back to see [place] when he comes in for his first cycle of therapy.

## 2017-08-26 NOTE — Progress Notes (Signed)
START ON PATHWAY REGIMEN - Prostate     A cycle is every 21 days:     Docetaxel      Prednisone   **Always confirm dose/schedule in your pharmacy ordering system**    Patient Characteristics: Adenocarcinoma, Metastatic, Castration Resistant, Symptomatic, Docetaxel Eligible Current radiographic evidence of distant metastasis<= Yes Histology: Adenocarcinoma AJCC T Category: cT3 Gleason Primary: 4 AJCC N Category: N1 Gleason Secondary: 4 AJCC M Category: M1c Gleason Score: 8 AJCC 8 Stage Grouping: IVB PSA Values (ng/mL): ? 20  Intent of Therapy: Non-Curative / Palliative Intent, Discussed with Patient

## 2017-08-27 LAB — TESTOSTERONE: Testosterone: <3 ng/dL — ABNORMAL LOW (ref 264–916)

## 2017-08-27 LAB — PROSTATE-SPECIFIC AG, SERUM (LABCORP): Prostate Specific Ag, Serum: 140.9 ng/mL — ABNORMAL HIGH (ref 0.0–4.0)

## 2017-08-29 LAB — CEA (IN HOUSE-CHCC): CEA (CHCC-IN HOUSE): 14.56 ng/mL — AB (ref 0.00–5.00)

## 2017-08-30 ENCOUNTER — Encounter: Payer: Self-pay | Admitting: Family

## 2017-09-04 ENCOUNTER — Other Ambulatory Visit: Payer: Self-pay | Admitting: Radiology

## 2017-09-05 ENCOUNTER — Other Ambulatory Visit: Payer: Self-pay | Admitting: Radiology

## 2017-09-06 ENCOUNTER — Ambulatory Visit (HOSPITAL_COMMUNITY)
Admission: RE | Admit: 2017-09-06 | Discharge: 2017-09-06 | Disposition: A | Discharge status: Home / Self Care | Payer: Medicare Other | Source: Ambulatory Visit | Attending: Hematology & Oncology | Admitting: Hematology & Oncology

## 2017-09-06 ENCOUNTER — Other Ambulatory Visit: Payer: Self-pay | Admitting: Hematology & Oncology

## 2017-09-06 ENCOUNTER — Encounter (HOSPITAL_COMMUNITY): Payer: Self-pay

## 2017-09-06 DIAGNOSIS — J449 Chronic obstructive pulmonary disease, unspecified: Secondary | ICD-10-CM | POA: Insufficient documentation

## 2017-09-06 DIAGNOSIS — Z7952 Long term (current) use of systemic steroids: Secondary | ICD-10-CM | POA: Diagnosis not present

## 2017-09-06 DIAGNOSIS — M1990 Unspecified osteoarthritis, unspecified site: Secondary | ICD-10-CM | POA: Diagnosis not present

## 2017-09-06 DIAGNOSIS — K7460 Unspecified cirrhosis of liver: Secondary | ICD-10-CM | POA: Insufficient documentation

## 2017-09-06 DIAGNOSIS — C61 Malignant neoplasm of prostate: Secondary | ICD-10-CM

## 2017-09-06 DIAGNOSIS — Z885 Allergy status to narcotic agent status: Secondary | ICD-10-CM | POA: Diagnosis not present

## 2017-09-06 DIAGNOSIS — K219 Gastro-esophageal reflux disease without esophagitis: Secondary | ICD-10-CM | POA: Insufficient documentation

## 2017-09-06 DIAGNOSIS — D500 Iron deficiency anemia secondary to blood loss (chronic): Secondary | ICD-10-CM

## 2017-09-06 DIAGNOSIS — C7951 Secondary malignant neoplasm of bone: Secondary | ICD-10-CM | POA: Diagnosis not present

## 2017-09-06 DIAGNOSIS — Z880 Allergy status to penicillin: Secondary | ICD-10-CM | POA: Diagnosis not present

## 2017-09-06 DIAGNOSIS — B181 Chronic viral hepatitis B without delta-agent: Secondary | ICD-10-CM | POA: Diagnosis not present

## 2017-09-06 HISTORY — PX: IR FLUORO GUIDE PORT INSERTION RIGHT: IMG5741

## 2017-09-06 HISTORY — PX: IR US GUIDE VASC ACCESS RIGHT: IMG2390

## 2017-09-06 LAB — CBC WITH DIFFERENTIAL/PLATELET
Basophils Absolute: 0 10*3/uL (ref 0.0–0.1)
Basophils Relative: 0 %
EOS PCT: 2 %
Eosinophils Absolute: 0.2 10*3/uL (ref 0.0–0.7)
HEMATOCRIT: 40.7 % (ref 39.0–52.0)
Hemoglobin: 13.2 g/dL (ref 13.0–17.0)
LYMPHS ABS: 1.6 10*3/uL (ref 0.7–4.0)
LYMPHS PCT: 17 %
MCH: 32.4 pg (ref 26.0–34.0)
MCHC: 32.4 g/dL (ref 30.0–36.0)
MCV: 100 fL (ref 78.0–100.0)
Monocytes Absolute: 0.9 10*3/uL (ref 0.1–1.0)
Monocytes Relative: 10 %
NEUTROS ABS: 6.8 10*3/uL (ref 1.7–7.7)
Neutrophils Relative %: 71 %
PLATELETS: 343 10*3/uL (ref 150–400)
RBC: 4.07 MIL/uL — AB (ref 4.22–5.81)
RDW: 16 % — ABNORMAL HIGH (ref 11.5–15.5)
WBC: 9.4 10*3/uL (ref 4.0–10.5)

## 2017-09-06 LAB — PROTIME-INR
INR: 0.94
Prothrombin Time: 12.5 seconds (ref 11.4–15.2)

## 2017-09-06 MED ORDER — LIDOCAINE-EPINEPHRINE (PF) 2 %-1:200000 IJ SOLN
INTRAMUSCULAR | Status: AC
  Filled 2017-09-06: qty 20

## 2017-09-06 MED ORDER — MIDAZOLAM HCL 2 MG/2ML IJ SOLN
INTRAMUSCULAR | Status: AC
  Filled 2017-09-06: qty 4

## 2017-09-06 MED ORDER — LIDOCAINE HCL (PF) 1 % IJ SOLN
INTRAMUSCULAR | Status: AC | PRN
  Administered 2017-09-06: 30 mL

## 2017-09-06 MED ORDER — MIDAZOLAM HCL 2 MG/2ML IJ SOLN
INTRAMUSCULAR | Status: AC | PRN
  Administered 2017-09-06: 0.5 mg via INTRAVENOUS
  Administered 2017-09-06: 1 mg via INTRAVENOUS

## 2017-09-06 MED ORDER — LIDOCAINE HCL (PF) 1 % IJ SOLN
INTRAMUSCULAR | Status: AC
  Filled 2017-09-06: qty 30

## 2017-09-06 MED ORDER — FENTANYL CITRATE (PF) 100 MCG/2ML IJ SOLN
INTRAMUSCULAR | Status: AC
  Filled 2017-09-06: qty 2

## 2017-09-06 MED ORDER — CLINDAMYCIN PHOSPHATE 600 MG/50ML IV SOLN
600.0000 mg | Freq: Once | INTRAVENOUS | Status: AC
  Administered 2017-09-06: 600 mg via INTRAVENOUS
  Filled 2017-09-06: qty 50

## 2017-09-06 MED ORDER — HEPARIN SOD (PORK) LOCK FLUSH 100 UNIT/ML IV SOLN
INTRAVENOUS | Status: AC
  Filled 2017-09-06: qty 5

## 2017-09-06 MED ORDER — SODIUM CHLORIDE 0.9 % IV SOLN
INTRAVENOUS | Status: DC
  Administered 2017-09-06: 12:00:00 via INTRAVENOUS

## 2017-09-06 MED ORDER — FENTANYL CITRATE (PF) 100 MCG/2ML IJ SOLN
INTRAMUSCULAR | Status: AC | PRN
  Administered 2017-09-06: 50 ug via INTRAVENOUS
  Administered 2017-09-06: 25 ug via INTRAVENOUS

## 2017-09-06 MED ORDER — CLINDAMYCIN PHOSPHATE 900 MG/50ML IV SOLN
INTRAVENOUS | Status: AC
  Filled 2017-09-06: qty 50

## 2017-09-06 MED ORDER — HEPARIN SOD (PORK) LOCK FLUSH 100 UNIT/ML IV SOLN
INTRAVENOUS | Status: AC | PRN
  Administered 2017-09-06: 500 [IU] via INTRAVENOUS

## 2017-09-06 NOTE — H&P (Signed)
Referring Physician(s): Ennever,Peter R  Supervising Physician: Markus Daft  Patient Status:  [facility]  Chief Complaint: "I'm here to get a port a cath"   Subjective: Patient familiar to IR service from prior transjugular liver biopsy in 2003.  He has a history of progressive castrate resistant metastatic prostate carcinoma and presents again today for Port-A-Cath placement for palliative chemotherapy.  He currently denies fever, headache, chest pain, dyspnea, abdominal/back pain, nausea, vomiting or bleeding.  Past Medical History:  Diagnosis Date  . Anemia   . Aneurysm of abdominal aorta (HCC)   . Bone metastasis ([place])   . Cancer [facility]) 2014   prostate  . Chronic hepatitis B virus infection ([place])   . Cirrhosis ([place])   . COPD (chronic obstructive pulmonary disease) ([place])   . Dyspnea   . GERD (gastroesophageal reflux disease)   . GERD (gastroesophageal reflux disease)   . Hand pain   . Hepatitis   . Hypotension   . Lung nodules   . Osteoarthritis    Past Surgical History:  Procedure Laterality Date  . APPENDECTOMY    . BACK SURGERY     [phone]  . PROSTATECTOMY  2012  . THROAT SURGERY    . TONSILLECTOMY       Allergies: Penicillins; Other; Hydrocodone-acetaminophen; Codeine; and Oxycodone  Medications: Prior to Admission medications   Medication Sig Start Date End Date Taking? Authorizing Provider  abiraterone acetate (ZYTIGA) 250 MG tablet Take by mouth. 07/12/17  Yes [provider]  atorvastatin (LIPITOR) 10 MG tablet Take 1 tablet (10 mg total) by mouth daily. 07/26/17 10/24/17 Yes Imogene Burn, PA-C  dronabinol (MARINOL) 2.5 MG capsule Take 1 capsule (2.5 mg total) by mouth 2 (two) times daily before lunch and supper. 08/26/17  Yes Volanda Napoleon, MD  predniSONE (DELTASONE) 5 MG tablet TAKE 1 TABLET BY MOUTH DAILY. 07/12/17  Yes [provider]  prochlorperazine (COMPAZINE) 10 MG tablet Take 1 tablet by mouth every 6-8 hours as  needed for nausea 05/09/17  Yes [provider]  ranitidine (ZANTAC) 300 MG tablet Take by mouth. 06/01/17  Yes [provider]  AZOPT 1 % ophthalmic suspension INSTILL 1 DROP TWICE A DAY INTO LEFT EYE 08/11/17   [provider]  brimonidine (ALPHAGAN) 0.2 % ophthalmic solution INSTILL 1 DROP BY OPHTHALMIC ROUTE THREE TIMES DAILY 08/11/17   [provider]  Cholecalciferol (VITAMIN D-3) 1000 UNITS CAPS Take 1 capsule by mouth daily.    [provider]  COMBIGAN 0.2-0.5 % ophthalmic solution PLACE 1 DROP IN EACH EYE TWICE (2) DAILY 08/13/17   [provider]  dorzolamide-timolol (COSOPT) 22.3-6.8 MG/ML ophthalmic solution instill 1 DROP IN EACH EYE TWICE (2) DAILY 06/20/17   [provider]  DUREZOL 0.05 % EMUL PLACE 1 DROP IN THE RIGHT EYE FOUR (4) TIMES DAILY UNTIL TOLD OTHERWISE 08/09/17   [provider]  milk thistle 175 MG tablet Take 175 mg by mouth daily.    [provider]  ofloxacin (OCUFLOX) 0.3 % ophthalmic solution USE 1 DROP IN RIGHT EYE 3 TIMES A DAY 08/06/17   [provider]  OVER THE COUNTER MEDICATION Take 1 capsule by mouth 2 (two) times daily.     [provider]  varenicline (CHANTIX PAK) 0.5 MG X 11 & 1 MG X 42 tablet Take one 0.5 mg tab by mouth once daily for 3 days, increase to one 0.5 mg tab twice daily for 4 days, increase to 1  mg tab twice daily. 07/28/17 10/26/17  [provider]  vitamin B-12 (CYANOCOBALAMIN) 100 MCG tablet Take 100 mcg by mouth daily.    [provider]     Vital Signs: BP (!) 125/96 (BP Location: Right Arm)   Pulse 93   Temp 98.1 F (36.7 C) (Oral)   Resp 18   SpO2 99%   Physical Exam awake, alert.  Chest with distant breath sounds bilaterally.  Heart with regular rate and rhythm.  Abdomen soft, positive bowel sounds, nontender.  No significant lower extremity edema.  Imaging: No results found.  Labs:  CBC: Recent Labs     08/26/17 1521  WBC 10.1*  HCT 33.8*  PLT 361    COAGS: No results for input(s): INR, APTT in the last 8760 hours.  BMP: Recent Labs    08/26/17 1521  NA 140  K 4.3  CL 103  CO2 30  GLUCOSE 103  BUN 16  CALCIUM 9.1  CREATININE 1.10    LIVER FUNCTION TESTS: Recent Labs    08/26/17 1521  BILITOT 0.8  AST 15  ALT 12  ALKPHOS 109*  PROT 7.5  ALBUMIN 3.2*    Assessment and Plan: Pt with history of progressive castrate resistant metastatic prostate carcinoma ; presents  today for Port-A-Cath placement for palliative chemotherapy.Risks and benefits of image guided port-a-catheter placement was discussed with the patient/spouse including, but not limited to bleeding, infection, pneumothorax, or fibrin sheath development and need for additional procedures.  All of the patient's questions were answered, patient is agreeable to proceed. Consent signed and in chart.      Electronically Signed: D. Rowe Robert, PA-C 09/06/2017, 12:02 PM   I spent a total of 25 minutes at the the patient's bedside AND on the patient's hospital floor or unit, greater than 50% of which was counseling/coordinating care for port a cath placement

## 2017-09-06 NOTE — Procedures (Signed)
Placement of right jugular port.  Tip at SVC/RA junction.  Minimal blood loss and no immediate complication.  

## 2017-09-06 NOTE — Discharge Instructions (Signed)
Implanted Port Home Guide °An implanted port is a type of central line that is placed under the skin. Central lines are used to provide IV access when treatment or nutrition needs to be given through a person’s veins. Implanted ports are used for long-term IV access. An implanted port may be placed because: °· You need IV medicine that would be irritating to the small veins in your hands or arms. °· You need long-term IV medicines, such as antibiotics. °· You need IV nutrition for a long period. °· You need frequent blood draws for lab tests. °· You need dialysis. ° °Implanted ports are usually placed in the chest area, but they can also be placed in the upper arm, the abdomen, or the leg. An implanted port has two main parts: °· Reservoir. The reservoir is round and will appear as a small, raised area under your skin. The reservoir is the part where a needle is inserted to give medicines or draw blood. °· Catheter. The catheter is a thin, flexible tube that extends from the reservoir. The catheter is placed into a large vein. Medicine that is inserted into the reservoir goes into the catheter and then into the vein. ° °How will I care for my incision site? °Do not get the incision site wet. Bathe or shower as directed by your health care provider. °How is my port accessed? °Special steps must be taken to access the port: °· Before the port is accessed, a numbing cream can be placed on the skin. This helps numb the skin over the port site. °· Your health care provider uses a sterile technique to access the port. °? Your health care provider must put on a mask and sterile gloves. °? The skin over your port is cleaned carefully with an antiseptic and allowed to dry. °? The port is gently pinched between sterile gloves, and a needle is inserted into the port. °· Only "non-coring" port needles should be used to access the port. Once the port is accessed, a blood return should be checked. This helps ensure that the port  is in the vein and is not clogged. °· If your port needs to remain accessed for a constant infusion, a clear (transparent) bandage will be placed over the needle site. The bandage and needle will need to be changed every week, or as directed by your health care provider. °· Keep the bandage covering the needle clean and dry. Do not get it wet. Follow your health care provider’s instructions on how to take a shower or bath while the port is accessed. °· If your port does not need to stay accessed, no bandage is needed over the port. ° °What is flushing? °Flushing helps keep the port from getting clogged. Follow your health care provider’s instructions on how and when to flush the port. Ports are usually flushed with saline solution or a medicine called heparin. The need for flushing will depend on how the port is used. °· If the port is used for intermittent medicines or blood draws, the port will need to be flushed: °? After medicines have been given. °? After blood has been drawn. °? As part of routine maintenance. °· If a constant infusion is running, the port may not need to be flushed. ° °How long will my port stay implanted? °The port can stay in for as long as your health care provider thinks it is needed. When it is time for the port to come out, surgery will be   done to remove it. The procedure is similar to the one performed when the port was put in. °When should I seek immediate medical care? °When you have an implanted port, you should seek immediate medical care if: °· You notice a bad smell coming from the incision site. °· You have swelling, redness, or drainage at the incision site. °· You have more swelling or pain at the port site or the surrounding area. °· You have a fever that is not controlled with medicine. ° °This information is not intended to replace advice given to you by your health care provider. Make sure you discuss any questions you have with your health care provider. °Document  Released: 05/10/2005 Document Revised: 10/16/2015 Document Reviewed: 01/15/2013 °Elsevier Interactive Patient Education © 2017 [employer] °Moderate Conscious Sedation, Adult, Care After °These instructions provide you with information about caring for yourself after your procedure. Your health care provider may also give you more specific instructions. Your treatment has been planned according to current medical practices, but problems sometimes occur. Call your health care provider if you have any problems or questions after your procedure. °What can I expect after the procedure? °After your procedure, it is common: °· To feel sleepy for several hours. °· To feel clumsy and have poor balance for several hours. °· To have poor judgment for several hours. °· To vomit if you eat too soon. ° °Follow these instructions at home: °For at least 24 hours after the procedure: ° °· Do not: °? Participate in activities where you could fall or become injured. °? Drive. °? Use heavy machinery. °? Drink alcohol. °? Take sleeping pills or medicines that cause drowsiness. °? Make important decisions or sign legal documents. °? Take care of children on your own. °· Rest. °Eating and drinking °· Follow the diet recommended by your health care provider. °· If you vomit: °? Drink water, juice, or soup when you can drink without vomiting. °? Make sure you have little or no nausea before eating solid foods. °General instructions °· Have a responsible adult stay with you until you are awake and alert. °· Take over-the-counter and prescription medicines only as told by your health care provider. °· If you smoke, do not smoke without supervision. °· Keep all follow-up visits as told by your health care provider. This is important. °Contact a health care provider if: °· You keep feeling nauseous or you keep vomiting. °· You feel light-headed. °· You develop a rash. °· You have a fever. °Get help right away if: °· You have trouble  breathing. °This information is not intended to replace advice given to you by your health care provider. Make sure you discuss any questions you have with your health care provider. °Document Released: 02/28/2013 Document Revised: 10/13/2015 Document Reviewed: 08/30/2015 °Elsevier Interactive Patient Education © 2018 Elsevier Inc. ° °

## 2017-09-08 ENCOUNTER — Inpatient Hospital Stay: Payer: Medicare Other

## 2017-09-08 ENCOUNTER — Encounter (HOSPITAL_COMMUNITY): Payer: Self-pay

## 2017-09-08 ENCOUNTER — Telehealth: Payer: Self-pay | Admitting: Medical Oncology

## 2017-09-08 ENCOUNTER — Encounter (HOSPITAL_COMMUNITY)
Admission: RE | Admit: 2017-09-08 | Discharge: 2017-09-08 | Disposition: A | Discharge status: Home / Self Care | Payer: Medicare Other | Source: Ambulatory Visit | Attending: Hematology & Oncology | Admitting: Hematology & Oncology

## 2017-09-08 DIAGNOSIS — D500 Iron deficiency anemia secondary to blood loss (chronic): Secondary | ICD-10-CM | POA: Diagnosis present

## 2017-09-08 DIAGNOSIS — C7951 Secondary malignant neoplasm of bone: Secondary | ICD-10-CM | POA: Diagnosis present

## 2017-09-08 DIAGNOSIS — C61 Malignant neoplasm of prostate: Secondary | ICD-10-CM | POA: Diagnosis not present

## 2017-09-08 MED ORDER — LIDOCAINE-PRILOCAINE 2.5-2.5 % EX CREA: 1.0000 "application " | TOPICAL_CREAM | CUTANEOUS | 2 refills | Status: DC | PRN

## 2017-09-08 MED ORDER — AXUMIN (FLUCICLOVINE F 18) INJECTION
9.2600 | Freq: Once | INTRAVENOUS | Status: AC
  Administered 2017-09-08: 9.26 via INTRAVENOUS

## 2017-09-08 NOTE — Telephone Encounter (Signed)
Called in emla cream 

## 2017-09-08 NOTE — Progress Notes (Unsigned)
Spoke with Dr Marin Olp regarding when Patient to stop taking Zytiga.  Instructed patient to stop taking now.  Patient stated he took dose this am but will not take anymore.

## 2017-09-14 ENCOUNTER — Inpatient Hospital Stay: Payer: Medicare Other

## 2017-09-14 ENCOUNTER — Inpatient Hospital Stay (HOSPITAL_BASED_OUTPATIENT_CLINIC_OR_DEPARTMENT_OTHER): Payer: Medicare Other | Admitting: Family

## 2017-09-14 ENCOUNTER — Other Ambulatory Visit: Payer: Self-pay

## 2017-09-14 VITALS — BP 134/76 | HR 61 | Temp 98.3°F | Resp 18

## 2017-09-14 VITALS — BP 156/66 | HR 59 | Temp 97.8°F | Resp 17 | Wt 143.0 lb

## 2017-09-14 DIAGNOSIS — R530 Neoplastic (malignant) related fatigue: Secondary | ICD-10-CM | POA: Diagnosis not present

## 2017-09-14 DIAGNOSIS — D500 Iron deficiency anemia secondary to blood loss (chronic): Secondary | ICD-10-CM

## 2017-09-14 DIAGNOSIS — C61 Malignant neoplasm of prostate: Secondary | ICD-10-CM

## 2017-09-14 DIAGNOSIS — G893 Neoplasm related pain (acute) (chronic): Secondary | ICD-10-CM | POA: Diagnosis not present

## 2017-09-14 DIAGNOSIS — C7951 Secondary malignant neoplasm of bone: Principal | ICD-10-CM

## 2017-09-14 DIAGNOSIS — Z79899 Other long term (current) drug therapy: Secondary | ICD-10-CM

## 2017-09-14 DIAGNOSIS — F17210 Nicotine dependence, cigarettes, uncomplicated: Secondary | ICD-10-CM

## 2017-09-14 DIAGNOSIS — Z5189 Encounter for other specified aftercare: Secondary | ICD-10-CM | POA: Diagnosis not present

## 2017-09-14 LAB — CBC WITH DIFFERENTIAL (CANCER CENTER ONLY)
BASOS ABS: 0 10*3/uL (ref 0.0–0.1)
Basophils Relative: 1 %
EOS ABS: 0.4 10*3/uL (ref 0.0–0.5)
Eosinophils Relative: 7 %
HCT: 32 % — ABNORMAL LOW (ref 38.7–49.9)
Hemoglobin: 10.5 g/dL — ABNORMAL LOW (ref 13.0–17.1)
Lymphocytes Relative: 18 %
Lymphs Abs: 1.1 10*3/uL (ref 0.9–3.3)
MCH: 33.4 pg (ref 28.0–33.4)
MCHC: 32.8 g/dL (ref 32.0–35.9)
MCV: 101.9 fL — AB (ref 82.0–98.0)
MONO ABS: 0.6 10*3/uL (ref 0.1–0.9)
Monocytes Relative: 10 %
Neutro Abs: 3.8 10*3/uL (ref 1.5–6.5)
Neutrophils Relative %: 64 %
Platelet Count: 275 10*3/uL (ref 145–400)
RBC: 3.14 MIL/uL — AB (ref 4.20–5.70)
RDW: 15.5 % (ref 11.1–15.7)
WBC: 5.9 10*3/uL (ref 4.0–10.0)

## 2017-09-14 LAB — CMP (CANCER CENTER ONLY)
ALT: 16 U/L (ref 10–47)
ANION GAP: 4 — AB (ref 5–15)
AST: 22 U/L (ref 11–38)
Albumin: 3.1 g/dL — ABNORMAL LOW (ref 3.5–5.0)
Alkaline Phosphatase: 82 U/L (ref 26–84)
BUN: 15 mg/dL (ref 7–22)
CO2: 28 mmol/L (ref 18–33)
Calcium: 9.3 mg/dL (ref 8.0–10.3)
Chloride: 110 mmol/L — ABNORMAL HIGH (ref 98–108)
Creatinine: 1.2 mg/dL (ref 0.60–1.20)
Glucose, Bld: 110 mg/dL (ref 73–118)
POTASSIUM: 3.9 mmol/L (ref 3.3–4.7)
SODIUM: 142 mmol/L (ref 128–145)
TOTAL PROTEIN: 6.8 g/dL (ref 6.4–8.1)
Total Bilirubin: 0.7 mg/dL (ref 0.2–1.6)

## 2017-09-14 LAB — IRON AND TIBC
Iron: 64 ug/dL (ref 42–163)
SATURATION RATIOS: 24 % — AB (ref 42–163)
TIBC: 263 ug/dL (ref 202–409)
UIBC: 199 ug/dL

## 2017-09-14 LAB — FERRITIN: Ferritin: 620 ng/mL — ABNORMAL HIGH (ref 22–316)

## 2017-09-14 MED ORDER — DEXAMETHASONE 4 MG PO TABS: 8.0000 mg | ORAL_TABLET | Freq: Two times a day (BID) | ORAL | 1 refills | Status: DC

## 2017-09-14 MED ORDER — ONDANSETRON HCL 8 MG PO TABS
8.0000 mg | ORAL_TABLET | Freq: Two times a day (BID) | ORAL | 1 refills | Status: DC | PRN
Start: 2017-09-14 — End: 2017-12-07

## 2017-09-14 MED ORDER — PROCHLORPERAZINE MALEATE 10 MG PO TABS: 10.0000 mg | ORAL_TABLET | Freq: Four times a day (QID) | ORAL | 1 refills | Status: DC | PRN

## 2017-09-14 MED ORDER — LORAZEPAM 0.5 MG PO TABS: 0.5000 mg | ORAL_TABLET | Freq: Four times a day (QID) | ORAL | 0 refills | Status: DC | PRN

## 2017-09-14 MED ORDER — LIDOCAINE-PRILOCAINE 2.5-2.5 % EX CREA: TOPICAL_CREAM | CUTANEOUS | 3 refills | Status: DC

## 2017-09-14 MED ORDER — SODIUM CHLORIDE 0.9 % IV SOLN
63.7500 mg/m2 | Freq: Once | INTRAVENOUS | Status: AC
  Administered 2017-09-14: 110 mg via INTRAVENOUS
  Filled 2017-09-14: qty 11

## 2017-09-14 MED ORDER — SODIUM CHLORIDE 0.9% FLUSH
10.0000 mL | INTRAVENOUS | Status: DC | PRN
  Administered 2017-09-14: 10 mL
  Filled 2017-09-14: qty 10

## 2017-09-14 MED ORDER — HEPARIN SOD (PORK) LOCK FLUSH 100 UNIT/ML IV SOLN
500.0000 [IU] | Freq: Once | INTRAVENOUS | Status: AC | PRN
  Administered 2017-09-14: 500 [IU]
  Filled 2017-09-14: qty 5

## 2017-09-14 MED ORDER — DEXAMETHASONE SODIUM PHOSPHATE 10 MG/ML IJ SOLN
INTRAMUSCULAR | Status: AC
  Filled 2017-09-14: qty 1

## 2017-09-14 MED ORDER — DEXAMETHASONE SODIUM PHOSPHATE 10 MG/ML IJ SOLN
10.0000 mg | Freq: Once | INTRAMUSCULAR | Status: AC
  Administered 2017-09-14: 10 mg via INTRAVENOUS

## 2017-09-14 MED ORDER — DOCETAXEL CHEMO INJECTION 160 MG/16ML: 63.7500 mg/m2 | Freq: Once | INTRAVENOUS | Status: DC

## 2017-09-14 MED ORDER — ONDANSETRON HCL 8 MG PO TABS: 8.0000 mg | ORAL_TABLET | Freq: Two times a day (BID) | ORAL | 1 refills | Status: DC | PRN

## 2017-09-14 MED ORDER — SODIUM CHLORIDE 0.9 % IV SOLN
Freq: Once | INTRAVENOUS | Status: AC
  Administered 2017-09-14: 10:00:00 via INTRAVENOUS

## 2017-09-14 NOTE — Progress Notes (Signed)
Hematology and Oncology Follow Up Visit  HANSEL DEVAN [id number] [birth year] 72 y.o. 09/14/2017   Principle Diagnosis:  Metastatic castrate resistant prostate cancer - progressive   Current Therapy:   Taxotere every 3 weeks - started 09/14/2017   Interim History:  Mr. Saiki is here today with his wife for follow-up and start treatment with Taxotere.  His PET scan last week showed nodal metastasis within the mediastinal and right hilar region, pulmonary nodules within both lower lobes and left upper lobe as well as widespread sclerotic metastasis.  PSA 2 weeks ago was 140 and testosterone < 3. Today's results are pending.  He states that he is doing well and was able to mow his lawn yesterday. Since stopping the Zytiga, he has not had any more bone pain.  No fever, chills, cough, rash, dizziness, SOB, chest pain, palpitations, abdominal pain or changes in bowel or bladder habits.  He has chronic constipation and uses lactulose and dulcolax as needed.  He states that he has a hiatal hernia and severe GERD. He will occasionally has nausea and vomiting with this. His regimen of zantac and compazine is generally effective in managing his symptoms.  No episodes of bleeding, no petechiae. He does bruise easily but not in excess.  No lymphadenopathy noted on exam.  No swelling, tenderness or tingling in his extremities at this time. He has numbness in the right thigh associated with past back surgeries. This is unchanged.  He has a good appetite on Marinol and is staying well hydrated. His weight is up 2 lbs.   ECOG Performance Status: 1 - Symptomatic but completely ambulatory  Medications:  Allergies as of 09/14/2017      Reactions   Penicillins Hives   Other Nausea Only, Other (See Comments)   Uncoded Allergy. Allergen: IV contrast   Hydrocodone-acetaminophen Nausea And Vomiting   Codeine Nausea And Vomiting   Oxycodone Other (See Comments)   somnolence      Medication List        Accurate as of 09/14/17 10:48 AM. Always use your most recent med list.          AZOPT 1 % ophthalmic suspension Generic drug:  brinzolamide INSTILL 1 DROP TWICE A DAY INTO LEFT EYE   COMBIGAN 0.2-0.5 % ophthalmic solution Generic drug:  brimonidine-timolol PLACE 1 DROP IN EACH EYE TWICE (2) DAILY   dexamethasone 4 MG tablet Commonly known as:  DECADRON Take 2 tablets (8 mg total) by mouth 2 (two) times daily. Start the day before Taxotere. Then daily after chemo for 2 days.   dronabinol 2.5 MG capsule Commonly known as:  MARINOL Take 1 capsule (2.5 mg total) by mouth 2 (two) times daily before lunch and supper.   lidocaine-prilocaine cream Commonly known as:  EMLA Apply 1 application topically as needed. Apply to port 1-2 hours before coming for treatment.  1/2 tbsp applied but do not rub in.  Place plastic covering over cream.   lidocaine-prilocaine cream Commonly known as:  EMLA Apply to affected area once   LORazepam 0.5 MG tablet Commonly known as:  ATIVAN Take 1 tablet (0.5 mg total) by mouth every 6 (six) hours as needed (Nausea or vomiting).   milk thistle 175 MG tablet Take 175 mg by mouth daily.   ondansetron 8 MG tablet Commonly known as:  ZOFRAN Take 1 tablet (8 mg total) by mouth 2 (two) times daily as needed for refractory nausea / vomiting.   OVER THE COUNTER MEDICATION Take 1  capsule by mouth 2 (two) times daily.   prochlorperazine 10 MG tablet Commonly known as:  COMPAZINE Take 1 tablet by mouth every 6-8 hours as needed for nausea   prochlorperazine 10 MG tablet Commonly known as:  COMPAZINE Take 1 tablet (10 mg total) by mouth every 6 (six) hours as needed (Nausea or vomiting).   ranitidine 300 MG tablet Commonly known as:  ZANTAC Take by mouth.   vitamin B-12 100 MCG tablet Commonly known as:  CYANOCOBALAMIN Take 100 mcg by mouth daily.   Vitamin D-3 1000 units Caps Take 1 capsule by mouth daily.       Allergies:  Allergies    Allergen Reactions  . Penicillins Hives  . Other Nausea Only and Other (See Comments)    Uncoded Allergy. Allergen: IV contrast  . Hydrocodone-Acetaminophen Nausea And Vomiting  . Codeine Nausea And Vomiting  . Oxycodone Other (See Comments)    somnolence    Past Medical History, Surgical history, Social history, and Family History were reviewed and updated.  Review of Systems: All other 10 point review of systems is negative.   Physical Exam:  weight is 143 lb (64.9 kg). His oral temperature is 97.8 F (36.6 C). His blood pressure is 156/66 (abnormal) and his pulse is 59 (abnormal). His respiration is 17 and oxygen saturation is 98%.   Wt Readings from Last 3 Encounters:  09/14/17 143 lb (64.9 kg)  08/26/17 141 lb (64 kg)  07/26/17 140 lb (63.5 kg)    Ocular: Sclerae unicteric, pupils equal, round and reactive to light Ear-nose-throat: Oropharynx clear, dentition fair Lymphatic: No cervical, supraclavicular or axillary adenopathy Lungs no rales or rhonchi, good excursion bilaterally Heart regular rate and rhythm, no murmur appreciated Abd soft, nontender, positive bowel sounds, no liver or spleen tip palpated on exam, no fluid wave  MSK no focal spinal tenderness, no joint edema Neuro: non-focal, well-oriented, appropriate affect Breasts: Deferred   Lab Results  Component Value Date   WBC 5.9 09/14/2017   HGB 10.5 (L) 09/14/2017   HCT 32.0 (L) 09/14/2017   MCV 101.9 (H) 09/14/2017   PLT 275 09/14/2017   No results found for: FERRITIN, IRON, TIBC, UIBC, IRONPCTSAT Lab Results  Component Value Date   RETICCTPCT 1.1 01/20/2015   RBC 3.14 (L) 09/14/2017   RETICCTABS 50.2 01/20/2015   No results found for: KPAFRELGTCHN, LAMBDASER, KAPLAMBRATIO No results found for: IGGSERUM, IGA, IGMSERUM No results found for: Odetta Pink, SPEI   Chemistry      Component Value Date/Time   NA 142 09/14/2017 0848   NA 147 (H)  06/06/2015 1156   K 3.9 09/14/2017 0848   K 5.4 (H) 06/06/2015 1156   CL 110 (H) 09/14/2017 0848   CL 106 06/06/2015 1156   CO2 28 09/14/2017 0848   CO2 26 06/06/2015 1156   BUN 15 09/14/2017 0848   BUN 18 06/06/2015 1156   CREATININE 1.20 09/14/2017 0848   CREATININE 1.1 06/06/2015 1156      Component Value Date/Time   CALCIUM 9.3 09/14/2017 0848   CALCIUM 9.6 06/06/2015 1156   ALKPHOS 82 09/14/2017 0848   ALKPHOS 42 06/06/2015 1156   AST 22 09/14/2017 0848   ALT 16 09/14/2017 0848   ALT 13 06/06/2015 1156   BILITOT 0.7 09/14/2017 0848      Impression and Plan: Mr. Grunewald is a very pleasant 72 yo caucasian gentleman with progressive metastatic castrate resistant prostate cancer. He is doing well  and ready to start treatment today with Taxotere.  We will proceed with day 1 as planned.  PSA and testosterone level are pending.  Compazine and Lactulose were refilled today.  We will plan to see him back in another 3 weeks for follow-up and treatment.  They will contact our office or the on call service with any questions or concerns and go to the ED in the event of an emergency. We can certainly see him sooner if need be.   Laverna Peace, NP 4/24/201910:48 AM

## 2017-09-14 NOTE — Patient Instructions (Signed)
[facility] Discharge Instructions for Patients Receiving Chemotherapy  Today you received the following chemotherapy agents Taxotere.  To help prevent nausea and vomiting after your treatment, we encourage you to take your nausea medication as prescribed by your MD.   If you develop nausea and vomiting that is not controlled by your nausea medication, call the clinic.   BELOW ARE SYMPTOMS THAT SHOULD BE REPORTED IMMEDIATELY:  *FEVER GREATER THAN 100.5 F  *CHILLS WITH OR WITHOUT FEVER  NAUSEA AND VOMITING THAT IS NOT CONTROLLED WITH YOUR NAUSEA MEDICATION  *UNUSUAL SHORTNESS OF BREATH  *UNUSUAL BRUISING OR BLEEDING  TENDERNESS IN MOUTH AND THROAT WITH OR WITHOUT PRESENCE OF ULCERS  *URINARY PROBLEMS  *BOWEL PROBLEMS  UNUSUAL RASH Items with * indicate a potential emergency and should be followed up as soon as possible.  Feel free to call the clinic should you have any questions or concerns. The clinic phone number is [phone].  Please show the [facility] at check-in to the Emergency Department and triage nurse.

## 2017-09-15 ENCOUNTER — Telehealth: Payer: Self-pay | Admitting: *Deleted

## 2017-09-15 LAB — PROSTATE-SPECIFIC AG, SERUM (LABCORP): Prostate Specific Ag, Serum: 140.9 ng/mL — ABNORMAL HIGH (ref 0.0–4.0)

## 2017-09-15 LAB — TESTOSTERONE: Testosterone: <3 ng/dL — ABNORMAL LOW (ref 264–916)

## 2017-09-15 NOTE — Telephone Encounter (Signed)
Spoke to patient's wife due to patient being hard of hearing.  Patient is doing well today. He has no complaints. He hasn't had issues with any side effects. He has all his prescribed medications at home including prn medications for any symptoms that may develop.  Reviewed instructions for his medications. Confirmed the appointment for tomorrow.   They know to call the office with any problems or questions.

## 2017-09-16 ENCOUNTER — Inpatient Hospital Stay: Payer: Medicare Other

## 2017-09-16 VITALS — BP 124/63 | HR 52 | Temp 97.7°F | Resp 20

## 2017-09-16 DIAGNOSIS — Z5189 Encounter for other specified aftercare: Secondary | ICD-10-CM | POA: Diagnosis not present

## 2017-09-16 DIAGNOSIS — C61 Malignant neoplasm of prostate: Secondary | ICD-10-CM

## 2017-09-16 DIAGNOSIS — C7951 Secondary malignant neoplasm of bone: Principal | ICD-10-CM

## 2017-09-16 MED ORDER — PEGFILGRASTIM-CBQV 6 MG/0.6ML ~~LOC~~ SOSY
6.0000 mg | PREFILLED_SYRINGE | Freq: Once | SUBCUTANEOUS | Status: AC
  Administered 2017-09-16: 6 mg via SUBCUTANEOUS

## 2017-09-16 MED ORDER — PEGFILGRASTIM-CBQV 6 MG/0.6ML ~~LOC~~ SOSY
PREFILLED_SYRINGE | SUBCUTANEOUS | Status: AC
  Filled 2017-09-16: qty 0.6

## 2017-09-20 ENCOUNTER — Telehealth: Payer: Self-pay | Admitting: *Deleted

## 2017-09-20 NOTE — Telephone Encounter (Signed)
Patient's wife states that patient has a rash on his back that "looks like the measles". She'd like to know what to do.  Reviewed patient's recent treatment, and there are no clear connection to his medications. Wife denies any new contacts in the environment or time spent outdoors. The rash is not itchy or painful.   Recommended that the patient be seen by his PCP or Urgent Care. The wife stated that since it's not symptomatic, she would wait on further workup, but that if the rash spread or became uncomfortable, she would take him to the urgent care.

## 2017-09-22 ENCOUNTER — Other Ambulatory Visit: Payer: Self-pay

## 2017-09-22 ENCOUNTER — Emergency Department (HOSPITAL_COMMUNITY)
Admission: EM | Admit: 2017-09-22 | Discharge: 2017-09-22 | Disposition: A | Discharge status: Home / Self Care | Payer: Medicare Other | Attending: Emergency Medicine | Admitting: Emergency Medicine

## 2017-09-22 ENCOUNTER — Encounter (HOSPITAL_COMMUNITY): Payer: Self-pay | Admitting: *Deleted

## 2017-09-22 ENCOUNTER — Emergency Department (HOSPITAL_COMMUNITY): Payer: Medicare Other

## 2017-09-22 DIAGNOSIS — R0902 Hypoxemia: Secondary | ICD-10-CM | POA: Insufficient documentation

## 2017-09-22 DIAGNOSIS — J449 Chronic obstructive pulmonary disease, unspecified: Secondary | ICD-10-CM | POA: Diagnosis not present

## 2017-09-22 DIAGNOSIS — F17210 Nicotine dependence, cigarettes, uncomplicated: Secondary | ICD-10-CM | POA: Diagnosis not present

## 2017-09-22 DIAGNOSIS — Z79899 Other long term (current) drug therapy: Secondary | ICD-10-CM | POA: Diagnosis not present

## 2017-09-22 DIAGNOSIS — R21 Rash and other nonspecific skin eruption: Secondary | ICD-10-CM | POA: Insufficient documentation

## 2017-09-22 LAB — CBC WITH DIFFERENTIAL/PLATELET
BAND NEUTROPHILS: 0 %
BASOS ABS: 0 10*3/uL (ref 0.0–0.1)
BLASTS: 0 %
Basophils Relative: 0 %
EOS ABS: 0 10*3/uL (ref 0.0–0.7)
Eosinophils Relative: 0 %
HCT: 29 % — ABNORMAL LOW (ref 39.0–52.0)
HEMOGLOBIN: 9.4 g/dL — AB (ref 13.0–17.0)
Lymphocytes Relative: 8 %
Lymphs Abs: 2 10*3/uL (ref 0.7–4.0)
MCH: 32 pg (ref 26.0–34.0)
MCHC: 32.4 g/dL (ref 30.0–36.0)
MCV: 98.6 fL (ref 78.0–100.0)
METAMYELOCYTES PCT: 0 %
MONOS PCT: 7 %
Monocytes Absolute: 1.8 10*3/uL — ABNORMAL HIGH (ref 0.1–1.0)
Myelocytes: 0 %
NEUTROS ABS: 21.6 10*3/uL — AB (ref 1.7–7.7)
Neutrophils Relative %: 85 %
Other: 0 %
PLATELETS: 125 10*3/uL — AB (ref 150–400)
Promyelocytes Relative: 0 %
RBC: 2.94 MIL/uL — ABNORMAL LOW (ref 4.22–5.81)
RDW: 16.1 % — AB (ref 11.5–15.5)
WBC Morphology: INCREASED
WBC: 25.4 10*3/uL — ABNORMAL HIGH (ref 4.0–10.5)
nRBC: 0 /100 WBC

## 2017-09-22 LAB — COMPREHENSIVE METABOLIC PANEL
ALBUMIN: 3.1 g/dL — AB (ref 3.5–5.0)
ALK PHOS: 73 U/L (ref 38–126)
ALT: 10 U/L — AB (ref 17–63)
AST: 18 U/L (ref 15–41)
Anion gap: 8 (ref 5–15)
BILIRUBIN TOTAL: 0.6 mg/dL (ref 0.3–1.2)
BUN: 17 mg/dL (ref 6–20)
CALCIUM: 8.8 mg/dL — AB (ref 8.9–10.3)
CO2: 27 mmol/L (ref 22–32)
Chloride: 103 mmol/L (ref 101–111)
Creatinine, Ser: 1.13 mg/dL (ref 0.61–1.24)
GFR calc Af Amer: >60 mL/min (ref >60)
GFR calc non Af Amer: >60 mL/min (ref >60)
GLUCOSE: 107 mg/dL — AB (ref 65–99)
POTASSIUM: 4.2 mmol/L (ref 3.5–5.1)
Sodium: 138 mmol/L (ref 135–145)
TOTAL PROTEIN: 6.1 g/dL — AB (ref 6.5–8.1)

## 2017-09-22 LAB — I-STAT TROPONIN, ED: Troponin i, poc: 0.01 ng/mL (ref 0.00–0.08)

## 2017-09-22 MED ORDER — IOPAMIDOL (ISOVUE-370) INJECTION 76%
100.0000 mL | Freq: Once | INTRAVENOUS | Status: AC | PRN
  Administered 2017-09-22: 100 mL via INTRAVENOUS

## 2017-09-22 NOTE — ED Provider Notes (Signed)
Patient placed in Quick Look pathway, seen and evaluated   Chief Complaint: shortness of breath  HPI:   Alexander Ellis is a 72 y.o. male, presenting to the ED with shortness of breath on exertion for the last 2 days.  Patient underwent chemotherapy April 24.  Began to experience shortness of breath on exertion, fatigue, N/V, and subjective fever (T-max 100 F) yesterday.  Patient also endorses pruritic rash to the right wrist and bilateral upper back.  Patient went to be evaluated by his PCP today where they found his SPO2 to be 88%.  They contacted the patient's oncologist, Dr. Marin Olp, who advised coming to the ED to rule out PE.  Denies chest pain, dizziness, syncope, unilateral weakness.  ROS: Shortness of breath (one)  Physical Exam:   Gen: No distress  Neuro: Awake and Alert  Skin: Warm    Focused Exam:   No diaphoresis.  No pallor.  Pulmonary: No increased work of breathing.  Speaks in full sentences without difficulty.  Lung sounds clear.  No tachypnea.  Cardiac: Normal rate and regular. Peripheral pulses intact.  MSK: No peripheral edema.   Initiation of care has begun. The patient has been counseled on the process, plan, and necessity for staying for the completion/evaluation, and the remainder of the medical screening examination   Layla Maw 09/22/17 1813    Sherwood Gambler, MD 09/23/17 0006

## 2017-09-22 NOTE — ED Triage Notes (Signed)
Pt in stating he was at his PCP office for evaluation of a rash and his O2 level was reading 88%, pt denies feeling short of breath or any distress, states he was sent here to r/o blood clot, pt currently receiving chemo, last treatment was 8 days ago

## 2017-09-22 NOTE — ED Triage Notes (Signed)
Pt states in the last few days he has had exertional shortness of breath, states this is new for him, his chemo tx was a new treatment for him

## 2017-09-22 NOTE — ED Provider Notes (Signed)
[facility] EMERGENCY DEPARTMENT Provider Note   CSN: [id number] Arrival date & time: 09/22/17  1730     History   Chief Complaint Chief Complaint  Patient presents with  . Shortness of Breath    HPI Alexander Ellis is a 72 y.o. male.  HPI  72 year old male presents with hypoxia at his doctor's office.  He was there because he had a rash on his back and right arm for about 1 week.  While there and getting vital signs his O2 sat was noticed to be 88%.  His oncologist was consulted and he was told to come to the ER to get ruled out for PE.  Patient denies chest pain or dyspnea.  He has noticed a rash to his back that is mildly pruritic as well as a nonpruritic rash to his right forearm.  This rash has been present for about a week and not worsening or spreading.  His doctor told him it might be an allergic reaction from the medicine.  He has not had fever, cough, dyspnea, abdominal pain, dysuria.  No pain at his chest port site.  Last received chemotherapy 2 weeks ago and on 4/26 received pegfilgrastim-cbqv.  Past Medical History:  Diagnosis Date  . Anemia   . Aneurysm of abdominal aorta (HCC)   . Bone metastasis ([place])   . Cancer [facility]) 2014   prostate  . Chronic hepatitis B virus infection ([place])   . Cirrhosis ([place])   . COPD (chronic obstructive pulmonary disease) ([place])   . Dyspnea   . GERD (gastroesophageal reflux disease)   . GERD (gastroesophageal reflux disease)   . Hand pain   . Hepatitis   . Hypotension   . Lung nodules   . Osteoarthritis     Patient Active Problem List   Diagnosis Date Noted  . Carotid stenosis 07/26/2017  . 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 Hx of prostatectomy 07/16/2014  . Tobacco use disorder 07/16/2014  . Counseling regarding goals of care 07/16/2014  . Constipation 06/04/2014  . AAA (abdominal aortic aneurysm) ([place]) 10/12/2012  . Chronic hepatitis B ([place]) 10/12/2012      Past Surgical History:  Procedure Laterality Date  . APPENDECTOMY    . BACK SURGERY     [phone]  . IR FLUORO GUIDE PORT INSERTION RIGHT  09/06/2017  . IR US GUIDE VASC ACCESS RIGHT  09/06/2017  . PROSTATECTOMY  2012  . THROAT SURGERY    . TONSILLECTOMY          Home Medications    Prior to Admission medications   Medication Sig Start Date End Date Taking? Authorizing Provider  AZOPT 1 % ophthalmic suspension INSTILL 1 DROP TWICE A DAY INTO LEFT EYE 08/11/17   [provider]  Cholecalciferol (VITAMIN D-3) 1000 UNITS CAPS Take 1 capsule by mouth daily.    [provider]  COMBIGAN 0.2-0.5 % ophthalmic solution PLACE 1 DROP IN EACH EYE TWICE (2) DAILY 08/13/17   [provider]  dexamethasone (DECADRON) 4 MG tablet Take 2 tablets (8 mg total) by mouth 2 (two) times daily. Start the day before Taxotere. Then daily after chemo for 2 days. 09/14/17   Volanda Napoleon, MD  dronabinol (MARINOL) 2.5 MG capsule Take 1 capsule (2.5 mg total) by mouth 2 (two) times daily before lunch and supper. 08/26/17   Volanda Napoleon, MD  lidocaine-prilocaine (EMLA) cream Apply 1 application topically as needed.  Apply to port 1-2 hours before coming for treatment.  1/2 tbsp applied but do not rub in.  Place plastic covering over cream. 09/08/17   Volanda Napoleon, MD  lidocaine-prilocaine (EMLA) cream Apply to affected area once 09/14/17   Ennever, Rudell Cobb, MD  LORazepam (ATIVAN) 0.5 MG tablet Take 1 tablet (0.5 mg total) by mouth every 6 (six) hours as needed (Nausea or vomiting). 09/14/17   Volanda Napoleon, MD  milk thistle 175 MG tablet Take 175 mg by mouth daily.    [provider]  ondansetron (ZOFRAN) 8 MG tablet Take 1 tablet (8 mg total) by mouth 2 (two) times daily as needed for refractory nausea / vomiting. 09/14/17   Volanda Napoleon, MD  OVER THE COUNTER MEDICATION Take 1 capsule by mouth 2 (two) times daily.     [provider]   prochlorperazine (COMPAZINE) 10 MG tablet Take 1 tablet by mouth every 6-8 hours as needed for nausea 05/09/17   [provider]  prochlorperazine (COMPAZINE) 10 MG tablet Take 1 tablet (10 mg total) by mouth every 6 (six) hours as needed (Nausea or vomiting). 09/14/17   Volanda Napoleon, MD  ranitidine (ZANTAC) 300 MG tablet Take by mouth. 06/01/17   [provider]  vitamin B-12 (CYANOCOBALAMIN) 100 MCG tablet Take 100 mcg by mouth daily.    [provider]    Family History Family History  Problem Relation Age of Onset  . Heart disease Mother   . Hypertension Mother     Social History Social History   Tobacco Use  . Smoking status: Current Every Day Smoker    Packs/day: 0.50    Years: 60.00    Pack years: 30.00    Types: Cigarettes  . Smokeless tobacco: Never Used  . Tobacco comment: pack a day  Substance Use Topics  . Alcohol use: No    Alcohol/week: 0.0 oz  . Drug use: No     Allergies   Penicillins; Other; Hydrocodone-acetaminophen; Codeine; and Oxycodone   Review of Systems Review of Systems  Constitutional: Negative for fever.  Respiratory: Negative for cough and shortness of breath.   Cardiovascular: Negative for chest pain.  Gastrointestinal: Negative for abdominal pain and vomiting.  Genitourinary: Negative for dysuria.  Skin: Positive for rash.  Neurological: Negative for headaches.  All other systems reviewed and are negative.    Physical Exam Updated Vital Signs BP 123/70   Pulse 68   Temp 98.1 F (36.7 C) (Oral)   Resp 17   SpO2 97%   Physical Exam  Constitutional: He is oriented to person, place, and time. He appears well-developed and well-nourished.  Non-toxic appearance. He does not appear ill. No distress.  HENT:  Head: Normocephalic and atraumatic.  Right Ear: External ear normal.  Left Ear: External ear normal.  Nose: Nose normal.  Eyes: Right eye exhibits no discharge. Left eye exhibits no discharge.   Neck: Neck supple.  Cardiovascular: Normal rate, regular rhythm and normal heart sounds.  Pulmonary/Chest: Effort normal. He has rales in the right lower field and the left lower field.  Abdominal: Soft. There is no tenderness.  Musculoskeletal: He exhibits no edema.  Neurological: He is alert and oriented to person, place, and time.  Skin: Skin is warm and dry. Rash noted.  See pictures. Fine, mildly raised rash to back diffusely. Right forearm rash is not raised or tender  Nursing note and vitals reviewed.        ED Treatments /  Results  Labs (all labs ordered are listed, but only abnormal results are displayed) Labs Reviewed  COMPREHENSIVE METABOLIC PANEL - Abnormal; Notable for the following components:      Result Value   Glucose, Bld 107 (*)    Calcium 8.8 (*)    Total Protein 6.1 (*)    Albumin 3.1 (*)    ALT 10 (*)    All other components within normal limits  CBC WITH DIFFERENTIAL/PLATELET - Abnormal; Notable for the following components:   WBC 25.4 (*)    RBC 2.94 (*)    Hemoglobin 9.4 (*)    HCT 29.0 (*)    RDW 16.1 (*)    Platelets 125 (*)    Neutro Abs 21.6 (*)    Monocytes Absolute 1.8 (*)    All other components within normal limits  I-STAT TROPONIN, ED    EKG EKG Interpretation  Date/Time:  Thursday Sep 22 2017 17:50:56 EDT Ventricular Rate:  74 PR Interval:    QRS Duration: 88 QT Interval:  398 QTC Calculation: 441 R Axis:   56 Text Interpretation:  Normal sinus rhythm no acute ST/T changes Poor data quality Confirmed by Sherwood Gambler [phone]) on 09/22/2017 8:01:42 PM   Radiology Dg Chest 2 View  Result Date: 09/22/2017 CLINICAL DATA:  Shortness of breath.  Prostate cancer. EXAM: CHEST - 2 VIEW COMPARISON:  PET-CT 09/08/2017 FINDINGS: Underlying chronic interstitial lung disease again noted. Pulmonary nodule seen on previous PET-CT not readily evident by x-ray. No dense focal airspace consolidation. No pleural effusion. The cardiopericardial  silhouette is within normal limits for size. Right Port-A-Cath tip overlies the mid SVC. The visualized bony structures of the thorax are intact. IMPRESSION: Underlying chronic interstitial lung disease. Pulmonary nodules and lymphadenopathy seen on recent PET-CT not evident by x-ray. Electronically Signed   By: Misty Stanley M.D.   On: 09/22/2017 18:33   Ct Angio Chest Pe W And/or Wo Contrast  Result Date: 09/22/2017 CLINICAL DATA:  Shortness of breath. EXAM: CT ANGIOGRAPHY CHEST WITH CONTRAST TECHNIQUE: Multidetector CT imaging of the chest was performed using the standard protocol during bolus administration of intravenous contrast. Multiplanar CT image reconstructions and MIPs were obtained to evaluate the vascular anatomy. CONTRAST:  133mL ISOVUE-370 IOPAMIDOL (ISOVUE-370) INJECTION 76% COMPARISON:  PET-CT 09/08/2017. FINDINGS: Cardiovascular: Heart is enlarged. No pericardial effusion. Coronary artery calcification is evident. Atherosclerotic calcification is noted in the wall of the thoracic aorta. Right main pulmonary artery measures 2.9 cm diameter. Left main pulmonary artery measures 3.1 cm diameter. No filling defect within the opacified pulmonary arteries to suggest the presence of an acute pulmonary embolus. The tip of the right-sided Port-A-Cath is positioned in the distal SVC. Mediastinum/Nodes: 14 mm short axis right paratracheal lymph node stable since prior PET-CT. 10 mm short axis AP window lymph node also stable. Other scattered small mediastinal lymph nodes are similar. 13 mm short axis inferior right hilar lymph node better visualized on today's study with intravenous contrast material. The esophagus has normal imaging features. There is no axillary lymphadenopathy. Lungs/Pleura: Centrilobular emphysema noted. Subpleural reticulation noted in the lower lungs bilaterally. 9 mm left upper lobe pulmonary nodule measured on prior PET-CT is 10 mm on the current study (image 41/series 8). 19 mm  posterior right lower lobe nodule (115/8) is not substantially changed. 13 mm left lower lobe nodule (82/8) is stable. Upper Abdomen: Stable 15 mm left adrenal nodule seen to be hypermetabolic on previous PET-CT. Smaller right adrenal nodule is similar. Probable cyst upper  pole left kidney with incompletely visualized low-density lesion in the right kidney likely a cyst. Calcified gallstones evident. Musculoskeletal: Widespread sclerotic bony metastases evident. Review of the MIP images confirms the above findings. IMPRESSION: 1. No CT evidence for acute pulmonary embolus. 2. Prominence of the main pulmonary arteries suggests pulmonary arterial hypertension. 3. Underlying subpleural reticulation in the lungs with a lower lung predominance. Imaging features suggest component of fibrotic lung disease. 4. Stable appearance of mediastinal lymphadenopathy, bilateral pulmonary nodules, and left adrenal nodule. 5. Widespread sclerotic bony metastatic disease. Electronically Signed   By: Misty Stanley M.D.   On: 09/22/2017 19:37    Procedures Procedures (including critical care time)  Medications Ordered in ED Medications  iopamidol (ISOVUE-370) 76 % injection 100 mL (100 mLs Intravenous Contrast Given 09/22/17 1915)     Initial Impression / Assessment and Plan / ED Course  I have reviewed the triage vital signs and the nursing notes.  Pertinent labs & imaging results that were available during my care of the patient were reviewed by me and considered in my medical decision making (see chart for details).     Patient feels well.  His O2 sats have been above 90% while in the ED.  Unclear why his oxygen saturation was 88% at the office but it might of been a misinterpretation or poor waveform.  Given no dyspnea, hypoxia, and negative CT scan, I think he is cleared from a shortness of breath/pulmonary standpoint.  CT scan does show some fibrotic changes was likely explains the mild rales heard on exam.   Otherwise his vitals are benign.  He has no significant pain or tenderness.  Rash is nonspecific and of unclear etiology.  Does not appear consistent with petechia or purpura.  He has no meningeal signs or concern for an acute infection.  It is not spreading.  Given it somewhat itchy I did advise some low-dose cortisone cream and Benadryl as needed.  His white blood cell count is elevated and I discussed with oncology, Dr. Lindi Adie and given recent infusion the elevated WBC is less concerning.  I have advised him to watch for infectious symptoms but otherwise he appears stable for discharge home.  Follow-up with PCP.  Return precautions.  Final Clinical Impressions(s) / ED Diagnoses   Final diagnoses:  Rash and nonspecific skin eruption    ED Discharge Orders    None       Sherwood Gambler, MD 09/22/17 2056

## 2017-09-23 LAB — PATHOLOGIST SMEAR REVIEW

## 2017-10-03 ENCOUNTER — Telehealth: Payer: Self-pay | Admitting: *Deleted

## 2017-10-03 NOTE — Telephone Encounter (Signed)
Patients wife called stating that patient went to his followup appt with his eye doctor in regards to past cataract surgery.  Dr. Myrtie Neither on exam that "patient had bleeding in his eyes"  Wife states that to look at patient one could not see bleeding however with instruments, apparently MD could see this.  Dr. Marin Olp notified.  Stated that as long as patient is not outwardly bleeding patient is fine to wait to come in on Wednesday for regularly scheduled appointment.  No bleeding noted anywhere by wife.

## 2017-10-04 ENCOUNTER — Telehealth: Payer: Self-pay | Admitting: Family

## 2017-10-04 NOTE — Telephone Encounter (Signed)
Faxed medical record to [facility] @ [phone]. Release VF#[id number]

## 2017-10-05 ENCOUNTER — Inpatient Hospital Stay: Payer: Medicare Other

## 2017-10-05 ENCOUNTER — Telehealth: Payer: Self-pay | Admitting: Hematology & Oncology

## 2017-10-05 ENCOUNTER — Other Ambulatory Visit: Payer: Self-pay

## 2017-10-05 ENCOUNTER — Encounter: Payer: Self-pay | Admitting: Hematology & Oncology

## 2017-10-05 ENCOUNTER — Inpatient Hospital Stay: Payer: Medicare Other | Attending: Hematology & Oncology | Admitting: Hematology & Oncology

## 2017-10-05 VITALS — BP 136/63 | HR 58 | Temp 97.4°F | Resp 18 | Wt 143.0 lb

## 2017-10-05 DIAGNOSIS — C61 Malignant neoplasm of prostate: Secondary | ICD-10-CM | POA: Diagnosis present

## 2017-10-05 DIAGNOSIS — Z79899 Other long term (current) drug therapy: Secondary | ICD-10-CM | POA: Diagnosis not present

## 2017-10-05 DIAGNOSIS — C7951 Secondary malignant neoplasm of bone: Principal | ICD-10-CM

## 2017-10-05 DIAGNOSIS — H538 Other visual disturbances: Secondary | ICD-10-CM | POA: Diagnosis not present

## 2017-10-05 DIAGNOSIS — Z5111 Encounter for antineoplastic chemotherapy: Secondary | ICD-10-CM | POA: Diagnosis present

## 2017-10-05 DIAGNOSIS — M549 Dorsalgia, unspecified: Secondary | ICD-10-CM

## 2017-10-05 DIAGNOSIS — Z5189 Encounter for other specified aftercare: Secondary | ICD-10-CM | POA: Diagnosis not present

## 2017-10-05 DIAGNOSIS — D500 Iron deficiency anemia secondary to blood loss (chronic): Secondary | ICD-10-CM

## 2017-10-05 LAB — CMP (CANCER CENTER ONLY)
ALBUMIN: 3.2 g/dL — AB (ref 3.5–5.0)
ALT: 14 U/L (ref 10–47)
AST: 19 U/L (ref 11–38)
Alkaline Phosphatase: 86 U/L — ABNORMAL HIGH (ref 26–84)
Anion gap: 10 (ref 5–15)
BUN: 19 mg/dL (ref 7–22)
CHLORIDE: 105 mmol/L (ref 98–108)
CO2: 23 mmol/L (ref 18–33)
Calcium: 9 mg/dL (ref 8.0–10.3)
Creatinine: 1 mg/dL (ref 0.60–1.20)
GLUCOSE: 171 mg/dL — AB (ref 73–118)
POTASSIUM: 3.6 mmol/L (ref 3.3–4.7)
Sodium: 138 mmol/L (ref 128–145)
Total Bilirubin: 0.7 mg/dL (ref 0.2–1.6)
Total Protein: 6.6 g/dL (ref 6.4–8.1)

## 2017-10-05 LAB — IRON AND TIBC
Iron: 86 ug/dL (ref 42–163)
Saturation Ratios: 34 % — ABNORMAL LOW (ref 42–163)
TIBC: 256 ug/dL (ref 202–409)
UIBC: 170 ug/dL

## 2017-10-05 LAB — CBC WITH DIFFERENTIAL (CANCER CENTER ONLY)
BASOS ABS: 0 10*3/uL (ref 0.0–0.1)
Basophils Relative: 0 %
Eosinophils Absolute: 0 10*3/uL (ref 0.0–0.5)
Eosinophils Relative: 0 %
HEMATOCRIT: 28.2 % — AB (ref 38.7–49.9)
HEMOGLOBIN: 9.3 g/dL — AB (ref 13.0–17.1)
LYMPHS PCT: 6 %
Lymphs Abs: 0.6 10*3/uL — ABNORMAL LOW (ref 0.9–3.3)
MCH: 33.3 pg (ref 28.0–33.4)
MCHC: 33 g/dL (ref 32.0–35.9)
MCV: 101.1 fL — AB (ref 82.0–98.0)
MONO ABS: 0.6 10*3/uL (ref 0.1–0.9)
Monocytes Relative: 6 %
NEUTROS PCT: 88 %
Neutro Abs: 9.3 10*3/uL — ABNORMAL HIGH (ref 1.5–6.5)
Platelet Count: 258 10*3/uL (ref 145–400)
RBC: 2.79 MIL/uL — ABNORMAL LOW (ref 4.20–5.70)
RDW: 16.5 % — AB (ref 11.1–15.7)
WBC Count: 10.6 10*3/uL — ABNORMAL HIGH (ref 4.0–10.0)

## 2017-10-05 LAB — FERRITIN: FERRITIN: 1015 ng/mL — AB (ref 22–316)

## 2017-10-05 MED ORDER — DEXAMETHASONE SODIUM PHOSPHATE 10 MG/ML IJ SOLN
INTRAMUSCULAR | Status: AC
  Filled 2017-10-05: qty 1

## 2017-10-05 MED ORDER — SODIUM CHLORIDE 0.9 % IV SOLN
63.7500 mg/m2 | Freq: Once | INTRAVENOUS | Status: AC
  Administered 2017-10-05: 110 mg via INTRAVENOUS
  Filled 2017-10-05: qty 11

## 2017-10-05 MED ORDER — HEPARIN SOD (PORK) LOCK FLUSH 100 UNIT/ML IV SOLN
500.0000 [IU] | Freq: Once | INTRAVENOUS | Status: AC | PRN
  Administered 2017-10-05: 500 [IU]
  Filled 2017-10-05: qty 5

## 2017-10-05 MED ORDER — ALTEPLASE 2 MG IJ SOLR
2.0000 mg | Freq: Once | INTRAMUSCULAR | Status: DC | PRN
  Filled 2017-10-05: qty 2

## 2017-10-05 MED ORDER — HEPARIN SOD (PORK) LOCK FLUSH 100 UNIT/ML IV SOLN
250.0000 [IU] | Freq: Once | INTRAVENOUS | Status: DC | PRN
  Filled 2017-10-05: qty 5

## 2017-10-05 MED ORDER — DEXAMETHASONE SODIUM PHOSPHATE 10 MG/ML IJ SOLN
10.0000 mg | Freq: Once | INTRAMUSCULAR | Status: AC
Start: 2017-10-05 — End: 2017-10-05
  Administered 2017-10-05: 10 mg via INTRAVENOUS

## 2017-10-05 MED ORDER — SODIUM CHLORIDE 0.9 % IV SOLN
Freq: Once | INTRAVENOUS | Status: AC
Start: 2017-10-05 — End: 2017-10-05
  Administered 2017-10-05: 12:00:00 via INTRAVENOUS

## 2017-10-05 MED ORDER — SODIUM CHLORIDE 0.9% FLUSH
3.0000 mL | INTRAVENOUS | Status: DC | PRN
  Filled 2017-10-05: qty 10

## 2017-10-05 MED ORDER — SODIUM CHLORIDE 0.9% FLUSH
10.0000 mL | INTRAVENOUS | Status: DC | PRN
  Administered 2017-10-05: 10 mL
  Filled 2017-10-05: qty 10

## 2017-10-05 MED ORDER — PROCHLORPERAZINE MALEATE 10 MG PO TABS: 10.0000 mg | ORAL_TABLET | Freq: Four times a day (QID) | ORAL | 2 refills | Status: DC | PRN

## 2017-10-05 NOTE — Patient Instructions (Signed)
Docetaxel injection What is this medicine? DOCETAXEL (doe se TAX el) is a chemotherapy drug. It targets fast dividing cells, like cancer cells, and causes these cells to die. This medicine is used to treat many types of cancers like breast cancer, certain stomach cancers, head and neck cancer, lung cancer, and prostate cancer. This medicine may be used for other purposes; ask your health care provider or pharmacist if you have questions. COMMON BRAND NAME(S): Docefrez, Taxotere What should I tell my health care provider before I take this medicine? They need to know if you have any of these conditions: -infection (especially a virus infection such as chickenpox, cold sores, or herpes) -liver disease -low blood counts, like low white cell, platelet, or red cell counts -an unusual or allergic reaction to docetaxel, polysorbate 80, other chemotherapy agents, other medicines, foods, dyes, or preservatives -pregnant or trying to get pregnant -breast-feeding How should I use this medicine? This drug is given as an infusion into a vein. It is administered in a hospital or clinic by a specially trained health care professional. Talk to your pediatrician regarding the use of this medicine in children. Special care may be needed. Overdosage: If you think you have taken too much of this medicine contact a poison control center or emergency room at once. NOTE: This medicine is only for you. Do not share this medicine with others. What if I miss a dose? It is important not to miss your dose. Call your doctor or health care professional if you are unable to keep an appointment. What may interact with this medicine? -cyclosporine -erythromycin -ketoconazole -medicines to increase blood counts like filgrastim, pegfilgrastim, sargramostim -vaccines Talk to your doctor or health care professional before taking any of these medicines: -acetaminophen -aspirin -ibuprofen -ketoprofen -naproxen This list  may not describe all possible interactions. Give your health care provider a list of all the medicines, herbs, non-prescription drugs, or dietary supplements you use. Also tell them if you smoke, drink alcohol, or use illegal drugs. Some items may interact with your medicine. What should I watch for while using this medicine? Your condition will be monitored carefully while you are receiving this medicine. You will need important blood work done while you are taking this medicine. This drug may make you feel generally unwell. This is not uncommon, as chemotherapy can affect healthy cells as well as cancer cells. Report any side effects. Continue your course of treatment even though you feel ill unless your doctor tells you to stop. In some cases, you may be given additional medicines to help with side effects. Follow all directions for their use. Call your doctor or health care professional for advice if you get a fever, chills or sore throat, or other symptoms of a cold or flu. Do not treat yourself. This drug decreases your body's ability to fight infections. Try to avoid being around people who are sick. This medicine may increase your risk to bruise or bleed. Call your doctor or health care professional if you notice any unusual bleeding. This medicine may contain alcohol in the product. You may get drowsy or dizzy. Do not drive, use machinery, or do anything that needs mental alertness until you know how this medicine affects you. Do not stand or sit up quickly, especially if you are an older patient. This reduces the risk of dizzy or fainting spells. Avoid alcoholic drinks. Do not become pregnant while taking this medicine. Women should inform their doctor if they wish to become pregnant or   think they might be pregnant. There is a potential for serious side effects to an unborn child. Talk to your health care professional or pharmacist for more information. Do not breast-feed an infant while taking  this medicine. What side effects may I notice from receiving this medicine? Side effects that you should report to your doctor or health care professional as soon as possible: -allergic reactions like skin rash, itching or hives, swelling of the face, lips, or tongue -low blood counts - This drug may decrease the number of white blood cells, red blood cells and platelets. You may be at increased risk for infections and bleeding. -signs of infection - fever or chills, cough, sore throat, pain or difficulty passing urine -signs of decreased platelets or bleeding - bruising, pinpoint red spots on the skin, black, tarry stools, nosebleeds -signs of decreased red blood cells - unusually weak or tired, fainting spells, lightheadedness -breathing problems -fast or irregular heartbeat -low blood pressure -mouth sores -nausea and vomiting -pain, swelling, redness or irritation at the injection site -pain, tingling, numbness in the hands or feet -swelling of the ankle, feet, hands -weight gain Side effects that usually do not require medical attention (report to your doctor or health care professional if they continue or are bothersome): -bone pain -complete hair loss including hair on your head, underarms, pubic hair, eyebrows, and eyelashes -diarrhea -excessive tearing -changes in the color of fingernails -loosening of the fingernails -nausea -muscle pain -red flush to skin -sweating -weak or tired This list may not describe all possible side effects. Call your doctor for medical advice about side effects. You may report side effects to FDA at 1-800-FDA-1088. Where should I keep my medicine? This drug is given in a hospital or clinic and will not be stored at home. NOTE: This sheet is a summary. It may not cover all possible information. If you have questions about this medicine, talk to your doctor, pharmacist, or health care provider.  2018 Elsevier/Gold Standard (2015-06-12 12:32:56)  

## 2017-10-05 NOTE — Progress Notes (Signed)
Hematology and Oncology Follow Up Visit  BOWYN MERCIER [id number] 04-27-1946 72 y.o. 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 Regnier is here today with his wife for follow-up.  He is doing pretty well.  He really has had no complaints although there was some ocular issues that he had earlier this week.  Apparently, he saw his ophthalmologist.  The ophthalmologist looked into the fundus.  The ophthalmologist said that there was bleeding and it was because his platelets were too low.  I am not sure how he made this suggestion or the termination.  He never had blood work done to see what his platelet count was.  I find it hard to believe that he had low platelets that would cause any kind of fundic bleeding.  Mr. Pasha says that he has been taking Advil.  I told him to stop taking Advil.  He says his vision is getting a little bit better.  He has had no rashes.  He has had no cough.  He has had no mouth sores.  He has had no headache.  His appetite might be a little bit better.  Currently, his performance status is ECOG 2.    Medications:  Allergies as of 10/05/2017      Reactions   Oxycodone Other (See Comments)   somnolence Sleep all the time   Penicillins Hives, Rash   Codeine Nausea And Vomiting   Other Nausea Only, Other (See Comments)   Uncoded Allergy. Allergen: IV contrast   Hydrocodone-acetaminophen Nausea And Vomiting      Medication List        Accurate as of 10/05/17 11:30 AM. Always use your most recent med list.          AZOPT 1 % ophthalmic suspension Generic drug:  brinzolamide INSTILL 1 DROP TWICE A DAY INTO LEFT EYE   COMBIGAN 0.2-0.5 % ophthalmic solution Generic drug:  brimonidine-timolol PLACE 1 DROP IN EACH EYE TWICE (2) DAILY   dexamethasone 4 MG tablet Commonly known as:  DECADRON Take 2 tablets (8 mg total) by  mouth 2 (two) times daily. Start the day before Taxotere. Then daily after chemo for 2 days.   dronabinol 2.5 MG capsule Commonly known as:  MARINOL Take 1 capsule (2.5 mg total) by mouth 2 (two) times daily before lunch and supper.   lidocaine-prilocaine cream Commonly known as:  EMLA Apply 1 application topically as needed. Apply to port 1-2 hours before coming for treatment.  1/2 tbsp applied but do not rub in.  Place plastic covering over cream.   lidocaine-prilocaine cream Commonly known as:  EMLA Apply to affected area once   LORazepam 0.5 MG tablet Commonly known as:  ATIVAN Take 1 tablet (0.5 mg total) by mouth every 6 (six) hours as needed (Nausea or vomiting).   milk thistle 175 MG tablet Take 175 mg by mouth daily.   ondansetron 8 MG tablet Commonly known as:  ZOFRAN Take 1 tablet (8 mg total) by mouth 2 (two) times daily as needed for refractory nausea / vomiting.   OVER THE COUNTER MEDICATION Take 1 capsule by mouth 2 (two) times daily.   prochlorperazine 10 MG tablet Commonly known as:  COMPAZINE Take 1 tablet by mouth every 6-8 hours as needed for nausea   prochlorperazine 10 MG tablet Commonly known as:  COMPAZINE Take  1 tablet (10 mg total) by mouth every 6 (six) hours as needed (Nausea or vomiting).   ranitidine 300 MG tablet Commonly known as:  ZANTAC Take by mouth.   vitamin B-12 100 MCG tablet Commonly known as:  CYANOCOBALAMIN Take 100 mcg by mouth daily.   Vitamin D-3 1000 units Caps Take 1 capsule by mouth daily.       Allergies:  Allergies  Allergen Reactions  . Oxycodone Other (See Comments)    somnolence Sleep all the time  . Penicillins Hives and Rash  . Codeine Nausea And Vomiting  . Other Nausea Only and Other (See Comments)    Uncoded Allergy. Allergen: IV contrast  . Hydrocodone-Acetaminophen Nausea And Vomiting    Past Medical History, Surgical history, Social history, and Family History were reviewed and  updated.  Review of Systems: Review of Systems  Constitutional: Negative.   HENT: Negative.   Eyes: Positive for blurred vision.  Respiratory: Negative.   Cardiovascular: Negative.   Gastrointestinal: Negative.   Genitourinary: Negative.   Musculoskeletal: Positive for back pain.  Skin: Negative.   Neurological: Negative.   Endo/Heme/Allergies: Negative.   Psychiatric/Behavioral: Negative.      Physical Exam:  weight is 143 lb (64.9 kg). His oral temperature is 97.4 F (36.3 C) (abnormal). His blood pressure is 136/63 and his pulse is 58 (abnormal). His respiration is 18 and oxygen saturation is 100%.   Wt Readings from Last 3 Encounters:  10/05/17 143 lb (64.9 kg)  09/14/17 143 lb (64.9 kg)  08/26/17 141 lb (64 kg)    Physical Exam  Constitutional: He is oriented to person, place, and time.  HENT:  Head: Normocephalic and atraumatic.  Mouth/Throat: Oropharynx is clear and moist.  Eyes: Pupils are equal, round, and reactive to light. EOM are normal.  Neck: Normal range of motion.  Cardiovascular: Normal rate, regular rhythm and normal heart sounds.  Pulmonary/Chest: Effort normal and breath sounds normal.  Abdominal: Soft. Bowel sounds are normal.  Musculoskeletal: Normal range of motion. He exhibits no edema, tenderness or deformity.  Lymphadenopathy:    He has no cervical adenopathy.  Neurological: He is alert and oriented to person, place, and time.  Skin: Skin is warm and dry. No rash noted. No erythema.  Psychiatric: He has a normal mood and affect. His behavior is normal. Judgment and thought content normal.  Vitals reviewed.     Lab Results  Component Value Date   WBC 10.6 (H) 10/05/2017   HGB 9.3 (L) 10/05/2017   HCT 28.2 (L) 10/05/2017   MCV 101.1 (H) 10/05/2017   PLT 258 10/05/2017   Lab Results  Component Value Date   FERRITIN 620 (H) 09/14/2017   IRON 64 09/14/2017   TIBC 263 09/14/2017   UIBC 199 09/14/2017   IRONPCTSAT 24 (L) 09/14/2017    Lab Results  Component Value Date   RETICCTPCT 1.1 01/20/2015   RBC 2.79 (L) 10/05/2017   RETICCTABS 50.2 01/20/2015   No results found for: KPAFRELGTCHN, LAMBDASER, KAPLAMBRATIO No results found for: IGGSERUM, IGA, IGMSERUM No results found for: TOTALPROTELP, ALBUMINELP, A1GS, A2GS, Violet Baldy, MSPIKE, SPEI   Chemistry      Component Value Date/Time   NA 138 10/05/2017 1010   NA 147 (H) 06/06/2015 1156   K 3.6 10/05/2017 1010   K 5.4 (H) 06/06/2015 1156   CL 105 10/05/2017 1010   CL 106 06/06/2015 1156   CO2 23 10/05/2017 1010   CO2 26 06/06/2015 1156   BUN  19 10/05/2017 1010   BUN 18 06/06/2015 1156   CREATININE 1.00 10/05/2017 1010   CREATININE 1.1 06/06/2015 1156      Component Value Date/Time   CALCIUM 9.0 10/05/2017 1010   CALCIUM 9.6 06/06/2015 1156   ALKPHOS 86 (H) 10/05/2017 1010   ALKPHOS 42 06/06/2015 1156   AST 19 10/05/2017 1010   ALT 14 10/05/2017 1010   ALT 13 06/06/2015 1156   BILITOT 0.7 10/05/2017 1010      Impression and Plan: Mr. Hakim is a very pleasant 72 yo caucasian gentleman with progressive metastatic castrate resistant prostate cancer.  He will be very interesting to see what his PSA is.  It was 141 prior to his chemotherapy.  Hopefully we will see go down nicely.  We will proceed with a second cycle of treatment.  I want him back in 10 days just to check his blood counts.  Again, I really doubt that his platelet count is low enough that would have caused this retinal bleeding.  Again I told him to stop the Advil.  We will plan to see him back in 3 weeks for his third cycle of Taxotere.  I will then plan for a follow-up Axumin PET scan afterwards.    Volanda Napoleon, MD 5/15/201911:30 AM

## 2017-10-05 NOTE — Addendum Note (Signed)
Addended by: Burney Gauze R on: 10/05/2017 11:58 AM   Modules accepted: Orders

## 2017-10-05 NOTE — Telephone Encounter (Signed)
NO LOS ON VISIT 10/05/17

## 2017-10-06 LAB — PROSTATE-SPECIFIC AG, SERUM (LABCORP): PROSTATE SPECIFIC AG, SERUM: 159.1 ng/mL — AB (ref 0.0–4.0)

## 2017-10-06 LAB — TESTOSTERONE: Testosterone: <3 ng/dL — ABNORMAL LOW (ref 264–916)

## 2017-10-07 ENCOUNTER — Inpatient Hospital Stay: Payer: Medicare Other

## 2017-10-07 ENCOUNTER — Other Ambulatory Visit: Payer: Self-pay

## 2017-10-07 VITALS — BP 129/71 | HR 72 | Temp 98.9°F | Resp 18

## 2017-10-07 DIAGNOSIS — C61 Malignant neoplasm of prostate: Secondary | ICD-10-CM

## 2017-10-07 DIAGNOSIS — C7951 Secondary malignant neoplasm of bone: Principal | ICD-10-CM

## 2017-10-07 DIAGNOSIS — Z5189 Encounter for other specified aftercare: Secondary | ICD-10-CM | POA: Diagnosis not present

## 2017-10-07 MED ORDER — PEGFILGRASTIM-CBQV 6 MG/0.6ML ~~LOC~~ SOSY
PREFILLED_SYRINGE | SUBCUTANEOUS | Status: AC
  Filled 2017-10-07: qty 0.6

## 2017-10-07 MED ORDER — PEGFILGRASTIM-CBQV 6 MG/0.6ML ~~LOC~~ SOSY
6.0000 mg | PREFILLED_SYRINGE | Freq: Once | SUBCUTANEOUS | Status: AC
  Administered 2017-10-07: 6 mg via SUBCUTANEOUS

## 2017-10-07 NOTE — Patient Instructions (Signed)
Pegfilgrastim injection What is this medicine? PEGFILGRASTIM (PEG fil gra stim) is a long-acting granulocyte colony-stimulating factor that stimulates the growth of neutrophils, a type of white blood cell important in the body's fight against infection. It is used to reduce the incidence of fever and infection in patients with certain types of cancer who are receiving chemotherapy that affects the bone marrow, and to increase survival after being exposed to high doses of radiation. This medicine may be used for other purposes; ask your health care provider or pharmacist if you have questions. COMMON BRAND NAME(S): Neulasta What should I tell my health care provider before I take this medicine? They need to know if you have any of these conditions: -kidney disease -latex allergy -ongoing radiation therapy -sickle cell disease -skin reactions to acrylic adhesives (On-Body Injector only) -an unusual or allergic reaction to pegfilgrastim, filgrastim, other medicines, foods, dyes, or preservatives -pregnant or trying to get pregnant -breast-feeding How should I use this medicine? This medicine is for injection under the skin. If you get this medicine at home, you will be taught how to prepare and give the pre-filled syringe or how to use the On-body Injector. Refer to the patient Instructions for Use for detailed instructions. Use exactly as directed. Tell your healthcare provider immediately if you suspect that the On-body Injector may not have performed as intended or if you suspect the use of the On-body Injector resulted in a missed or partial dose. It is important that you put your used needles and syringes in a special sharps container. Do not put them in a trash can. If you do not have a sharps container, call your pharmacist or healthcare provider to get one. Talk to your pediatrician regarding the use of this medicine in children. While this drug may be prescribed for selected conditions,  precautions do apply. Overdosage: If you think you have taken too much of this medicine contact a poison control center or emergency room at once. NOTE: This medicine is only for you. Do not share this medicine with others. What if I miss a dose? It is important not to miss your dose. Call your doctor or health care professional if you miss your dose. If you miss a dose due to an On-body Injector failure or leakage, a new dose should be administered as soon as possible using a single prefilled syringe for manual use. What may interact with this medicine? Interactions have not been studied. Give your health care provider a list of all the medicines, herbs, non-prescription drugs, or dietary supplements you use. Also tell them if you smoke, drink alcohol, or use illegal drugs. Some items may interact with your medicine. This list may not describe all possible interactions. Give your health care provider a list of all the medicines, herbs, non-prescription drugs, or dietary supplements you use. Also tell them if you smoke, drink alcohol, or use illegal drugs. Some items may interact with your medicine. What should I watch for while using this medicine? You may need blood work done while you are taking this medicine. If you are going to need a MRI, CT scan, or other procedure, tell your doctor that you are using this medicine (On-Body Injector only). What side effects may I notice from receiving this medicine? Side effects that you should report to your doctor or health care professional as soon as possible: -allergic reactions like skin rash, itching or hives, swelling of the face, lips, or tongue -dizziness -fever -pain, redness, or irritation at site   where injected -pinpoint red spots on the skin -red or dark-brown urine -shortness of breath or breathing problems -stomach or side pain, or pain at the shoulder -swelling -tiredness -trouble passing urine or change in the amount of urine Side  effects that usually do not require medical attention (report to your doctor or health care professional if they continue or are bothersome): -bone pain -muscle pain This list may not describe all possible side effects. Call your doctor for medical advice about side effects. You may report side effects to FDA at 1-800-FDA-1088. Where should I keep my medicine? Keep out of the reach of children. Store pre-filled syringes in a refrigerator between 2 and 8 degrees C (36 and 46 degrees F). Do not freeze. Keep in carton to protect from light. Throw away this medicine if it is left out of the refrigerator for more than 48 hours. Throw away any unused medicine after the expiration date. NOTE: This sheet is a summary. It may not cover all possible information. If you have questions about this medicine, talk to your doctor, pharmacist, or health care provider.  2018 Elsevier/Gold Standard (2016-05-06 12:58:03)  

## 2017-10-14 ENCOUNTER — Inpatient Hospital Stay: Payer: Medicare Other

## 2017-10-14 DIAGNOSIS — C61 Malignant neoplasm of prostate: Secondary | ICD-10-CM

## 2017-10-14 DIAGNOSIS — Z5189 Encounter for other specified aftercare: Secondary | ICD-10-CM | POA: Diagnosis not present

## 2017-10-14 DIAGNOSIS — C7951 Secondary malignant neoplasm of bone: Principal | ICD-10-CM

## 2017-10-14 LAB — CBC WITH DIFFERENTIAL (CANCER CENTER ONLY)
BAND NEUTROPHILS: 20 %
BASOS ABS: 0 10*3/uL (ref 0.0–0.1)
BASOS PCT: 0 %
BLASTS: 0 %
EOS ABS: 0 10*3/uL (ref 0.0–0.5)
Eosinophils Relative: 0 %
HEMATOCRIT: 28 % — AB (ref 38.7–49.9)
HEMOGLOBIN: 9.2 g/dL — AB (ref 13.0–17.1)
Lymphocytes Relative: 11 %
Lymphs Abs: 3.7 10*3/uL — ABNORMAL HIGH (ref 0.9–3.3)
MCH: 33.3 pg (ref 28.0–33.4)
MCHC: 32.9 g/dL (ref 32.0–35.9)
MCV: 101.4 fL — ABNORMAL HIGH (ref 82.0–98.0)
METAMYELOCYTES PCT: 6 %
MONO ABS: 1 10*3/uL — AB (ref 0.1–0.9)
MYELOCYTES: 2 %
Monocytes Relative: 3 %
Neutro Abs: 29.3 10*3/uL — ABNORMAL HIGH (ref 1.5–6.5)
Neutrophils Relative %: 58 %
OTHER: 0 %
PROMYELOCYTES RELATIVE: 0 %
Platelet Count: 163 10*3/uL (ref 145–400)
RBC: 2.76 MIL/uL — ABNORMAL LOW (ref 4.20–5.70)
RDW: 17.4 % — ABNORMAL HIGH (ref 11.1–15.7)
WBC Count: 34 10*3/uL — ABNORMAL HIGH (ref 4.0–10.0)
nRBC: 1 /100 WBC — ABNORMAL HIGH

## 2017-10-14 LAB — CMP (CANCER CENTER ONLY)
ALBUMIN: 3.2 g/dL — AB (ref 3.5–5.0)
ALK PHOS: 78 U/L (ref 26–84)
ALT: 19 U/L (ref 10–47)
AST: 20 U/L (ref 11–38)
Anion gap: 7 (ref 5–15)
BILIRUBIN TOTAL: 0.7 mg/dL (ref 0.2–1.6)
BUN: 16 mg/dL (ref 7–22)
CALCIUM: 9.7 mg/dL (ref 8.0–10.3)
CHLORIDE: 106 mmol/L (ref 98–108)
CO2: 30 mmol/L (ref 18–33)
CREATININE: 1.3 mg/dL — AB (ref 0.60–1.20)
Glucose, Bld: 101 mg/dL (ref 73–118)
Potassium: 4.6 mmol/L (ref 3.3–4.7)
SODIUM: 143 mmol/L (ref 128–145)
TOTAL PROTEIN: 6.5 g/dL (ref 6.4–8.1)

## 2017-10-24 ENCOUNTER — Emergency Department (HOSPITAL_BASED_OUTPATIENT_CLINIC_OR_DEPARTMENT_OTHER)
Admission: EM | Admit: 2017-10-24 | Discharge: 2017-10-24 | Disposition: A | Discharge status: Home / Self Care | Payer: Medicare Other | Attending: Emergency Medicine | Admitting: Emergency Medicine

## 2017-10-24 ENCOUNTER — Encounter (HOSPITAL_BASED_OUTPATIENT_CLINIC_OR_DEPARTMENT_OTHER): Payer: Self-pay | Admitting: Emergency Medicine

## 2017-10-24 ENCOUNTER — Emergency Department (HOSPITAL_BASED_OUTPATIENT_CLINIC_OR_DEPARTMENT_OTHER): Payer: Medicare Other

## 2017-10-24 ENCOUNTER — Telehealth: Payer: Self-pay | Admitting: *Deleted

## 2017-10-24 ENCOUNTER — Other Ambulatory Visit: Payer: Self-pay

## 2017-10-24 DIAGNOSIS — F17210 Nicotine dependence, cigarettes, uncomplicated: Secondary | ICD-10-CM | POA: Diagnosis not present

## 2017-10-24 DIAGNOSIS — E860 Dehydration: Secondary | ICD-10-CM | POA: Insufficient documentation

## 2017-10-24 DIAGNOSIS — Z8546 Personal history of malignant neoplasm of prostate: Secondary | ICD-10-CM | POA: Insufficient documentation

## 2017-10-24 DIAGNOSIS — C7951 Secondary malignant neoplasm of bone: Secondary | ICD-10-CM | POA: Insufficient documentation

## 2017-10-24 DIAGNOSIS — R531 Weakness: Secondary | ICD-10-CM

## 2017-10-24 DIAGNOSIS — Z79899 Other long term (current) drug therapy: Secondary | ICD-10-CM | POA: Insufficient documentation

## 2017-10-24 DIAGNOSIS — I714 Abdominal aortic aneurysm, without rupture: Secondary | ICD-10-CM | POA: Diagnosis not present

## 2017-10-24 DIAGNOSIS — J449 Chronic obstructive pulmonary disease, unspecified: Secondary | ICD-10-CM | POA: Diagnosis not present

## 2017-10-24 LAB — URINALYSIS, ROUTINE W REFLEX MICROSCOPIC
BILIRUBIN URINE: NEGATIVE
Glucose, UA: NEGATIVE mg/dL
HGB URINE DIPSTICK: NEGATIVE
KETONES UR: NEGATIVE mg/dL
Leukocytes, UA: NEGATIVE
NITRITE: NEGATIVE
PROTEIN: 30 mg/dL — AB
Specific Gravity, Urine: >1.03 — ABNORMAL HIGH (ref 1.005–1.030)
pH: 5.5 (ref 5.0–8.0)

## 2017-10-24 LAB — BASIC METABOLIC PANEL
ANION GAP: 9 (ref 5–15)
BUN: 17 mg/dL (ref 6–20)
CALCIUM: 8.8 mg/dL — AB (ref 8.9–10.3)
CO2: 24 mmol/L (ref 22–32)
Chloride: 103 mmol/L (ref 101–111)
Creatinine, Ser: 1.03 mg/dL (ref 0.61–1.24)
Glucose, Bld: 119 mg/dL — ABNORMAL HIGH (ref 65–99)
Potassium: 4.2 mmol/L (ref 3.5–5.1)
Sodium: 136 mmol/L (ref 135–145)

## 2017-10-24 LAB — URINALYSIS, MICROSCOPIC (REFLEX)

## 2017-10-24 LAB — HEPATIC FUNCTION PANEL
ALK PHOS: 65 U/L (ref 38–126)
ALT: 10 U/L — ABNORMAL LOW (ref 17–63)
AST: 20 U/L (ref 15–41)
Albumin: 3.6 g/dL (ref 3.5–5.0)
BILIRUBIN DIRECT: 0.2 mg/dL (ref 0.1–0.5)
BILIRUBIN INDIRECT: 0.8 mg/dL (ref 0.3–0.9)
BILIRUBIN TOTAL: 1 mg/dL (ref 0.3–1.2)
Total Protein: 7.7 g/dL (ref 6.5–8.1)

## 2017-10-24 LAB — CBC
HCT: 29.9 % — ABNORMAL LOW (ref 39.0–52.0)
HEMOGLOBIN: 10 g/dL — AB (ref 13.0–17.0)
MCH: 33.3 pg (ref 26.0–34.0)
MCHC: 33.4 g/dL (ref 30.0–36.0)
MCV: 99.7 fL (ref 78.0–100.0)
PLATELETS: 231 10*3/uL (ref 150–400)
RBC: 3 MIL/uL — ABNORMAL LOW (ref 4.22–5.81)
RDW: 17.7 % — ABNORMAL HIGH (ref 11.5–15.5)
WBC: 13 10*3/uL — ABNORMAL HIGH (ref 4.0–10.5)

## 2017-10-24 LAB — TROPONIN I: Troponin I: <0.03 ng/mL (ref <0.03)

## 2017-10-24 MED ORDER — SODIUM CHLORIDE 0.9 % IV BOLUS
500.0000 mL | Freq: Once | INTRAVENOUS | Status: AC
  Administered 2017-10-24: 500 mL via INTRAVENOUS

## 2017-10-24 NOTE — ED Provider Notes (Addendum)
[facility] EMERGENCY DEPARTMENT Provider Note   CSN: [id number] Arrival date & time: 10/24/17  1319     History   Chief Complaint Chief Complaint  Patient presents with  . Shortness of Breath    HPI Alexander Ellis is a 72 y.o. male.  Patient with history of metastatic prostate cancer with bony metastasis on chemo (last 10/05/17), COPD, abdominal aortic aneurysm --presents with generalized weakness and multiple falls yesterday.  Patient states that he was too weak to keep himself up.  He states that he fell against a mirror in a bathroom hitting his head.  He also fell onto the floor after leaving and fell backwards on the bed and hit the side of a dresser.  He stayed in bed most of the yesterday per family.  Today he is stronger but continued to have difficulty to walk per family.  They note his breathing has been more labored.  They report patient being on several medications which may cause him to be weak including dronabinol --which patient states makes him feel drunk after taking it.  It does help with his nausea however.  Family reports poor appetite and fluid intake.  Patient denies fevers, chest pain, shortness of breath, abdominal pain.  He has constipation but no diarrhea.  He is controlling his bowels and his bladder.  He reports a numb patch on his lateral right thigh for the past 2 weeks.  He also reports pain in his left lateral lower back.     Past Medical History:  Diagnosis Date  . Anemia   . Aneurysm of abdominal aorta (HCC)   . Bone metastasis ([place])   . Cancer [facility]) 2014   prostate  . Chronic hepatitis B virus infection ([place])   . Cirrhosis ([place])   . COPD (chronic obstructive pulmonary disease) ([place])   . Dyspnea   . GERD (gastroesophageal reflux disease)   . GERD (gastroesophageal reflux disease)   . Hand pain   . Hepatitis   . Hypotension   . Lung nodules   . Osteoarthritis     Patient Active Problem List   Diagnosis Date Noted  . Carotid  stenosis 07/26/2017  . 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 Prostate cancer metastatic to bone ([place]) 01/01/2015  . Hx of prostatectomy 07/16/2014  . Tobacco use disorder 07/16/2014  . Counseling regarding goals of care 07/16/2014  . Constipation 06/04/2014  . AAA (abdominal aortic aneurysm) ([place]) 10/12/2012  . Chronic hepatitis B ([place]) 10/12/2012    Past Surgical History:  Procedure Laterality Date  . APPENDECTOMY    . BACK SURGERY     [phone]  . IR FLUORO GUIDE PORT INSERTION RIGHT  09/06/2017  . IR US GUIDE VASC ACCESS RIGHT  09/06/2017  . PROSTATECTOMY  2012  . THROAT SURGERY    . TONSILLECTOMY          Home Medications    Prior to Admission medications   Medication Sig Start Date End Date Taking? Authorizing Provider  AZOPT 1 % ophthalmic suspension INSTILL 1 DROP TWICE A DAY INTO LEFT EYE 08/11/17   [provider]  Cholecalciferol (VITAMIN D-3) 1000 UNITS CAPS Take 1 capsule by mouth daily.    [provider]  COMBIGAN 0.2-0.5 % ophthalmic solution PLACE 1 DROP IN EACH EYE TWICE (2) DAILY 08/13/17   [provider]  dexamethasone (DECADRON) 4 MG tablet Take 2 tablets (8 mg total) by mouth 2 (two) times daily. Start the day before Taxotere. Then  daily after chemo for 2 days. 09/14/17   Volanda Napoleon, MD  dronabinol (MARINOL) 2.5 MG capsule Take 1 capsule (2.5 mg total) by mouth 2 (two) times daily before lunch and supper. 08/26/17   Volanda Napoleon, MD  lidocaine-prilocaine (EMLA) cream Apply 1 application topically as needed. Apply to port 1-2 hours before coming for treatment.  1/2 tbsp applied but do not rub in.  Place plastic covering over cream. 09/08/17   Volanda Napoleon, MD  lidocaine-prilocaine (EMLA) cream Apply to affected area once 09/14/17   Ennever, Rudell Cobb, MD  LORazepam (ATIVAN) 0.5 MG tablet Take 1 tablet (0.5 mg total) by mouth every 6 (six) hours as needed (Nausea or vomiting). 09/14/17   Volanda Napoleon, MD  milk  thistle 175 MG tablet Take 175 mg by mouth daily.    [provider]  ondansetron (ZOFRAN) 8 MG tablet Take 1 tablet (8 mg total) by mouth 2 (two) times daily as needed for refractory nausea / vomiting. 09/14/17   Volanda Napoleon, MD  OVER THE COUNTER MEDICATION Take 1 capsule by mouth 2 (two) times daily.     [provider]  prochlorperazine (COMPAZINE) 10 MG tablet Take 1 tablet (10 mg total) by mouth every 6 (six) hours as needed (Nausea or vomiting). 10/05/17   Volanda Napoleon, MD  ranitidine (ZANTAC) 300 MG tablet Take by mouth. 06/01/17   [provider]  vitamin B-12 (CYANOCOBALAMIN) 100 MCG tablet Take 100 mcg by mouth daily.    [provider]    Family History Family History  Problem Relation Age of Onset  . Heart disease Mother   . Hypertension Mother     Social History Social History   Tobacco Use  . Smoking status: Current Every Day Smoker    Packs/day: 0.50    Years: 60.00    Pack years: 30.00    Types: Cigarettes  . Smokeless tobacco: Never Used  . Tobacco comment: pack a day  Substance Use Topics  . Alcohol use: No    Alcohol/week: 0.0 oz  . Drug use: No     Allergies   Oxycodone; Penicillins; Codeine; Other; and Hydrocodone-acetaminophen   Review of Systems Review of Systems  Constitutional: Positive for appetite change. Negative for fever.  HENT: Negative for rhinorrhea and sore throat.   Eyes: Negative for redness.  Respiratory: Positive for shortness of breath. Negative for cough.   Cardiovascular: Negative for chest pain.  Gastrointestinal: Positive for constipation and nausea. Negative for abdominal pain, diarrhea and vomiting.  Genitourinary: Negative for dysuria.  Musculoskeletal: Positive for back pain. Negative for myalgias.  Skin: Negative for rash.  Neurological: Positive for weakness and numbness. Negative for headaches.     Physical Exam Updated Vital Signs BP 99/65 (BP Location: Right Arm)   Pulse  86   Temp 98.1 F (36.7 C) (Oral)   Resp (!) 22   Ht 5\' 9"  (1.753 m)   Wt 64.9 kg (143 lb)   SpO2 100%   BMI 21.12 kg/m   Physical Exam  Constitutional: He is oriented to person, place, and time. He appears well-developed and well-nourished.  HENT:  Head: Normocephalic and atraumatic.  Right Ear: Tympanic membrane, external ear and ear canal normal.  Left Ear: Tympanic membrane, external ear and ear canal normal.  Nose: Nose normal.  Mouth/Throat: Uvula is midline, oropharynx is clear and moist and mucous membranes are normal.  Eyes: Conjunctivae, EOM and lids are normal. Right eye exhibits  no discharge. Left eye exhibits no discharge. Pupils are unequal.    Neck: Normal range of motion. Neck supple.  Cardiovascular: Normal rate, regular rhythm and normal heart sounds.  Pulmonary/Chest: Effort normal and breath sounds normal.  Abdominal: Soft. There is no tenderness. There is no rebound and no guarding.  Musculoskeletal: Normal range of motion.       Cervical back: He exhibits normal range of motion, no tenderness and no bony tenderness.       Right lower leg: He exhibits no tenderness and no edema.       Left lower leg: He exhibits no tenderness and no edema.  Neurological: He is alert and oriented to person, place, and time. He has normal strength and normal reflexes. No cranial nerve deficit or sensory deficit. He exhibits normal muscle tone. He displays a negative Romberg sign. GCS eye subscore is 4. GCS verbal subscore is 5. GCS motor subscore is 6.  Skin: Skin is warm and dry.  Psychiatric: He has a normal mood and affect.  Nursing note and vitals reviewed.    ED Treatments / Results  Labs (all labs ordered are listed, but only abnormal results are displayed) Labs Reviewed  BASIC METABOLIC PANEL - Abnormal; Notable for the following components:      Result Value   Glucose, Bld 119 (*)    Calcium 8.8 (*)    All other components within normal limits  CBC - Abnormal;  Notable for the following components:   WBC 13.0 (*)    RBC 3.00 (*)    Hemoglobin 10.0 (*)    HCT 29.9 (*)    RDW 17.7 (*)    All other components within normal limits  HEPATIC FUNCTION PANEL - Abnormal; Notable for the following components:   ALT 10 (*)    All other components within normal limits  URINALYSIS, ROUTINE W REFLEX MICROSCOPIC - Abnormal; Notable for the following components:   Specific Gravity, Urine >1.030 (*)    Protein, ur 30 (*)    All other components within normal limits  URINALYSIS, MICROSCOPIC (REFLEX) - Abnormal; Notable for the following components:   Bacteria, UA RARE (*)    All other components within normal limits  TROPONIN I    EKG None  Radiology Dg Chest 2 View  Result Date: 10/24/2017 CLINICAL DATA:  Back pain and shortness of breath for a few days EXAM: CHEST - 2 VIEW COMPARISON:  09/22/2017 FINDINGS: Cardiac shadow is stable. Diffuse chronic changes are again identified bilaterally relatively stable given some technical variation in the imaging. Chest wall port on the right is noted. Old rib fractures on the left are seen. IMPRESSION: Chronic changes without acute focal abnormality. Electronically Signed   By: Inez Catalina M.D.   On: 10/24/2017 14:47    Procedures Procedures (including critical care time)  Medications Ordered in ED Medications  sodium chloride 0.9 % bolus 500 mL (500 mLs Intravenous New Bag/Given 10/24/17 1506)     Initial Impression / Assessment and Plan / ED Course  I have reviewed the triage vital signs and the nursing notes.  Pertinent labs & imaging results that were available during my care of the patient were reviewed by me and considered in my medical decision making (see chart for details).     Patient seen and examined. Work-up initiated. Fluids ordered. Pt discussed with Dr. Laverta Baltimore.   Vital signs reviewed and are as follows: BP 111/70   Pulse 81   Temp 98.1 F (36.7  C) (Oral)   Resp 18   Ht 5\' 9"  (1.753 m)    Wt 64.9 kg (143 lb)   SpO2 98%   BMI 21.12 kg/m   3:59 PM Dr. Laverta Baltimore has seen. Work-up without acute abnormality. Will discuss with patient's oncologist.   5:41 PM Spoke earlier with Dr. Jana Hakim who is aware of patient.   Patient hydrated in the ED.  He is feeling a bit better.  He is ambulated.  He became short of breath at the end of ambulation but is otherwise okay.  Patient feels strongly about going home today.  Family is comfortable with watching him.  They have follow-up with oncologist in 2 days.  I discussed with the patient that he needs to make a concerted effort to keep himself hydrated at home.  But if family to return with worsening symptoms, new symptoms, fevers, chest pain, worsening shortness of breath, other concerns.  They verbalized understanding and agrees with plan.  Final Clinical Impressions(s) / ED Diagnoses   Final diagnoses:  Dehydration  Generalized weakness   Patient with history of metastatic prostate cancer presents with multiple symptoms, likely related to deconditioning.  He is currently in the midst of chemotherapy treatments.  Work-up today is largely reassuring.  Patient felt better with hydration.  Urine was concentrated and there may be some hemoconcentration noted in the CBC compared to baseline.  Patient felt better with IV fluids.  He is not ambulating.  No signs of stroke on exam or on head CT.  Discussed patient case with patient's oncologist.  Patient wants to go home and family is comfortable with monitoring at home.  No indications for admission at this point.  He has follow-up in 2 days.  ED Discharge Orders    None         Carlisle Cater, Hershal Coria 10/24/17 1743    Long, Wonda Olds, MD 10/25/17 1006

## 2017-10-24 NOTE — ED Notes (Signed)
ED Provider at bedside. 

## 2017-10-24 NOTE — ED Notes (Addendum)
Pt ambulated around nurses station with steady gate. Upon returning to room pt SpO2 down to 88% and pt had labored breathing. After getting situated in bed, pt SpO2 back up to 94% on RA.

## 2017-10-24 NOTE — Discharge Instructions (Signed)
Please read and follow all provided instructions.  Your diagnoses today include:  1. Dehydration   2. Generalized weakness     Tests performed today include:  Ct imaging of your head -no acute strokes or other problems  Blood counts and electrolytes  EKG and blood test for heart  Urine test -suggest dehydration  X-ray of your back and chest do not show any broken bones or infections  Vital signs. See below for your results today.   Medications prescribed:   None  Take any prescribed medications only as directed.  Home care instructions:  Follow any educational materials contained in this packet.  BE VERY CAREFUL not to take multiple medicines containing Tylenol (also called acetaminophen). Doing so can lead to an overdose which can damage your liver and cause liver failure and possibly death.   Follow-up instructions: Please follow-up with your primary care provider in the next 3 days for further evaluation of your symptoms.   Return instructions:   Please return to the Emergency Department if you experience worsening symptoms.   Please return if you have any other emergent concerns.  Additional Information:  Your vital signs today were: BP 100/65    Pulse 82    Temp 98.1 F (36.7 C) (Oral)    Resp 19    Ht 5\' 9"  (1.753 m)    Wt 64.9 kg (143 lb)    SpO2 96%    BMI 21.12 kg/m  If your blood pressure (BP) was elevated above 135/85 this visit, please have this repeated by your doctor within one month. --------------

## 2017-10-24 NOTE — Telephone Encounter (Signed)
Patient's wife notifying the office that patient has had a difficult time after his initial treatment with TAXOTERE. She states he's extremely weak, wants to stay in bed most of the day, and at times has had difficulty ambulating. He also has had some back pain and fall x 2.  She is worried about the ability for patient to receive treatment again this week with the significance of his symptoms.  He is scheduled for this Wednesday to have labs drawn, see the provider and for cycle two of treatment.   Wife states he's on the marinol, and seems more dizzy and lethargic. He has also used ativan for nausea.  Reviewed with patient's wife his scheduled appointments this week. Explained that during the appointment with the provider, all their concerns and experience after cycle one should be shared. The provider may make changes to the plan, or provide more supportive care. She understood. They will write down a list of their concerns and questions for their scheduled appointment this Wednesday.   After this discussion, wife felt like he was too sick for her to get him to the appointment this week. She states he's too weak and unstable on his feet. At this time, patient's wife is instructed to bring patient to the ED (or call EMS) for acute assessment. She states she has a family member who can help, and she'll bring him to the ED for work up.   Dr Marin Olp notified.

## 2017-10-24 NOTE — ED Triage Notes (Signed)
Patient states that he fell yesterday hitting his head. The patient also to his right lower back. The patient is jaundices, has slurred speech and is having SOB - the patient has decreased O2 sat.

## 2017-10-25 ENCOUNTER — Other Ambulatory Visit: Payer: Self-pay | Admitting: Oncology

## 2017-10-26 ENCOUNTER — Other Ambulatory Visit: Payer: Self-pay | Admitting: *Deleted

## 2017-10-26 ENCOUNTER — Inpatient Hospital Stay: Payer: Medicare Other

## 2017-10-26 ENCOUNTER — Inpatient Hospital Stay: Payer: Medicare Other | Attending: Hematology & Oncology | Admitting: Family

## 2017-10-26 ENCOUNTER — Other Ambulatory Visit: Payer: Self-pay

## 2017-10-26 ENCOUNTER — Encounter: Payer: Self-pay | Admitting: Family

## 2017-10-26 VITALS — BP 107/63 | HR 75 | Temp 97.4°F | Resp 20 | Wt 135.0 lb

## 2017-10-26 DIAGNOSIS — Z5189 Encounter for other specified aftercare: Secondary | ICD-10-CM | POA: Diagnosis present

## 2017-10-26 DIAGNOSIS — C61 Malignant neoplasm of prostate: Secondary | ICD-10-CM

## 2017-10-26 DIAGNOSIS — D500 Iron deficiency anemia secondary to blood loss (chronic): Secondary | ICD-10-CM | POA: Diagnosis not present

## 2017-10-26 DIAGNOSIS — Z79899 Other long term (current) drug therapy: Secondary | ICD-10-CM | POA: Diagnosis not present

## 2017-10-26 DIAGNOSIS — C7951 Secondary malignant neoplasm of bone: Secondary | ICD-10-CM

## 2017-10-26 DIAGNOSIS — R530 Neoplastic (malignant) related fatigue: Secondary | ICD-10-CM

## 2017-10-26 DIAGNOSIS — M791 Myalgia, unspecified site: Secondary | ICD-10-CM | POA: Insufficient documentation

## 2017-10-26 DIAGNOSIS — D649 Anemia, unspecified: Secondary | ICD-10-CM

## 2017-10-26 DIAGNOSIS — H538 Other visual disturbances: Secondary | ICD-10-CM | POA: Insufficient documentation

## 2017-10-26 DIAGNOSIS — R0602 Shortness of breath: Secondary | ICD-10-CM

## 2017-10-26 DIAGNOSIS — R531 Weakness: Secondary | ICD-10-CM

## 2017-10-26 DIAGNOSIS — Z95828 Presence of other vascular implants and grafts: Secondary | ICD-10-CM

## 2017-10-26 DIAGNOSIS — R634 Abnormal weight loss: Secondary | ICD-10-CM

## 2017-10-26 DIAGNOSIS — Z5111 Encounter for antineoplastic chemotherapy: Secondary | ICD-10-CM | POA: Diagnosis present

## 2017-10-26 LAB — CBC WITH DIFFERENTIAL (CANCER CENTER ONLY)
BASOS PCT: 0 %
Basophils Absolute: 0 10*3/uL (ref 0.0–0.1)
Eosinophils Absolute: 0 10*3/uL (ref 0.0–0.5)
Eosinophils Relative: 0 %
HEMATOCRIT: 25.3 % — AB (ref 38.7–49.9)
HEMOGLOBIN: 8.4 g/dL — AB (ref 13.0–17.1)
LYMPHS ABS: 0.5 10*3/uL — AB (ref 0.9–3.3)
LYMPHS PCT: 6 %
MCH: 33.2 pg (ref 28.0–33.4)
MCHC: 33.2 g/dL (ref 32.0–35.9)
MCV: 100 fL — AB (ref 82.0–98.0)
MONO ABS: 0.8 10*3/uL (ref 0.1–0.9)
MONOS PCT: 10 %
NEUTROS ABS: 6.7 10*3/uL — AB (ref 1.5–6.5)
NEUTROS PCT: 84 %
Platelet Count: 228 10*3/uL (ref 145–400)
RBC: 2.53 MIL/uL — ABNORMAL LOW (ref 4.20–5.70)
RDW: 17.3 % — AB (ref 11.1–15.7)
WBC Count: 7.9 10*3/uL (ref 4.0–10.0)

## 2017-10-26 LAB — CMP (CANCER CENTER ONLY)
ALBUMIN: 3 g/dL — AB (ref 3.5–5.0)
ALK PHOS: 64 U/L (ref 26–84)
ALT: 17 U/L (ref 10–47)
ANION GAP: 8 (ref 5–15)
AST: 19 U/L (ref 11–38)
BILIRUBIN TOTAL: 0.6 mg/dL (ref 0.2–1.6)
BUN: 20 mg/dL (ref 7–22)
CHLORIDE: 106 mmol/L (ref 98–108)
CO2: 25 mmol/L (ref 18–33)
CREATININE: 1.1 mg/dL (ref 0.60–1.20)
Calcium: 9 mg/dL (ref 8.0–10.3)
Glucose, Bld: 127 mg/dL — ABNORMAL HIGH (ref 73–118)
Potassium: 3.9 mmol/L (ref 3.3–4.7)
Sodium: 139 mmol/L (ref 128–145)
Total Protein: 7.2 g/dL (ref 6.4–8.1)

## 2017-10-26 LAB — PREPARE RBC (CROSSMATCH)

## 2017-10-26 LAB — SAMPLE TO BLOOD BANK

## 2017-10-26 MED ORDER — SODIUM CHLORIDE 0.9% FLUSH
10.0000 mL | INTRAVENOUS | Status: DC | PRN
  Administered 2017-10-26: 10 mL via INTRAVENOUS
  Filled 2017-10-26: qty 10

## 2017-10-26 MED ORDER — HEPARIN SOD (PORK) LOCK FLUSH 100 UNIT/ML IV SOLN
500.0000 [IU] | Freq: Once | INTRAVENOUS | Status: AC
  Administered 2017-10-26: 500 [IU] via INTRAVENOUS
  Filled 2017-10-26: qty 5

## 2017-10-26 NOTE — Progress Notes (Signed)
Type and Crossmatch drawn via port for blood transfusion on 10/28/17.  Pt instructed to leave blue blood bracelet on for transfusion on 10/28/17.  Teach back done.  Pt verbalizes an understanding to leave blood bracelet on.  Schedule printed for pt with appt time.

## 2017-10-26 NOTE — Progress Notes (Signed)
Hematology and Oncology Follow Up Visit  Alexander Ellis [id number] 27-Jul-1945 72 y.o. 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 He is symptomatic with fatigue, weakness, several recent falls including one this morning, SOB and weight loss (down 7 lbs today from last week).  His PSA is now up to 235.5.  He feels that the Marinol seems to be making him dizzy and potentially contributing to his falls. He tripped and slid on a wet bathroom towel with his fall this morning. He has several abrasions on his left arm but denies injury.  He has multiple teeth that are loose effecting his ability to chew and eat. He is supplementing some with carnation instant breakfast in ice cream. We also suggested Boost or Ensure as well, several times a day.  He was in the ED earlier this week with dehydration and treated with fluids. He states that he is hydrating better now at home.  No fever, chills, n/v, cough, rash, chest pain, palpitations, abdominal pain or changes in bowel or bladder habits.  He takes a stool softener as needed for constipation.  No swelling, tenderness, numbness or tingling in his extremities.    ECOG Performance Status: 2 - Symptomatic, <50% confined to bed  Medications:  Allergies as of 10/26/2017      Reactions   Oxycodone Other (See Comments)   somnolence Sleep all the time   Penicillins Hives, Rash   Codeine Nausea And Vomiting   Other Nausea Only, Other (See Comments)   Uncoded Allergy. Allergen: IV contrast   Hydrocodone-acetaminophen Nausea And Vomiting      Medication List        Accurate as of 10/26/17 11:49 AM. Always use your most recent med list.          AZOPT 1 % ophthalmic suspension Generic drug:  brinzolamide INSTILL 1 DROP TWICE A DAY INTO both eyes-reported  10/26/17   COMBIGAN 0.2-0.5 % ophthalmic solution Generic drug:  brimonidine-timolol PLACE 1 DROP IN EACH EYE TWICE (2) DAILY   dexamethasone 4 MG tablet Commonly known as:  DECADRON Take 2 tablets (8 mg total) by mouth 2 (two) times daily. Start the day before Taxotere. Then daily after chemo for 2 days.   dronabinol 2.5 MG capsule Commonly known as:  MARINOL Take 1 capsule (2.5 mg total) by mouth 2 (two) times daily before lunch and supper.   lidocaine-prilocaine cream Commonly known as:  EMLA Apply 1 application topically as needed. Apply to port 1-2 hours before coming for treatment.  1/2 tbsp applied but do not rub in.  Place plastic covering over cream.   lidocaine-prilocaine cream Commonly known as:  EMLA Apply to affected area once   loratadine 10 MG tablet Commonly known as:  CLARITIN Take 10 mg by mouth daily. Daily x days after Neulasta.   LORazepam 0.5 MG tablet Commonly known as:  ATIVAN Take 1 tablet (0.5 mg total) by mouth every 6 (six) hours as needed (Nausea or vomiting).   milk thistle 175 MG tablet Take 175 mg by mouth daily.   ondansetron 8 MG tablet Commonly known as:  ZOFRAN Take 1 tablet (8 mg total) by mouth 2 (two) times daily as needed for refractory nausea / vomiting.   OVER THE COUNTER MEDICATION Take 1 capsule by  mouth 2 (two) times daily.   prochlorperazine 10 MG tablet Commonly known as:  COMPAZINE Take 1 tablet (10 mg total) by mouth every 6 (six) hours as needed (Nausea or vomiting).   ranitidine 300 MG tablet Commonly known as:  ZANTAC Take by mouth daily as needed.   vitamin B-12 100 MCG tablet Commonly known as:  CYANOCOBALAMIN Take 100 mcg by mouth daily.   Vitamin D-3 1000 units Caps Take 1 capsule by mouth daily.       Allergies:  Allergies  Allergen Reactions  . Oxycodone Other (See Comments)    somnolence Sleep all the time  . Penicillins Hives and Rash  . Codeine Nausea And Vomiting  . Other Nausea Only and  Other (See Comments)    Uncoded Allergy. Allergen: IV contrast  . Hydrocodone-Acetaminophen Nausea And Vomiting    Past Medical History, Surgical history, Social history, and Family History were reviewed and updated.  Review of Systems: All other 10 point review of systems is negative.   Physical Exam:  weight is 135 lb (61.2 kg). His oral temperature is 97.4 F (36.3 C) (abnormal). His blood pressure is 107/63 and his pulse is 75. His respiration is 20 and oxygen saturation is 96%.   Wt Readings from Last 3 Encounters:  10/26/17 135 lb (61.2 kg)  10/24/17 143 lb (64.9 kg)  10/05/17 143 lb (64.9 kg)    Ocular: Sclerae unicteric, pupils equal, round and reactive to light Ear-nose-throat: Oropharynx clear, dentition fair Lymphatic: No cervical, supraclavicular or axillary adenopathy Lungs no rales or rhonchi, good excursion bilaterally Heart regular rate and rhythm, no murmur appreciated Abd soft, nontender, positive bowel sounds, no liver or spleen tip palpated on exam, no fluid wave  MSK no focal spinal tenderness, no joint edema Neuro: non-focal, well-oriented, appropriate affect Breasts: Deferred   Lab Results  Component Value Date   WBC 7.9 10/26/2017   HGB 8.4 (L) 10/26/2017   HCT 25.3 (L) 10/26/2017   MCV 100.0 (H) 10/26/2017   PLT 228 10/26/2017   Lab Results  Component Value Date   FERRITIN 1,015 (H) 10/05/2017   IRON 86 10/05/2017   TIBC 256 10/05/2017   UIBC 170 10/05/2017   IRONPCTSAT 34 (L) 10/05/2017   Lab Results  Component Value Date   RETICCTPCT 1.1 01/20/2015   RBC 2.53 (L) 10/26/2017   RETICCTABS 50.2 01/20/2015   No results found for: KPAFRELGTCHN, LAMBDASER, KAPLAMBRATIO No results found for: IGGSERUM, IGA, IGMSERUM No results found for: Alexander Ellis, SPEI   Chemistry      Component Value Date/Time   NA 139 10/26/2017 1115   NA 147 (H) 06/06/2015 1156   K 3.9 10/26/2017 1115   K 5.4  (H) 06/06/2015 1156   CL 106 10/26/2017 1115   CL 106 06/06/2015 1156   CO2 25 10/26/2017 1115   CO2 26 06/06/2015 1156   BUN 20 10/26/2017 1115   BUN 18 06/06/2015 1156   CREATININE 1.10 10/26/2017 1115   CREATININE 1.1 06/06/2015 1156      Component Value Date/Time   CALCIUM 9.0 10/26/2017 1115   CALCIUM 9.6 06/06/2015 1156   ALKPHOS 64 10/26/2017 1115   ALKPHOS 42 06/06/2015 1156   AST 19 10/26/2017 1115   ALT 17 10/26/2017 1115   ALT 13 06/06/2015 1156   BILITOT 0.6 10/26/2017 1115      Impression and Plan: Mr. Norrod is a very pleasant 72 yo caucasian gentleman with progressive castrate resistant  prostate cancer. He is here today symptomatic with fatigue, weakness, several recent falls, SOB and weight loss.  His PSA is now up to 235. Dr. Marin Olp was also able to speak with the patient about his resistance to Taxotere and we will hold treatment today.  We will give him 2 units of blood on Friday. Once he is stable and feeling better we will look into getting him on to [facility].  He will stop taking the Marinol due to dizziness.  We will plan to see him back in another 3 weeks for follow-up.  They will contact our office or the on call service with any questions or concerns and go to the ED in the event of an emergency. We can certainly see him sooner if need be.   Laverna Peace, NP 6/5/201911:49 AM

## 2017-10-26 NOTE — Patient Instructions (Signed)

## 2017-10-27 LAB — ABO/RH: ABO/RH(D): O NEG

## 2017-10-27 LAB — PROSTATE-SPECIFIC AG, SERUM (LABCORP): Prostate Specific Ag, Serum: 235.5 ng/mL — ABNORMAL HIGH (ref 0.0–4.0)

## 2017-10-28 ENCOUNTER — Inpatient Hospital Stay: Payer: Medicare Other

## 2017-10-28 ENCOUNTER — Other Ambulatory Visit: Payer: Self-pay

## 2017-10-28 DIAGNOSIS — C61 Malignant neoplasm of prostate: Secondary | ICD-10-CM

## 2017-10-28 DIAGNOSIS — D649 Anemia, unspecified: Secondary | ICD-10-CM

## 2017-10-28 DIAGNOSIS — C7951 Secondary malignant neoplasm of bone: Principal | ICD-10-CM

## 2017-10-28 MED ORDER — ACETAMINOPHEN 325 MG PO TABS
ORAL_TABLET | ORAL | Status: AC
  Filled 2017-10-28: qty 2

## 2017-10-28 MED ORDER — DIPHENHYDRAMINE HCL 25 MG PO CAPS
ORAL_CAPSULE | ORAL | Status: AC
  Filled 2017-10-28: qty 1

## 2017-10-28 MED ORDER — FUROSEMIDE 10 MG/ML IJ SOLN
INTRAMUSCULAR | Status: AC
  Filled 2017-10-28: qty 4

## 2017-10-28 MED ORDER — SODIUM CHLORIDE 0.9% FLUSH
10.0000 mL | INTRAVENOUS | Status: AC | PRN
  Administered 2017-10-28: 10 mL
  Filled 2017-10-28: qty 10

## 2017-10-28 MED ORDER — HEPARIN SOD (PORK) LOCK FLUSH 100 UNIT/ML IV SOLN
250.0000 [IU] | INTRAVENOUS | Status: AC | PRN
  Administered 2017-10-28: 500 [IU]
  Filled 2017-10-28: qty 5

## 2017-10-28 MED ORDER — ACETAMINOPHEN 325 MG PO TABS
650.0000 mg | ORAL_TABLET | Freq: Once | ORAL | Status: AC
  Administered 2017-10-28: 650 mg via ORAL

## 2017-10-28 MED ORDER — SODIUM CHLORIDE 0.9 % IV SOLN
250.0000 mL | Freq: Once | INTRAVENOUS | Status: AC
  Administered 2017-10-28: 250 mL via INTRAVENOUS

## 2017-10-28 MED ORDER — FUROSEMIDE 10 MG/ML IJ SOLN
20.0000 mg | Freq: Once | INTRAMUSCULAR | Status: AC
  Administered 2017-10-28: 20 mg via INTRAVENOUS

## 2017-10-28 MED ORDER — DIPHENHYDRAMINE HCL 25 MG PO CAPS
25.0000 mg | ORAL_CAPSULE | Freq: Once | ORAL | Status: AC
  Administered 2017-10-28: 25 mg via ORAL

## 2017-10-28 NOTE — Progress Notes (Signed)
1400  10/28/2017  Assisted to car via [place],  accompanied by nurse.

## 2017-10-28 NOTE — Patient Instructions (Signed)

## 2017-10-29 LAB — TYPE AND SCREEN
ABO/RH(D): O NEG
ANTIBODY SCREEN: NEGATIVE
UNIT DIVISION: 0
Unit division: 0
Unit division: 0
Unit division: 0

## 2017-10-29 LAB — BPAM RBC
BLOOD PRODUCT EXPIRATION DATE: 201907072359
Blood Product Expiration Date: 201906252359
Blood Product Expiration Date: 201907062359
Blood Product Expiration Date: 201907062359
ISSUE DATE / TIME: 201906070818
ISSUE DATE / TIME: 201906070818
UNIT TYPE AND RH: 9500
Unit Type and Rh: 9500
Unit Type and Rh: 9500
Unit Type and Rh: 9500

## 2017-11-03 ENCOUNTER — Telehealth: Payer: Self-pay | Admitting: *Deleted

## 2017-11-03 ENCOUNTER — Other Ambulatory Visit: Payer: Self-pay | Admitting: *Deleted

## 2017-11-03 MED ORDER — TRAMADOL HCL 50 MG PO TABS: 50.0000 mg | ORAL_TABLET | Freq: Four times a day (QID) | ORAL | 0 refills | Status: DC | PRN

## 2017-11-03 NOTE — Telephone Encounter (Signed)
Patient c/o backpain which isn't relieved with tylenol and icy hot. He'd like to know if we can send something in for pain.   Reviewed symptoms with Dr Marin Olp. He will send in a prescription to patient's preferred drug store.   Patient is aware of new prescription. Pharmacy confirmed.

## 2017-11-10 ENCOUNTER — Encounter: Payer: Self-pay | Admitting: Hematology & Oncology

## 2017-11-16 ENCOUNTER — Inpatient Hospital Stay (HOSPITAL_BASED_OUTPATIENT_CLINIC_OR_DEPARTMENT_OTHER): Payer: Medicare Other | Admitting: Hematology & Oncology

## 2017-11-16 ENCOUNTER — Inpatient Hospital Stay: Payer: Medicare Other

## 2017-11-16 ENCOUNTER — Other Ambulatory Visit: Payer: Self-pay

## 2017-11-16 ENCOUNTER — Other Ambulatory Visit: Payer: Self-pay | Admitting: *Deleted

## 2017-11-16 ENCOUNTER — Telehealth: Payer: Self-pay | Admitting: Pharmacist

## 2017-11-16 VITALS — BP 101/63 | HR 73 | Temp 97.8°F | Resp 20 | Wt 134.0 lb

## 2017-11-16 DIAGNOSIS — Z95828 Presence of other vascular implants and grafts: Secondary | ICD-10-CM

## 2017-11-16 DIAGNOSIS — D500 Iron deficiency anemia secondary to blood loss (chronic): Secondary | ICD-10-CM

## 2017-11-16 DIAGNOSIS — C7951 Secondary malignant neoplasm of bone: Principal | ICD-10-CM

## 2017-11-16 DIAGNOSIS — C61 Malignant neoplasm of prostate: Secondary | ICD-10-CM

## 2017-11-16 DIAGNOSIS — Z79899 Other long term (current) drug therapy: Secondary | ICD-10-CM

## 2017-11-16 DIAGNOSIS — D649 Anemia, unspecified: Secondary | ICD-10-CM

## 2017-11-16 DIAGNOSIS — H538 Other visual disturbances: Secondary | ICD-10-CM | POA: Diagnosis not present

## 2017-11-16 DIAGNOSIS — M791 Myalgia, unspecified site: Secondary | ICD-10-CM | POA: Diagnosis not present

## 2017-11-16 LAB — CBC WITH DIFFERENTIAL (CANCER CENTER ONLY)
Basophils Absolute: 0 10*3/uL (ref 0.0–0.1)
Basophils Relative: 0 %
Eosinophils Absolute: 0 10*3/uL (ref 0.0–0.5)
Eosinophils Relative: 0 %
HEMATOCRIT: 33.8 % — AB (ref 38.7–49.9)
Hemoglobin: 11.1 g/dL — ABNORMAL LOW (ref 13.0–17.1)
LYMPHS ABS: 0.6 10*3/uL — AB (ref 0.9–3.3)
LYMPHS PCT: 6 %
MCH: 31.7 pg (ref 28.0–33.4)
MCHC: 32.8 g/dL (ref 32.0–35.9)
MCV: 96.6 fL (ref 82.0–98.0)
MONOS PCT: 4 %
Monocytes Absolute: 0.4 10*3/uL (ref 0.1–0.9)
NEUTROS ABS: 7.9 10*3/uL — AB (ref 1.5–6.5)
Neutrophils Relative %: 90 %
Platelet Count: 332 10*3/uL (ref 145–400)
RBC: 3.5 MIL/uL — ABNORMAL LOW (ref 4.20–5.70)
RDW: 17 % — ABNORMAL HIGH (ref 11.1–15.7)
WBC Count: 8.9 10*3/uL (ref 4.0–10.0)

## 2017-11-16 LAB — CMP (CANCER CENTER ONLY)
ALBUMIN: 2.8 g/dL — AB (ref 3.5–5.0)
ALK PHOS: 66 U/L (ref 26–84)
ALT: 12 U/L (ref 10–47)
AST: 17 U/L (ref 11–38)
Anion gap: 13 (ref 5–15)
BILIRUBIN TOTAL: 0.6 mg/dL (ref 0.2–1.6)
BUN: 21 mg/dL (ref 7–22)
CO2: 25 mmol/L (ref 18–33)
CREATININE: 0.9 mg/dL (ref 0.60–1.20)
Calcium: 9.3 mg/dL (ref 8.0–10.3)
Chloride: 105 mmol/L (ref 98–108)
Glucose, Bld: 218 mg/dL — ABNORMAL HIGH (ref 73–118)
Potassium: 3.7 mmol/L (ref 3.3–4.7)
Sodium: 143 mmol/L (ref 128–145)
Total Protein: 7.2 g/dL (ref 6.4–8.1)

## 2017-11-16 MED ORDER — APALUTAMIDE 60 MG PO TABS: 240.0000 mg | ORAL_TABLET | Freq: Every day | ORAL | 3 refills | Status: DC

## 2017-11-16 MED ORDER — SODIUM CHLORIDE 0.9% FLUSH
10.0000 mL | INTRAVENOUS | Status: AC | PRN
  Administered 2017-11-16: 10 mL via INTRAVENOUS
  Filled 2017-11-16: qty 10

## 2017-11-16 MED ORDER — PREDNISONE 10 MG PO TABS: 10.0000 mg | ORAL_TABLET | Freq: Every day | ORAL | 3 refills | Status: DC

## 2017-11-16 MED ORDER — HEPARIN SOD (PORK) LOCK FLUSH 100 UNIT/ML IV SOLN
500.0000 [IU] | Freq: Once | INTRAVENOUS | Status: AC
  Administered 2017-11-16: 500 [IU] via INTRAVENOUS
  Filled 2017-11-16: qty 5

## 2017-11-16 NOTE — Addendum Note (Signed)
Addended by: [place] More[place] on: 11/16/2017 01:54 PM   Modules accepted: Orders, SmartSet

## 2017-11-16 NOTE — Progress Notes (Signed)
Hematology and Oncology Follow Up Visit  Alexander Ellis [id number] 07-08-1945 72 y.o. 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  We got him off the Taxotere.  He just was not tolerating it.  His PSA was also going up.  His last PSA was 235.  We did go ahead and transfuse him 2 units of blood.  I thought this would make him feel better.  It really has not.  I have to suspect that is his underlying malignancy that is causing him not to feel all that well.  He told me a story in which he was seen by his eye doctor for cataract.  Apparently, there was an issue in which the doctor put a needle in to Alexander Ellis eye.  This was very painful.  Alexander Ellis apparently had a reflexive reaction that the doctor did not appreciate.  I really do not think that he is capable of taking any chemotherapy at this point.  I think one option might be to use apalutamide.  This is 1 of the new anti-androgens.  Again I think this might be reasonable.  His oral.  He takes 240 mg a day.  I will give him 10 mg of prednisone to take along with this.  He has had no bleeding.  He has had no fever.  He has had no cough.  He has had no diarrhea.  Overall, his performance status is ECOG 2-3.  Medications:  Allergies as of 11/16/2017      Reactions   Oxycodone Other (See Comments)   somnolence Sleep all the time   Penicillins Hives, Rash   Codeine Nausea And Vomiting   Other Nausea Only, Other (See Comments)   Uncoded Allergy. Allergen: IV contrast   Hydrocodone-acetaminophen Nausea And Vomiting      Medication List        Accurate as of 11/16/17  1:04 PM. Always use your most recent med list.          AZOPT 1 % ophthalmic suspension Generic drug:   brinzolamide INSTILL 1 DROP TWICE A DAY INTO both eyes-reported 10/26/17   COMBIGAN 0.2-0.5 % ophthalmic solution Generic drug:  brimonidine-timolol PLACE 1 DROP IN EACH EYE TWICE (2) DAILY   dexamethasone 4 MG tablet Commonly known as:  DECADRON Take 2 tablets (8 mg total) by mouth 2 (two) times daily. Start the day before Taxotere. Then daily after chemo for 2 days.   lidocaine-prilocaine cream Commonly known as:  EMLA Apply 1 application topically as needed. Apply to port 1-2 hours before coming for treatment.  1/2 tbsp applied but do not rub in.  Place plastic covering over cream.   lidocaine-prilocaine cream Commonly known as:  EMLA Apply to affected area once   loratadine 10 MG tablet Commonly known as:  CLARITIN Take 10 mg by mouth daily. Daily x days after Neulasta.   LORazepam 0.5 MG tablet Commonly known as:  ATIVAN Take 1 tablet (0.5 mg total) by mouth every 6 (six) hours as needed (Nausea or vomiting).   milk thistle 175 MG tablet Take 175 mg by mouth daily.   ondansetron 8 MG tablet Commonly known as:  ZOFRAN Take 1 tablet (8  mg total) by mouth 2 (two) times daily as needed for refractory nausea / vomiting.   OVER THE COUNTER MEDICATION Take 1 capsule by mouth 2 (two) times daily.   prochlorperazine 10 MG tablet Commonly known as:  COMPAZINE Take 1 tablet (10 mg total) by mouth every 6 (six) hours as needed (Nausea or vomiting).   ranitidine 300 MG tablet Commonly known as:  ZANTAC Take by mouth daily as needed.   traMADol 50 MG tablet Commonly known as:  ULTRAM Take 1 tablet (50 mg total) by mouth every 6 (six) hours as needed.   vitamin B-12 100 MCG tablet Commonly known as:  CYANOCOBALAMIN Take 100 mcg by mouth daily.   Vitamin D-3 1000 units Caps Take 1 capsule by mouth daily.       Allergies:  Allergies  Allergen Reactions  . Oxycodone Other (See Comments)    somnolence Sleep all the time  . Penicillins Hives and Rash  . Codeine Nausea  And Vomiting  . Other Nausea Only and Other (See Comments)    Uncoded Allergy. Allergen: IV contrast  . Hydrocodone-Acetaminophen Nausea And Vomiting    Past Medical History, Surgical history, Social history, and Family History were reviewed and updated.  Review of Systems: Review of Systems  Constitutional: Positive for malaise/fatigue.  HENT: Negative.   Eyes: Positive for blurred vision.  Respiratory: Negative.   Cardiovascular: Negative.   Gastrointestinal: Negative.   Genitourinary: Negative.   Musculoskeletal: Positive for myalgias.  Skin: Negative.   Neurological: Positive for weakness.  Endo/Heme/Allergies: Negative.   Psychiatric/Behavioral: Negative.      Physical Exam:  weight is 134 lb (60.8 kg). His oral temperature is 97.8 F (36.6 C). His blood pressure is 101/63 and his pulse is 73. His respiration is 20 and oxygen saturation is 95%.   Wt Readings from Last 3 Encounters:  11/16/17 134 lb (60.8 kg)  10/26/17 135 lb (61.2 kg)  10/24/17 143 lb (64.9 kg)    Physical Exam  Constitutional: He is oriented to person, place, and time.  HENT:  Head: Normocephalic and atraumatic.  Mouth/Throat: Oropharynx is clear and moist.  Eyes: Pupils are equal, round, and reactive to light. EOM are normal.  Neck: Normal range of motion.  Cardiovascular: Normal rate, regular rhythm and normal heart sounds.  Pulmonary/Chest: Effort normal and breath sounds normal.  Abdominal: Soft. Bowel sounds are normal.  Musculoskeletal: Normal range of motion. He exhibits no edema, tenderness or deformity.  Lymphadenopathy:    He has no cervical adenopathy.  Neurological: He is alert and oriented to person, place, and time.  Skin: Skin is warm and dry. No rash noted. No erythema.  Psychiatric: He has a normal mood and affect. His behavior is normal. Judgment and thought content normal.  Vitals reviewed.    Lab Results  Component Value Date   WBC 8.9 11/16/2017   HGB 11.1 (L)  11/16/2017   HCT 33.8 (L) 11/16/2017   MCV 96.6 11/16/2017   PLT 332 11/16/2017   Lab Results  Component Value Date   FERRITIN 1,015 (H) 10/05/2017   IRON 86 10/05/2017   TIBC 256 10/05/2017   UIBC 170 10/05/2017   IRONPCTSAT 34 (L) 10/05/2017   Lab Results  Component Value Date   RETICCTPCT 1.1 01/20/2015   RBC 3.50 (L) 11/16/2017   RETICCTABS 50.2 01/20/2015   No results found for: KPAFRELGTCHN, LAMBDASER, KAPLAMBRATIO No results found for: IGGSERUM, IGA, IGMSERUM No results found for: TOTALPROTELP, ALBUMINELP, A1GS, A2GS, BETS, BETA2SER, GAMS,  MSPIKE, SPEI   Chemistry      Component Value Date/Time   NA 139 10/26/2017 1115   NA 147 (H) 06/06/2015 1156   K 3.9 10/26/2017 1115   K 5.4 (H) 06/06/2015 1156   CL 106 10/26/2017 1115   CL 106 06/06/2015 1156   CO2 25 10/26/2017 1115   CO2 26 06/06/2015 1156   BUN 20 10/26/2017 1115   BUN 18 06/06/2015 1156   CREATININE 1.10 10/26/2017 1115   CREATININE 1.1 06/06/2015 1156      Component Value Date/Time   CALCIUM 9.0 10/26/2017 1115   CALCIUM 9.6 06/06/2015 1156   ALKPHOS 64 10/26/2017 1115   ALKPHOS 42 06/06/2015 1156   AST 19 10/26/2017 1115   ALT 17 10/26/2017 1115   ALT 13 06/06/2015 1156   BILITOT 0.6 10/26/2017 1115      Impression and Plan: Mr. Seitzinger is a very pleasant 72 yo caucasian gentleman with progressive castrate resistant prostate cancer.   Hopefully, the apalutamide will help.  I know that this is a "long shot" for him.  I want quality of life to be a primary motivating factor here.  I would like to see him back in 3 weeks.  If he declines any further, we probably will have to think about hospice.     Volanda Napoleon, MD 6/26/20191:04 PM

## 2017-11-16 NOTE — Telephone Encounter (Signed)
Oral Oncology Pharmacist Encounter  Received new prescription for Erleada (apalutamide) for the treatment of metastatic castrate resistant prostate cancer, planned duration until disease progression or unacceptable drug toxicity.   Per Care Everywhere, Mr. Bessinger has been previously treated with enzalutamide, abiraterone, and docetaxel.   Prescription dose and frequency assessed.   Current medication list in Epic reviewed, DDIs with Gillermina Phy identified: - Xtandi may decrease the serum concentrations of ondansetron, prednisone, ranitidine, tramadol. Mr. Picotte should be monitored for decreased effectiveness of the listed medications.  Prescription has been e-scribed to the [facility] for benefits analysis and approval.  Oral Oncology Clinic will continue to follow for insurance authorization, copayment issues, initial counseling and start date.  Darl Pikes, PharmD, BCPS Hematology/Oncology Clinical Pharmacist [facility] [place] [facility] [phone]  11/16/2017 4:22 PM

## 2017-11-17 ENCOUNTER — Other Ambulatory Visit: Payer: Self-pay | Admitting: Family

## 2017-11-17 ENCOUNTER — Telehealth: Payer: Self-pay | Admitting: *Deleted

## 2017-11-17 DIAGNOSIS — D509 Iron deficiency anemia, unspecified: Secondary | ICD-10-CM | POA: Insufficient documentation

## 2017-11-17 DIAGNOSIS — D508 Other iron deficiency anemias: Secondary | ICD-10-CM

## 2017-11-17 LAB — IRON AND TIBC
IRON: 42 ug/dL (ref 42–163)
SATURATION RATIOS: 20 % — AB (ref 42–163)
TIBC: 215 ug/dL (ref 202–409)
UIBC: 173 ug/dL

## 2017-11-17 LAB — PROSTATE-SPECIFIC AG, SERUM (LABCORP): PROSTATE SPECIFIC AG, SERUM: 211.8 ng/mL — AB (ref 0.0–4.0)

## 2017-11-17 LAB — FERRITIN: Ferritin: 2139 ng/mL — ABNORMAL HIGH (ref 24–336)

## 2017-11-17 NOTE — Telephone Encounter (Addendum)
Patient is aware of results  ----- Message from Volanda Napoleon, MD sent at 11/17/2017  6:32 AM EDT ----- Call - the PSA is actually a little lower!!  It is 211 -- was 235.  pete

## 2017-11-18 ENCOUNTER — Telehealth: Payer: Self-pay | Admitting: Pharmacy Technician

## 2017-11-18 ENCOUNTER — Ambulatory Visit: Payer: Medicare Other

## 2017-11-18 MED ORDER — PREDNISONE 10 MG PO TABS: 10.0000 mg | ORAL_TABLET | Freq: Every day | ORAL | 2 refills | Status: DC

## 2017-11-18 MED FILL — ERLEADA 60 MG TABS: 60 | 30 days supply | Qty: 120 | Fill #0

## 2017-11-18 NOTE — Telephone Encounter (Signed)
Oral Chemotherapy Pharmacist Encounter  Patient Education I spoke with patient's wife for overview of new oral chemotherapy medication: Erleada (apalutamide) for the treatment of metastatic castrate resistant prostate cancer, planned duration until disease progression or unacceptable drug toxicity.   Pt is doing well. Counseled patient's wife on administration, dosing, side effects, monitoring, drug-food interactions, safe handling, storage, and disposal. Patient will take 4 tablets (240 mg total) by mouth daily. May be taken with or without food.  Side effects include but not limited to: fatigue, decreased hgb/wbc.    Reviewed with patient importance of keeping a medication schedule and plan for any missed doses.  Mrs. [name] voiced understanding and appreciation. All questions answered. Medication handout placed in the mail.  Provided patient with [facility] phone number. Patient knows to call the office with questions or concerns. Oral Chemotherapy Navigation Clinic will continue to follow.  Darl Pikes, PharmD, BCPS, [facility] Hematology/Oncology Clinical Pharmacist [facility] [place] [facility] [phone]  11/18/2017 3:37 PM

## 2017-11-18 NOTE — Telephone Encounter (Signed)
Oral Oncology Patient Advocate Encounter  Spoke with patient's wife, [name], to set up delivery of [place] to Mr Bearce.  Medication will be mailed out Monday 11/21/17 for delivery 11/22/17.  [place] Patient [place] Phone [phone] Fax [phone] 11/18/2017 3:45 PM

## 2017-11-18 NOTE — Addendum Note (Signed)
Addended by: Darl Pikes on: 11/18/2017 03:48 PM   Modules accepted: Orders

## 2017-11-18 NOTE — Telephone Encounter (Signed)
Oral Oncology Patient Advocate Encounter  Was successful in securing patient an $41 grant from Patient [place] [facility]) to provide copayment coverage for his Erleada.  This will keep the out of pocket expense at $0.    I have spoken with the patient.    The billing information is as follows and has been shared with [place].   Member ID: [id number] Group ID: [id number] RxBin: [id number] Dates of Eligibility: 08/20/17 through 11/18/18  [place] Patient [place] Phone [phone] Fax [phone] 11/18/2017 2:05 PM

## 2017-11-22 ENCOUNTER — Encounter: Payer: Self-pay | Admitting: Hematology & Oncology

## 2017-11-28 ENCOUNTER — Telehealth: Payer: Self-pay | Admitting: *Deleted

## 2017-11-28 NOTE — Telephone Encounter (Signed)
Patient has just started prednisone and Erleada. He wants to make sure he can follow through with plans for cataract surgery in a few months with this new treatment.  Spoke with Dr Marin Olp and he is okay with patient planning surgery while on this treatment.   Patient aware of Dr Antonieta Pert agreement. He is also advised to let Dr Marin Olp know when he is scheduled for surgery in case of any needed modification.

## 2017-11-30 ENCOUNTER — Telehealth: Payer: Self-pay

## 2017-11-30 NOTE — Telephone Encounter (Signed)
Nutrition  Patient identified on Malnutrition screening report with weight loss and poor appetite.    Called patient on home phone and left message to return RD's call.  Taia Bramlett B. Zenia Resides, [place], [place] Registered Dietitian [phone] (pager)

## 2017-12-06 ENCOUNTER — Telehealth: Payer: Self-pay

## 2017-12-06 NOTE — Telephone Encounter (Signed)
Nutrition Assessment:  Patient identified on Malnutrition screening report for weight loss and poor appetite.    Patient with metastatic prostate cancer followed by  Dr Marin Olp    Wife returned RD call.  Spoke with wife this am.  Patient with poor appetite for months per wife.  Reports recently teeth have become loose and painful to chew foods, even soft foods.  Wife reports will be having 7 teeth pulled on Thursday of this week.    Wife reports patient is able to eat sausage biscuit mosts morning but it hurts.  Lunch is a banana sandwich or tomato sandwich, dinner is much the same.  Wife reports has been drinking carnation instant breakfast and mixing with whole milk, boost high protein.  Wife reports "he eats like a bird."     Medications: reviewed  Labs: reviewed  Anthropometrics:   Height: 69 inches Weight: 134 lb Noted 140 lb in 07/26/17.  Noted weight 06/06/15 169 lb BMI: 10 4% weight loss in the last 3 months  NUTRITION DIAGNOSIS: Inadequate oral intake related to loose teeth, painful chewing, side effects from treatment as evidenced by 4% weight loss in the last 3 months and poor appetite   INTERVENTION:  Discussed strategies to help increase calories and protein. Will mail fact sheet.   Discussed soft moist protein foods, easier to chew foods.  Will mail fact sheet as well. Encouraged wife to have patient try higher calorie oral nutrition supplements shakes.  Recommend liberalizing diet at this time vs restricting foods patient can eat (wife concerned about blood glucose).  Would encourage controlling blood glucose with medications vs limiting patient intake due to poor po intake and patient limited with soft foods (ie ice cream, pudding, mashed potatoes, etc).   Will mail coupons for supplement and contact information.      MONITORING, EVALUATION, GOAL: Patient will consume adequate calories and protein to maintain weight   NEXT VISIT: wife to contact RD as needed  Alexander Ellis  B. Zenia Resides, [place], [place] Registered Dietitian [phone] (pager)

## 2017-12-07 ENCOUNTER — Inpatient Hospital Stay: Payer: Medicare Other | Attending: Hematology & Oncology

## 2017-12-07 ENCOUNTER — Inpatient Hospital Stay: Payer: Medicare Other

## 2017-12-07 ENCOUNTER — Inpatient Hospital Stay (HOSPITAL_BASED_OUTPATIENT_CLINIC_OR_DEPARTMENT_OTHER): Payer: Medicare Other | Admitting: Hematology & Oncology

## 2017-12-07 VITALS — BP 92/61

## 2017-12-07 VITALS — BP 95/69 | HR 77 | Temp 97.6°F | Resp 19 | Wt 132.5 lb

## 2017-12-07 DIAGNOSIS — Z95828 Presence of other vascular implants and grafts: Secondary | ICD-10-CM

## 2017-12-07 DIAGNOSIS — R530 Neoplastic (malignant) related fatigue: Secondary | ICD-10-CM | POA: Insufficient documentation

## 2017-12-07 DIAGNOSIS — Z79899 Other long term (current) drug therapy: Secondary | ICD-10-CM | POA: Insufficient documentation

## 2017-12-07 DIAGNOSIS — H538 Other visual disturbances: Secondary | ICD-10-CM | POA: Diagnosis not present

## 2017-12-07 DIAGNOSIS — C61 Malignant neoplasm of prostate: Secondary | ICD-10-CM

## 2017-12-07 DIAGNOSIS — M791 Myalgia, unspecified site: Secondary | ICD-10-CM | POA: Diagnosis not present

## 2017-12-07 DIAGNOSIS — R531 Weakness: Secondary | ICD-10-CM | POA: Insufficient documentation

## 2017-12-07 DIAGNOSIS — D500 Iron deficiency anemia secondary to blood loss (chronic): Secondary | ICD-10-CM | POA: Insufficient documentation

## 2017-12-07 DIAGNOSIS — C7951 Secondary malignant neoplasm of bone: Secondary | ICD-10-CM | POA: Diagnosis present

## 2017-12-07 DIAGNOSIS — D508 Other iron deficiency anemias: Secondary | ICD-10-CM

## 2017-12-07 LAB — CBC WITH DIFFERENTIAL (CANCER CENTER ONLY)
BASOS ABS: 0 10*3/uL (ref 0.0–0.1)
BASOS PCT: 0 %
Eosinophils Absolute: 0.1 10*3/uL (ref 0.0–0.5)
Eosinophils Relative: 2 %
HEMATOCRIT: 34.3 % — AB (ref 38.7–49.9)
HEMOGLOBIN: 11.2 g/dL — AB (ref 13.0–17.1)
Lymphocytes Relative: 16 %
Lymphs Abs: 1.2 10*3/uL (ref 0.9–3.3)
MCH: 31.5 pg (ref 28.0–33.4)
MCHC: 32.7 g/dL (ref 32.0–35.9)
MCV: 96.6 fL (ref 82.0–98.0)
Monocytes Absolute: 0.6 10*3/uL (ref 0.1–0.9)
Monocytes Relative: 9 %
NEUTROS ABS: 5.1 10*3/uL (ref 1.5–6.5)
Neutrophils Relative %: 73 %
Platelet Count: 273 10*3/uL (ref 145–400)
RBC: 3.55 MIL/uL — AB (ref 4.20–5.70)
RDW: 17.9 % — AB (ref 11.1–15.7)
WBC Count: 7 10*3/uL (ref 4.0–10.0)

## 2017-12-07 LAB — CMP (CANCER CENTER ONLY)
ALBUMIN: 3.1 g/dL — AB (ref 3.5–5.0)
ALT: 14 U/L (ref 10–47)
AST: 15 U/L (ref 11–38)
Alkaline Phosphatase: 63 U/L (ref 26–84)
Anion gap: 5 (ref 5–15)
BILIRUBIN TOTAL: 0.7 mg/dL (ref 0.2–1.6)
BUN: 17 mg/dL (ref 7–22)
CALCIUM: 9.5 mg/dL (ref 8.0–10.3)
CO2: 29 mmol/L (ref 18–33)
CREATININE: 1.1 mg/dL (ref 0.60–1.20)
Chloride: 104 mmol/L (ref 98–108)
Glucose, Bld: 108 mg/dL (ref 73–118)
Potassium: 4.4 mmol/L (ref 3.3–4.7)
Sodium: 138 mmol/L (ref 128–145)
Total Protein: 7.1 g/dL (ref 6.4–8.1)

## 2017-12-07 MED ORDER — SODIUM CHLORIDE 0.9% FLUSH
10.0000 mL | INTRAVENOUS | Status: DC | PRN
  Administered 2017-12-07: 10 mL via INTRAVENOUS
  Filled 2017-12-07: qty 10

## 2017-12-07 MED ORDER — SODIUM CHLORIDE 0.9 % IV SOLN
Freq: Once | INTRAVENOUS | Status: AC
  Administered 2017-12-07: 10:00:00 via INTRAVENOUS

## 2017-12-07 MED ORDER — FERUMOXYTOL INJECTION 510 MG/17 ML
510.0000 mg | Freq: Once | INTRAVENOUS | Status: AC
  Administered 2017-12-07: 510 mg via INTRAVENOUS
  Filled 2017-12-07: qty 17

## 2017-12-07 MED ORDER — HEPARIN SOD (PORK) LOCK FLUSH 100 UNIT/ML IV SOLN
500.0000 [IU] | Freq: Once | INTRAVENOUS | Status: AC
  Administered 2017-12-07: 500 [IU] via INTRAVENOUS
  Filled 2017-12-07: qty 5

## 2017-12-07 NOTE — Progress Notes (Signed)
Hematology and Oncology Follow Up Visit  Alexander Ellis [id number] 1946-04-19 72 y.o. 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  He is doing well with the apalutamide.  His last PSA was 611.  This actually was a little bit better.  Her graph he is iron deficient.  He has had some issues with low-grade GI bleeding.  He also has issues with malabsorption of iron.  Wound last saw him 3 weeks ago, his ferritin was quite high at 2140 mg but this was a acute phase reactant level.  His iron saturation was only 20%.  His serum iron was 42.  He feels very tired.  He gets worn out easily.  As such, I think IV iron will help him.  He is not having pain.  He is not having any chest wall discomfort.  He has had no change in bowel or bladder habits.  He has had no leg swelling.  He had no diarrhea.   Overall, his performance status is ECOG 2-3.  Medications:  Allergies as of 12/07/2017      Reactions   Oxycodone Other (See Comments)   somnolence Sleep all the time   Penicillins Hives, Rash   Codeine Nausea And Vomiting   Other Nausea Only, Other (See Comments)   Uncoded Allergy. Allergen: IV contrast   Hydrocodone-acetaminophen Nausea And Vomiting      Medication List        Accurate as of 12/07/17  9:58 AM. Always use your most recent med list.          apalutamide 60 MG tablet Commonly known as:  ERLEADA Take 4 tablets (240 mg total) by mouth daily. May be taken with or without food. Swallow tablets whole.   AZOPT 1 % ophthalmic suspension Generic drug:  brinzolamide INSTILL 1 DROP TWICE A DAY INTO both eyes-reported 10/26/17     COMBIGAN 0.2-0.5 % ophthalmic solution Generic drug:  brimonidine-timolol PLACE 1 DROP IN EACH EYE TWICE (2) DAILY   lidocaine-prilocaine cream Commonly known as:  EMLA Apply 1 application topically as needed. Apply to port 1-2 hours before coming for treatment.  1/2 tbsp applied but do not rub in.  Place plastic covering over cream.   lidocaine-prilocaine cream Commonly known as:  EMLA Apply to affected area once   loratadine 10 MG tablet Commonly known as:  CLARITIN Take 10 mg by mouth daily. Daily x days after Neulasta.   LORazepam 0.5 MG tablet Commonly known as:  ATIVAN Take 1 tablet (0.5 mg total) by mouth every 6 (six) hours as needed (Nausea or vomiting).   milk thistle 175 MG tablet Take 175 mg by mouth daily.   ondansetron 8 MG tablet Commonly known as:  ZOFRAN Take 1 tablet (8 mg total) by mouth 2 (two) times daily as needed for refractory nausea / vomiting.   OVER THE COUNTER MEDICATION Take 1 capsule by mouth 2 (two) times daily.   predniSONE 10 MG tablet Commonly known as:  DELTASONE Take 1 tablet (10 mg total) by mouth daily with breakfast.   prochlorperazine 10 MG tablet Commonly known as:  COMPAZINE Take 1 tablet (10 mg total) by mouth every 6 (six) hours as needed (Nausea or vomiting).   ranitidine 300 MG tablet Commonly known as:  ZANTAC Take by mouth daily as needed.   traMADol 50 MG tablet Commonly known as:  ULTRAM Take 1 tablet (50 mg total) by mouth every 6 (six) hours as needed.   vitamin B-12 100 MCG tablet Commonly known as:  CYANOCOBALAMIN Take 100 mcg by mouth daily.   Vitamin D-3 1000 units Caps Take 1 capsule by mouth daily.       Allergies:  Allergies  Allergen Reactions  . Oxycodone Other (See Comments)    somnolence Sleep all the time  . Penicillins Hives and Rash  . Codeine Nausea And Vomiting  . Other Nausea Only and Other (See Comments)    Uncoded Allergy. Allergen: IV contrast  . Hydrocodone-Acetaminophen Nausea  And Vomiting    Past Medical History, Surgical history, Social history, and Family History were reviewed and updated.  Review of Systems: Review of Systems  Constitutional: Positive for malaise/fatigue.  HENT: Negative.   Eyes: Positive for blurred vision.  Respiratory: Negative.   Cardiovascular: Negative.   Gastrointestinal: Negative.   Genitourinary: Negative.   Musculoskeletal: Positive for myalgias.  Skin: Negative.   Neurological: Positive for weakness.  Endo/Heme/Allergies: Negative.   Psychiatric/Behavioral: Negative.      Physical Exam:  weight is 132 lb 8 oz (60.1 kg). His oral temperature is 97.6 F (36.4 C). His blood pressure is 95/69 and his pulse is 77. His respiration is 19 and oxygen saturation is 100%.   Wt Readings from Last 3 Encounters:  12/07/17 132 lb 8 oz (60.1 kg)  11/16/17 134 lb (60.8 kg)  10/26/17 135 lb (61.2 kg)    Physical Exam  Constitutional: He is oriented to person, place, and time.  HENT:  Head: Normocephalic and atraumatic.  Mouth/Throat: Oropharynx is clear and moist.  Eyes: Pupils are equal, round, and reactive to light. EOM are normal.  Neck: Normal range of motion.  Cardiovascular: Normal rate, regular rhythm and normal heart sounds.  Pulmonary/Chest: Effort normal and breath sounds normal.  Abdominal: Soft. Bowel sounds are normal.  Musculoskeletal: Normal range of motion. He exhibits no edema, tenderness or deformity.  Lymphadenopathy:    He has no cervical adenopathy.  Neurological: He is alert and oriented to person, place, and time.  Skin: Skin is warm and dry. No rash noted. No erythema.  Psychiatric: He has a normal mood and affect. His behavior is normal. Judgment and thought content normal.  Vitals reviewed.    Lab Results  Component Value Date   WBC 7.0 12/07/2017   HGB 11.2 (L) 12/07/2017   HCT 34.3 (L) 12/07/2017   MCV 96.6 12/07/2017   PLT 273 12/07/2017   Lab Results  Component Value Date   FERRITIN  2,139 (H) 11/16/2017   IRON 42 11/16/2017   TIBC 215 11/16/2017   UIBC 173 11/16/2017   IRONPCTSAT 20 (L) 11/16/2017   Lab Results  Component Value Date   RETICCTPCT 1.1 01/20/2015   RBC 3.55 (L) 12/07/2017   RETICCTABS 50.2 01/20/2015   No results found for: KPAFRELGTCHN, LAMBDASER, KAPLAMBRATIO No results found for: IGGSERUM, IGA, IGMSERUM No results found for: TOTALPROTELP, ALBUMINELP, A1GS, A2GS, BETS, BETA2SER, GAMS, MSPIKE, SPEI   Chemistry      Component Value Date/Time   NA 138 12/07/2017 0830   NA 147 (H) 06/06/2015 1156   K 4.4 12/07/2017 0830   K 5.4 (  H) 06/06/2015 1156   CL 104 12/07/2017 0830   CL 106 06/06/2015 1156   CO2 29 12/07/2017 0830   CO2 26 06/06/2015 1156   BUN 17 12/07/2017 0830   BUN 18 06/06/2015 1156   CREATININE 1.10 12/07/2017 0830   CREATININE 1.1 06/06/2015 1156      Component Value Date/Time   CALCIUM 9.5 12/07/2017 0830   CALCIUM 9.6 06/06/2015 1156   ALKPHOS 63 12/07/2017 0830   ALKPHOS 42 06/06/2015 1156   AST 15 12/07/2017 0830   ALT 14 12/07/2017 0830   ALT 13 06/06/2015 1156   BILITOT 0.7 12/07/2017 0830      Impression and Plan: Mr. Kadlec is a very pleasant 72 yo caucasian gentleman with progressive castrate resistant prostate cancer.   I still not sure how well the apalutamide is going to help.  His PSA will be critical this visit.  Again, this is all about his quality of life that were focused on.  I want to try to improve his quality of life if we can.  We will see him back in another month.Volanda Napoleon, MD 7/17/20199:58 AM

## 2017-12-08 ENCOUNTER — Encounter: Payer: Self-pay | Admitting: *Deleted

## 2017-12-08 LAB — PROSTATE-SPECIFIC AG, SERUM (LABCORP): Prostate Specific Ag, Serum: 179.9 ng/mL — ABNORMAL HIGH (ref 0.0–4.0)

## 2017-12-14 ENCOUNTER — Emergency Department (HOSPITAL_COMMUNITY)
Admission: EM | Admit: 2017-12-14 | Discharge: 2017-12-15 | Disposition: A | Discharge status: Home / Self Care | Payer: Medicare Other | Attending: Emergency Medicine | Admitting: Emergency Medicine

## 2017-12-14 ENCOUNTER — Emergency Department (HOSPITAL_COMMUNITY): Payer: Medicare Other

## 2017-12-14 ENCOUNTER — Encounter (HOSPITAL_COMMUNITY): Payer: Self-pay | Admitting: Emergency Medicine

## 2017-12-14 ENCOUNTER — Other Ambulatory Visit: Payer: Self-pay

## 2017-12-14 DIAGNOSIS — R059 Cough, unspecified: Secondary | ICD-10-CM

## 2017-12-14 DIAGNOSIS — R05 Cough: Secondary | ICD-10-CM

## 2017-12-14 DIAGNOSIS — R0602 Shortness of breath: Secondary | ICD-10-CM | POA: Diagnosis present

## 2017-12-14 DIAGNOSIS — Z79899 Other long term (current) drug therapy: Secondary | ICD-10-CM | POA: Diagnosis not present

## 2017-12-14 DIAGNOSIS — R0600 Dyspnea, unspecified: Secondary | ICD-10-CM | POA: Diagnosis not present

## 2017-12-14 DIAGNOSIS — F17210 Nicotine dependence, cigarettes, uncomplicated: Secondary | ICD-10-CM | POA: Insufficient documentation

## 2017-12-14 DIAGNOSIS — J449 Chronic obstructive pulmonary disease, unspecified: Secondary | ICD-10-CM | POA: Insufficient documentation

## 2017-12-14 LAB — CBC WITH DIFFERENTIAL/PLATELET
Abs Immature Granulocytes: 0.1 10*3/uL (ref 0.0–0.1)
BASOS PCT: 1 %
Basophils Absolute: 0 10*3/uL (ref 0.0–0.1)
EOS PCT: 1 %
Eosinophils Absolute: 0 10*3/uL (ref 0.0–0.7)
HEMATOCRIT: 38.1 % — AB (ref 39.0–52.0)
Hemoglobin: 12 g/dL — ABNORMAL LOW (ref 13.0–17.0)
Immature Granulocytes: 1 %
LYMPHS ABS: 1.7 10*3/uL (ref 0.7–4.0)
Lymphocytes Relative: 22 %
MCH: 31.2 pg (ref 26.0–34.0)
MCHC: 31.5 g/dL (ref 30.0–36.0)
MCV: 99 fL (ref 78.0–100.0)
MONOS PCT: 9 %
Monocytes Absolute: 0.7 10*3/uL (ref 0.1–1.0)
Neutro Abs: 5.3 10*3/uL (ref 1.7–7.7)
Neutrophils Relative %: 66 %
PLATELETS: 300 10*3/uL (ref 150–400)
RBC: 3.85 MIL/uL — AB (ref 4.22–5.81)
RDW: 19 % — AB (ref 11.5–15.5)
WBC: 7.8 10*3/uL (ref 4.0–10.5)

## 2017-12-14 LAB — COMPREHENSIVE METABOLIC PANEL
ALT: 11 U/L (ref 0–44)
ANION GAP: 14 (ref 5–15)
AST: 19 U/L (ref 15–41)
Albumin: 3.3 g/dL — ABNORMAL LOW (ref 3.5–5.0)
Alkaline Phosphatase: 60 U/L (ref 38–126)
BILIRUBIN TOTAL: 0.5 mg/dL (ref 0.3–1.2)
BUN: 14 mg/dL (ref 8–23)
CO2: 23 mmol/L (ref 22–32)
Calcium: 9.6 mg/dL (ref 8.9–10.3)
Chloride: 103 mmol/L (ref 98–111)
Creatinine, Ser: 1.35 mg/dL — ABNORMAL HIGH (ref 0.61–1.24)
GFR calc Af Amer: 58 mL/min — ABNORMAL LOW (ref >60)
GFR, EST NON AFRICAN AMERICAN: 50 mL/min — AB (ref >60)
Glucose, Bld: 127 mg/dL — ABNORMAL HIGH (ref 70–99)
Potassium: 4.8 mmol/L (ref 3.5–5.1)
Sodium: 140 mmol/L (ref 135–145)
TOTAL PROTEIN: 7.4 g/dL (ref 6.5–8.1)

## 2017-12-14 LAB — TROPONIN I: Troponin I: <0.03 ng/mL (ref <0.03)

## 2017-12-14 LAB — BRAIN NATRIURETIC PEPTIDE: B NATRIURETIC PEPTIDE 5: 32.9 pg/mL (ref 0.0–100.0)

## 2017-12-14 MED ORDER — ONDANSETRON HCL 4 MG/2ML IJ SOLN
4.0000 mg | Freq: Once | INTRAMUSCULAR | Status: AC
  Administered 2017-12-14: 4 mg via INTRAVENOUS
  Filled 2017-12-14: qty 2

## 2017-12-14 MED ORDER — IOPAMIDOL (ISOVUE-370) INJECTION 76%
100.0000 mL | Freq: Once | INTRAVENOUS | Status: AC | PRN
  Administered 2017-12-14: 34 mL via INTRAVENOUS

## 2017-12-14 NOTE — ED Provider Notes (Signed)
Patient placed in Quick Look pathway, seen and evaluated   Chief Complaint: shortness of breath   HPI:   Alexander Ellis is a 72 y.o. male who presents to the ED from [facility] Urgent care with c/o shortness of breath for the past week. Assessment at Urgent Care was pleural effusion.   ROS: Resp: shortness of breath  Neuro: dizziness, weakness  Physical Exam:  BP 118/74 (BP Location: Right Arm)   Pulse (!) 104   Temp 97.7 F (36.5 C) (Oral)   Resp 18   Ht 5\' 9"  (1.753 m)   Wt 58.5 kg (129 lb)   SpO2 98%   BMI 19.05 kg/m    Gen: frail appearing with shallow breaths  Neuro: Awake and Alert  Heart: tachycardic  Lungs: decreased breath sounds, wheezing and rales lower lungs.     Initiation of care has begun. The patient has been counseled on the process, plan, and necessity for staying for the completion/evaluation, and the remainder of the medical screening examination    Ashley Murrain, NP 12/14/17 Allayne Stack, MD 12/14/17 2023

## 2017-12-14 NOTE — ED Triage Notes (Signed)
Patient reports worsening SOB for several days worse with exertion , advised by his PCP to go to ER for further evaluation / treatment . Chest x-ray done at PCP clinic today . Denies fever or chills .

## 2017-12-14 NOTE — ED Provider Notes (Signed)
[place] EMERGENCY DEPARTMENT Provider Note   CSN: [id number] Arrival date & time: 12/14/17  1921     History   Chief Complaint Chief Complaint  Patient presents with  . Shortness of Breath    HPI Alexander Ellis is a 72 y.o. male.  HPI Pt started having shortness of breath over a week ago.  His symptoms have been getting worse and today it is severe.  He feels weak and gets short of breath with any activity including talking.  No cough.  No fevers.  No leg swelling.  No history of prior breathing issues per the patient.    Past Medical History:  Diagnosis Date  . Anemia   . Aneurysm of abdominal aorta (HCC)   . Bone metastasis ([place])   . Cancer [facility]) 2014   prostate  . Chronic hepatitis B virus infection ([place])   . Cirrhosis ([place])   . COPD (chronic obstructive pulmonary disease) ([place])   . Dyspnea   . GERD (gastroesophageal reflux disease)   . GERD (gastroesophageal reflux disease)   . Hand pain   . Hepatitis   . Hypotension   . Lung nodules   . Osteoarthritis     Patient Active Problem List   Diagnosis Date Noted  . IDA (iron deficiency anemia) 11/17/2017  . Carotid stenosis 07/26/2017  . 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 Hx of prostatectomy 07/16/2014  . Tobacco use disorder 07/16/2014  . Counseling regarding goals of care 07/16/2014  . Constipation 06/04/2014  . AAA (abdominal aortic aneurysm) ([place]) 10/12/2012  . Chronic hepatitis B ([place]) 10/12/2012    Past Surgical History:  Procedure Laterality Date  . APPENDECTOMY    . BACK SURGERY     [phone]  . IR FLUORO GUIDE PORT INSERTION RIGHT  09/06/2017  . IR US GUIDE VASC ACCESS RIGHT  09/06/2017  . PROSTATECTOMY  2012  . THROAT SURGERY    . TONSILLECTOMY          Home Medications    Prior to Admission medications   Medication Sig Start Date End Date Taking? Authorizing Provider  apalutamide (ERLEADA) 60 MG  tablet Take 4 tablets (240 mg total) by mouth daily. May be taken with or without food. Swallow tablets whole. 11/16/17   Volanda Napoleon, MD  AZOPT 1 % ophthalmic suspension INSTILL 1 DROP TWICE A DAY INTO both eyes-reported 10/26/17 08/11/17   [provider]  Cholecalciferol (VITAMIN D-3) 1000 UNITS CAPS Take 1 capsule by mouth daily.    [provider]  COMBIGAN 0.2-0.5 % ophthalmic solution PLACE 1 DROP IN EACH EYE TWICE (2) DAILY 08/13/17   [provider]  lidocaine-prilocaine (EMLA) cream Apply 1 application topically as needed. Apply to port 1-2 hours before coming for treatment.  1/2 tbsp applied but do not rub in.  Place plastic covering over cream. 09/08/17   Volanda Napoleon, MD  loratadine (CLARITIN) 10 MG tablet Take 10 mg by mouth daily. Daily x days after Neulasta.    [provider]  milk thistle 175 MG tablet Take 175 mg by mouth daily.    [provider]  OVER THE COUNTER MEDICATION Take 1 capsule by mouth 2 (two) times daily.     [provider]  predniSONE (DELTASONE) 10 MG tablet Take 1 tablet (10 mg total) by mouth daily with breakfast. 11/18/17   Volanda Napoleon, MD  ranitidine (ZANTAC) 300 MG tablet  Take by mouth daily as needed.  06/01/17   [provider]  traMADol (ULTRAM) 50 MG tablet Take 1 tablet (50 mg total) by mouth every 6 (six) hours as needed. 11/03/17   Volanda Napoleon, MD  vitamin B-12 (CYANOCOBALAMIN) 100 MCG tablet Take 100 mcg by mouth daily.    [provider]  prochlorperazine (COMPAZINE) 10 MG tablet Take 1 tablet (10 mg total) by mouth every 6 (six) hours as needed (Nausea or vomiting). 10/05/17 12/07/17  Volanda Napoleon, MD    Family History Family History  Problem Relation Age of Onset  . Heart disease Mother   . Hypertension Mother     Social History Social History   Tobacco Use  . Smoking status: Current Every Day Smoker    Packs/day: 0.50    Years: 60.00    Pack years:  30.00    Types: Cigarettes  . Smokeless tobacco: Never Used  . Tobacco comment: pack a day  Substance Use Topics  . Alcohol use: No    Alcohol/week: 0.0 oz  . Drug use: No     Allergies   Oxycodone; Penicillins; Codeine; Other; and Hydrocodone-acetaminophen   Review of Systems Review of Systems  All other systems reviewed and are negative.    Physical Exam Updated Vital Signs BP (!) 142/80   Pulse 89   Temp 97.7 F (36.5 C) (Oral)   Resp (!) 25   Ht 1.753 m (5\' 9" )   Wt 58.5 kg (129 lb)   SpO2 100%   BMI 19.05 kg/m   Physical Exam  Constitutional: He appears well-developed and well-nourished. No distress.  HENT:  Head: Normocephalic and atraumatic.  Right Ear: External ear normal.  Left Ear: External ear normal.  Eyes: Conjunctivae are normal. Right eye exhibits no discharge. Left eye exhibits no discharge. No scleral icterus.  Neck: Neck supple. No tracheal deviation present.  Cardiovascular: Normal rate, regular rhythm and intact distal pulses.  Pulmonary/Chest: No stridor. Tachypnea noted. No respiratory distress. He has no wheezes. He has rales (at the bases).  Abdominal: Soft. Bowel sounds are normal. He exhibits no distension. There is no tenderness. There is no rebound and no guarding.  Musculoskeletal: He exhibits no edema or tenderness.  Neurological: He is alert. He has normal strength. No cranial nerve deficit (no facial droop, extraocular movements intact, no slurred speech) or sensory deficit. He exhibits normal muscle tone. He displays no seizure activity. Coordination normal.  Skin: Skin is warm and dry. No rash noted.  Psychiatric: He has a normal mood and affect.  Nursing note and vitals reviewed.    ED Treatments / Results  Labs (all labs ordered are listed, but only abnormal results are displayed) Labs Reviewed  CBC WITH DIFFERENTIAL/PLATELET - Abnormal; Notable for the following components:      Result Value   RBC 3.85 (*)    Hemoglobin  12.0 (*)    HCT 38.1 (*)    RDW 19.0 (*)    All other components within normal limits  COMPREHENSIVE METABOLIC PANEL - Abnormal; Notable for the following components:   Glucose, Bld 127 (*)    Creatinine, Ser 1.35 (*)    Albumin 3.3 (*)    GFR calc non Af Amer 50 (*)    GFR calc Af Amer 58 (*)    All other components within normal limits  TROPONIN I  BRAIN NATRIURETIC PEPTIDE    EKG None  Radiology No results found.  Procedures Procedures (including critical care  time)  Medications Ordered in ED Medications  ondansetron (ZOFRAN) injection 4 mg (4 mg Intravenous Given 12/14/17 2209)     Initial Impression / Assessment and Plan / ED Course  I have reviewed the triage vital signs and the nursing notes.  Pertinent labs & imaging results that were available during my care of the patient were reviewed by me and considered in my medical decision making (see chart for details).  Clinical Course as of Dec 15 2210  Wed Dec 14, 2017  2022 Outside CXR reviewed.  Increased interstitial markings, possible small pleural effusion, no large effusion noted to account for his dyspnea.   [JK]  2211 Previous records reviewed.  Patient had a CT scan of his chest on May 2.  According to the ED record the patient was sent to the emergency room because of an episode of hypoxia in the doctor's office.  Patient CT scan did show evidence to suggest fibrotic lung disease.   [JK]    Clinical Course User Index [JK] Dorie Rank, MD    Patient presents to the emergency room for evaluation of shortness of breath.  His outside chest x-ray suggests fibrotic lung disease by my interpretation.  Patient's initial labs are unremarkable.  No signs of cardiac ischemia.  BNP is not elevated.  CT scan has been ordered to evaluate for pulmonary embolism.  If negative I suspect the patient's symptoms are most likely related to fibrotic lung disease that has not been evaluated.  PA Marlon Pel will follow up on CT Final  Clinical Impressions(s) / ED Diagnoses   Final diagnoses:  Dyspnea, unspecified type      Dorie Rank, MD 12/14/17 2212

## 2017-12-14 NOTE — ED Notes (Signed)
Patient transported to CT 

## 2017-12-14 NOTE — ED Notes (Signed)
Ambulated pt in hallway on SpO2, pt sats stayed at 100%. Notified Robert(PA)

## 2017-12-15 ENCOUNTER — Ambulatory Visit (INDEPENDENT_AMBULATORY_CARE_PROVIDER_SITE_OTHER): Payer: Medicare Other | Admitting: Internal Medicine

## 2017-12-15 ENCOUNTER — Encounter: Payer: Self-pay | Admitting: Internal Medicine

## 2017-12-15 ENCOUNTER — Telehealth: Payer: Self-pay | Admitting: Pharmacy Technician

## 2017-12-15 VITALS — BP 84/60 | HR 104 | Ht 69.0 in | Wt 127.0 lb

## 2017-12-15 DIAGNOSIS — R0609 Other forms of dyspnea: Secondary | ICD-10-CM | POA: Diagnosis not present

## 2017-12-15 DIAGNOSIS — F17210 Nicotine dependence, cigarettes, uncomplicated: Secondary | ICD-10-CM

## 2017-12-15 DIAGNOSIS — R0600 Dyspnea, unspecified: Secondary | ICD-10-CM | POA: Diagnosis not present

## 2017-12-15 DIAGNOSIS — R918 Other nonspecific abnormal finding of lung field: Secondary | ICD-10-CM

## 2017-12-15 DIAGNOSIS — I6522 Occlusion and stenosis of left carotid artery: Secondary | ICD-10-CM

## 2017-12-15 MED ORDER — LEVOFLOXACIN 750 MG PO TABS
750.0000 mg | ORAL_TABLET | Freq: Once | ORAL | Status: AC
  Administered 2017-12-15: 750 mg via ORAL
  Filled 2017-12-15: qty 1

## 2017-12-15 MED ORDER — LEVOFLOXACIN 750 MG PO TABS: 750.0000 mg | ORAL_TABLET | Freq: Every day | ORAL | 0 refills | Status: DC

## 2017-12-15 NOTE — Progress Notes (Addendum)
Alexander Ellis, male    DOB: Oct 14, 1945, 72 y.o.   MRN: [id number]     Brief patient profile:  31 yowm active smoker with met prostatic ca on rx per Ennever referred to pulmonary clinic 12/15/2017 by ER for sob. Sep 22 2017 showed ILD and breathing downhill x early July 2019 with worse sob/ weakness/ polyuria on apalutamide which suppresses adrenal function.    History of Present Illness  12/15/2017  f/u ov/Mikhala Kenan re:  Chief Complaint  Patient presents with  . Pulmonary Consult    Referred by Dr. Tomi Bamberger. Pt c/o SOB x 2 wks, worse over the past wk. He states he is SOB "all the time" unless he is lying down.    indolent onset progressively worse fatigue/ sob s cough x "a while" (never really able to be sure when actually started) but much worse x 2 weeks prior to OV  s assoc cough / wheeze and has been on prednisone 10 mg daily  With apalutamide since  11/16/17  Previously on Taxotere    Assoc anorexia and polyuria    No obvious day to day or daytime variability or assoc excess/ purulent sputum or mucus plugs or hemoptysis or cp or chest tightness, subjective wheeze or overt sinus or hb symptoms.   Sleeping flat fine  without nocturnal  or early am exacerbation  of respiratory  c/o's or need for noct saba. Also denies any obvious fluctuation of symptoms with weather or environmental changes or other aggravating or alleviating factors except as outlined above   No unusual exposure hx or h/o childhood pna/ asthma or knowledge of premature birth.  Current Allergies, Complete Past Medical History, Past Surgical History, Family History, and Social History were reviewed in [employer] record.  ROS  The following are not active complaints unless bolded Hoarseness, sore throat, dysphagia, dental problems s/p multiple extractions one week prior to OV  , itching, sneezing,  nasal congestion or discharge of excess mucus or purulent secretions, ear ache,   fever, chills, sweats,  unintended wt loss or wt gain, classically pleuritic or exertional cp,  orthopnea pnd or arm/hand swelling  or leg swelling, presyncope, palpitations, abdominal pain, anorexia, nausea, vomiting, diarrhea  or change in bowel habits or change in bladder habits, change in stools or change in urine, dysuria, hematuria,  rash, arthralgias, visual complaints, headache, numbness, weakness or ataxia or problems with walking or coordination,  change in mood or  memory.           Past Medical History:  Diagnosis Date  . Anemia   . Aneurysm of abdominal aorta (HCC)   . Bone metastasis ([place])   . Cancer [facility]) 2014   prostate  . Chronic hepatitis B virus infection ([place])   . Cirrhosis ([place])   . COPD (chronic obstructive pulmonary disease) ([place])   . Dyspnea   . GERD (gastroesophageal reflux disease)   . GERD (gastroesophageal reflux disease)   . Hand pain   . Hepatitis   . Hypotension   . Lung nodules   . Osteoarthritis     Outpatient Medications Prior to Visit  Medication Sig Dispense Refill  . apalutamide (ERLEADA) 60 MG tablet Take 4 tablets (240 mg total) by mouth daily. May be taken with or without food. Swallow tablets whole. 120 tablet 3  . AZOPT 1 % ophthalmic suspension INSTILL 1 DROP TWICE A DAY INTO both eyes-reported 10/26/17  0  . clindamycin (CLEOCIN) 300 MG capsule Take 300 mg  by mouth 3 (three) times daily.  0  . COMBIGAN 0.2-0.5 % ophthalmic solution PLACE 1 DROP IN EACH EYE TWICE (2) DAILY  4  . HYDROcodone-acetaminophen (NORCO) 7.5-325 MG tablet Take 1-2 tablets by mouth every 4 (four) hours as needed. for pain  0  . lidocaine-prilocaine (EMLA) cream Apply 1 application topically as needed. Apply to port 1-2 hours before coming for treatment.  1/2 tbsp applied but do not rub in.  Place plastic covering over cream. 30 g 2  . predniSONE (DELTASONE) 10 MG tablet Take 1 tablet (10 mg total) by mouth daily with breakfast. 30 tablet 2  . Probiotic Product (PROBIOTIC PO) Take 1 capsule by  mouth daily.    . prochlorperazine (COMPAZINE) 10 MG tablet Take 10 mg by mouth every 6 (six) hours as needed for nausea or vomiting.    . ranitidine (ZANTAC) 300 MG tablet Take 300 mg by mouth daily as needed for heartburn.     . traMADol (ULTRAM) 50 MG tablet Take 1 tablet (50 mg total) by mouth every 6 (six) hours as needed. 30 tablet 0  . levofloxacin (LEVAQUIN) 750 MG tablet Take 1 tablet (750 mg total) by mouth daily. (Patient not taking: Reported on 12/15/2017) 7 tablet 0  . magic mouthwash w/lidocaine SOLN Take 5 mLs by mouth 4 (four) times daily.  0   Facility-Administered Medications Prior to Visit  Medication Dose Route Frequency Provider Last Rate Last Dose  . sodium chloride flush (NS) 0.9 % injection 10 mL  10 mL Intravenous PRN Volanda Napoleon, MD   10 mL at 11/16/17 1336            Objective:     BP (!) 84/60 (BP Location: Left Arm, Cuff Size: Normal)   Pulse (!) 104   Ht 5' 9"  (1.753 m)   Wt 127 lb (57.6 kg)   SpO2 99%   BMI 18.75 kg/m   SpO2: 99 %  RA  Wt Readings from Last 3 Encounters:  12/15/17 127 lb (57.6 kg)  12/14/17 129 lb (58.5 kg)  12/07/17 132 lb 8 oz (60.1 kg)        Chronically ill appearing wm mild sob at rest    HEENT: nl   turbinates bilaterally, and oropharynx. Nl external ear canals without cough reflex   NECK :  without JVD/Nodes/TM/ nl carotid upstrokes bilaterally   LUNGS: no acc muscle use,  Nl contour chest with distant bs/ minimal insp crackles s cough on insp or exp    CV:  RRR  no s3 or murmur or increase in P2, and no edema   ABD:  soft and nontender with nl inspiratory excursion in the supine position. No bruits or organomegaly appreciated, bowel sounds nl  MS:  Nl gait/ ext warm without deformities, calf tenderness, cyanosis or clubbing No obvious joint restrictions   SKIN: warm and dry without lesions    NEURO:  alert, approp, nl sensorium with  no motor or cerebellar deficits apparent.     I personally  reviewed images and agree with radiology impression as follows:   Chest CT a  12/14/17 1. No evidence of significant pulmonary embolus. 2. Diffuse lucent and sclerotic metastases throughout the visualized skeleton, similar to previous study. 3. New patchy nodularity in the posterior right upper lung measuring about 11 mm diameter. This could represent infectious or inflammatory process or metastatic disease. Follow-up in 3-6 months is suggested. 4. Diffuse emphysematous changes and interstitial fibrosis in the lungs.  5. Stable appearance of enlarged right hilar and mediastinal lymph nodes and of nodular scarring in the left upper lung. 6. Cholelithiasis. 7. Bilateral adrenal gland nodules, increasing since previous study, possibly metastatic.   Labs ordered/ reviewed:      Chemistry      Component Value Date/Time   NA 140 12/14/2017 2012   NA 147 (H) 06/06/2015 1156   K 4.8 12/14/2017 2012   K 5.4 (H) 06/06/2015 1156   CL 103 12/14/2017 2012   CL 106 06/06/2015 1156   CO2 23 12/14/2017 2012   CO2 26 06/06/2015 1156   BUN 14 12/14/2017 2012   BUN 18 06/06/2015 1156   CREATININE 1.35 (H) 12/14/2017 2012   CREATININE 1.10 12/07/2017 0830   CREATININE 1.1 06/06/2015 1156      Component Value Date/Time   CALCIUM 9.6 12/14/2017 2012   CALCIUM 9.6 06/06/2015 1156   ALKPHOS 60 12/14/2017 2012   ALKPHOS 42 06/06/2015 1156   AST 19 12/14/2017 2012   AST 15 12/07/2017 0830   ALT 11 12/14/2017 2012   ALT 14 12/07/2017 0830   ALT 13 06/06/2015 1156   BILITOT 0.5 12/14/2017 2012   BILITOT 0.7 12/07/2017 0830        Lab Results  Component Value Date   WBC 7.8 12/14/2017   HGB 12.0 (L) 12/14/2017   HCT 38.1 (L) 12/14/2017   MCV 99.0 12/14/2017   PLT 300 12/14/2017     BNP   32/9   12/14/17           Assessment   DOE (dyspnea on exertion) Trial of high dose prednisone and off apalutamide 12/15/2017  - Spirometry 12/15/2017  FEV1 1.90 (64%)  Ratio 72 on no rx with  fvc 2.65 =65% c/w restriction/ not obstruction    Assoc with anorexia, polyuria (in absence of hyperglycemia ) and relatively high K with low bp on no bp meds are all c/w adrenal insuff in pt on a med that can cause it but is usually offset on pred 10   Discussed with Dr Marin Olp who agrees with trial off apalutamide and I will rec a few days of pred 40 mg per day to see what if any effects this has on any of his symptoms   Pulmonary infiltrates PF changes date back to at least  11/12/10 CT abd study   Both the emphasematous and PF findings are longstanding and in the absence of any airflow obstruction or desats do not explain his acute worsening over last few weeks but merit close f/u here with full pfts and walking sats on return - in meantime reasonable to complete his abx started in ER but doubt any of the findings on ct or clinically suggest active infection pulmonary or systemically so reasonable to challenge with prednisone here. (see separate a/p)   Cigarette smoker 4-5 min discussion re active cigarette smoking in addition to office E&M  Ask about tobacco use:   ongoing Advise quitting   I emphasized that although we never turn away smokers from the pulmonary clinic, we do ask that they understand that the recommendations that we make  won't work nearly as well in the presence of continued cigarette exposure. In fact, we may very well  reach a point where we can't promise to help the patient if he/she can't quit smoking. (We can and will promise to try to help, we just can't promise what we recommend will really work)  Assess willingness:  Not committed at  this point - advised his appetite should immediately improve if he quits Assist in quit attempt:  Per PCP when ready Arrange follow up:   Follow up per Primary Care planned       Total time devoted to counseling  > 50 % of initial 60 min office visit:  review case with pt/wife discussion of options/alternatives/ personally creating  written customized instructions  in presence of pt  then going over those specific  Instructions directly with the pt including how to use all of the meds but in particular covering each new medication in detail and the difference between the maintenance= "automatic" meds and the prns using an action plan format for the latter (If this problem/symptom => do that organization reading Left to right).  Please see AVS from this visit for a full list of these instructions which I personally wrote for this pt and  are unique to this visit.          Christinia Gully, MD 12/15/2017

## 2017-12-15 NOTE — Telephone Encounter (Signed)
Oral Oncology Patient Advocate Encounter  Received message from [place] that the  prescription for Alexander Ellis was rejected by insurance due to the patient using a Patient Assistance Program.    I called and spoke with Alexander Ellis about the issue and he wants to continue getting Erleada through [employer] Patient Assistance.  His application was sent to [facility] and [facility] 11/17/17. Patient [employer] opened funds while his application was being processed and he was approved.  We used the funds for his first fill at [facility]  Per patient he is to receive Erleada from [facility] on 12/16/17.  He will pick up Prednisone at [facility].     [employer] patient assistance phone number is [phone].   [place] Patient [place] Phone [phone] Fax [phone] 12/15/2017 8:43 AM

## 2017-12-15 NOTE — Discharge Instructions (Signed)
Your CT scan shows several concerning findings for either infectious, inflammatory, or possibly cancer related changes to your lungs.  It will be important that you take the antibiotics as directed, that way if there is an infectious process this should clear up.  It is important that you follow-up with a pulmonologist regardless.  Please contact the number provided.  Please return to the emergency department if your symptoms change or worsen.

## 2017-12-15 NOTE — Patient Instructions (Addendum)
Increase the prednisone to 40 mg today with supper and every day with breakfast until feeling better then 2 daily until seen by Ennever   Stop the apalutamide until you check with Dr Marin Olp at your next office visit with him (he is aware)   Drink plenty of gatorade    The key is to stop smoking completely before smoking completely stops you!      Please schedule a follow up office visit in 4 weeks, sooner if needed with full pfts on return

## 2017-12-15 NOTE — ED Provider Notes (Signed)
Patient signed out to me at shift change by Dr. Tomi Bamberger.  Patient complains of dyspnea with exertion for the past week or 2.  He also states that he has had a productive cough, but no fever.  Patient is pending a CT PE study.  CT shows diffuse lucent and sclerotic metastases through the visualized skeleton, which is similar to previous study.  There is new patchy nodularity in the posterior right upper lung, which could be infection, inflammation, or metastatic disease.  We will treat with Levaquin.  Will recommend close follow-up with pulmonology.  I discussed these and the other CT findings with the patient.  He will follow-up as directed.  He ambulates maintaining 100% pulse oxygenation in the emergency department.  He states that he feels improved now.  Clear and specific return precautions have been given.   Montine Circle, PA-C 12/15/17 [place]    Dorie Rank, MD 12/15/17 [phone]

## 2017-12-16 ENCOUNTER — Encounter: Payer: Self-pay | Admitting: Internal Medicine

## 2017-12-16 ENCOUNTER — Encounter: Payer: Self-pay | Admitting: Hematology & Oncology

## 2017-12-16 DIAGNOSIS — R918 Other nonspecific abnormal finding of lung field: Secondary | ICD-10-CM | POA: Insufficient documentation

## 2017-12-16 NOTE — Progress Notes (Signed)
Left detailed msg ok per DPR

## 2017-12-16 NOTE — Assessment & Plan Note (Addendum)
PF changes date back to at least  11/12/10 CT abd study   Both the emphasematous and PF findings are longstanding and in the absence of any airflow obstruction or desats do not explain his acute worsening over last few weeks but merit close f/u here with full pfts and walking sats on return - in meantime reasonable to complete his abx started in ER but doubt any of the findings on ct or clinically suggest active infection pulmonary or systemically so reasonable to challenge with prednisone here. (see separate a/p)

## 2017-12-16 NOTE — Assessment & Plan Note (Signed)
Trial of high dose prednisone and off apalutamide 12/15/2017  - Spirometry 12/15/2017  FEV1 1.90 (64%)  Ratio 72 on no rx with fvc 2.65 =65% c/w restriction/ not obstruction    Assoc with anorexia, polyuria (in absence of hyperglycemia ) and relatively high K with low bp on no bp meds are all c/w adrenal insuff in pt on a med that can cause it but is usually offset on pred 10   Discussed with Dr Marin Olp who agrees with trial off apalutamide and I will rec a few days of pred 40 mg per day to see what if any effects this has on any of his symptoms

## 2017-12-16 NOTE — Assessment & Plan Note (Addendum)
4-5 min discussion re active cigarette smoking in addition to office E&M  Ask about tobacco use:   ongoing Advise quitting   I emphasized that although we never turn away smokers from the pulmonary clinic, we do ask that they understand that the recommendations that we make  won't work nearly as well in the presence of continued cigarette exposure. In fact, we may very well  reach a point where we can't promise to help the patient if he/she can't quit smoking. (We can and will promise to try to help, we just can't promise what we recommend will really work)  Assess willingness:  Not committed at this point - advised his appetite should immediately improve if he quits Assist in quit attempt:  Per PCP when ready Arrange follow up:   Follow up per Primary Care planned       Total time devoted to counseling  > 50 % of initial 60 min office visit:  review case with pt/wife discussion of options/alternatives/ personally creating written customized instructions  in presence of pt  then going over those specific  Instructions directly with the pt including how to use all of the meds but in particular covering each new medication in detail and the difference between the maintenance= "automatic" meds and the prns using an action plan format for the latter (If this problem/symptom => do that organization reading Left to right).  Please see AVS from this visit for a full list of these instructions which I personally wrote for this pt and  are unique to this visit.

## 2017-12-22 ENCOUNTER — Telehealth: Payer: Self-pay | Admitting: Internal Medicine

## 2017-12-22 MED ORDER — PREDNISONE 10 MG PO TABS: ORAL_TABLET | ORAL | 0 refills | Status: DC

## 2017-12-22 NOTE — Telephone Encounter (Signed)
Spoke with pt. He was supposed to have a prednisone prescription sent in at his last OV. This was not done. Per MW's AVS instructions, he wanted to the pt to increase his prednisone. Rx has been sent in. Nothing further was needed.

## 2018-01-04 ENCOUNTER — Inpatient Hospital Stay: Payer: Medicare Other

## 2018-01-04 ENCOUNTER — Inpatient Hospital Stay: Payer: Medicare Other | Attending: Hematology & Oncology | Admitting: Hematology & Oncology

## 2018-01-04 ENCOUNTER — Encounter: Payer: Self-pay | Admitting: Hematology & Oncology

## 2018-01-04 ENCOUNTER — Other Ambulatory Visit: Payer: Self-pay

## 2018-01-04 VITALS — BP 90/52 | HR 64 | Temp 97.5°F | Resp 24 | Wt 129.8 lb

## 2018-01-04 VITALS — BP 101/56 | HR 51

## 2018-01-04 DIAGNOSIS — C7951 Secondary malignant neoplasm of bone: Secondary | ICD-10-CM

## 2018-01-04 DIAGNOSIS — C61 Malignant neoplasm of prostate: Secondary | ICD-10-CM | POA: Insufficient documentation

## 2018-01-04 DIAGNOSIS — D500 Iron deficiency anemia secondary to blood loss (chronic): Secondary | ICD-10-CM | POA: Insufficient documentation

## 2018-01-04 DIAGNOSIS — R531 Weakness: Secondary | ICD-10-CM | POA: Diagnosis not present

## 2018-01-04 DIAGNOSIS — E860 Dehydration: Secondary | ICD-10-CM | POA: Insufficient documentation

## 2018-01-04 DIAGNOSIS — H538 Other visual disturbances: Secondary | ICD-10-CM | POA: Diagnosis not present

## 2018-01-04 DIAGNOSIS — R530 Neoplastic (malignant) related fatigue: Secondary | ICD-10-CM | POA: Diagnosis not present

## 2018-01-04 DIAGNOSIS — R634 Abnormal weight loss: Secondary | ICD-10-CM | POA: Insufficient documentation

## 2018-01-04 DIAGNOSIS — D508 Other iron deficiency anemias: Secondary | ICD-10-CM

## 2018-01-04 DIAGNOSIS — R972 Elevated prostate specific antigen [PSA]: Secondary | ICD-10-CM | POA: Insufficient documentation

## 2018-01-04 DIAGNOSIS — M791 Myalgia, unspecified site: Secondary | ICD-10-CM | POA: Insufficient documentation

## 2018-01-04 LAB — FERRITIN: FERRITIN: 2243 ng/mL — AB (ref 24–336)

## 2018-01-04 LAB — CMP (CANCER CENTER ONLY)
ALT: 15 U/L (ref 10–47)
ANION GAP: 6 (ref 5–15)
AST: 16 U/L (ref 11–38)
Albumin: 2.8 g/dL — ABNORMAL LOW (ref 3.5–5.0)
Alkaline Phosphatase: 55 U/L (ref 26–84)
BILIRUBIN TOTAL: 0.5 mg/dL (ref 0.2–1.6)
BUN: 15 mg/dL (ref 7–22)
CALCIUM: 9.2 mg/dL (ref 8.0–10.3)
CO2: 27 mmol/L (ref 18–33)
Chloride: 105 mmol/L (ref 98–108)
Creatinine: 1.1 mg/dL (ref 0.60–1.20)
Glucose, Bld: 124 mg/dL — ABNORMAL HIGH (ref 73–118)
Potassium: 4 mmol/L (ref 3.3–4.7)
Sodium: 138 mmol/L (ref 128–145)
TOTAL PROTEIN: 6.3 g/dL — AB (ref 6.4–8.1)

## 2018-01-04 LAB — CBC WITH DIFFERENTIAL (CANCER CENTER ONLY)
BASOS ABS: 0 10*3/uL (ref 0.0–0.1)
Basophils Relative: 0 %
Eosinophils Absolute: 0.1 10*3/uL (ref 0.0–0.5)
Eosinophils Relative: 1 %
HEMATOCRIT: 31.4 % — AB (ref 38.7–49.9)
Hemoglobin: 10.2 g/dL — ABNORMAL LOW (ref 13.0–17.1)
LYMPHS PCT: 11 %
Lymphs Abs: 1 10*3/uL (ref 0.9–3.3)
MCH: 32.9 pg (ref 28.0–33.4)
MCHC: 32.5 g/dL (ref 32.0–35.9)
MCV: 101.3 fL — AB (ref 82.0–98.0)
MONO ABS: 0.7 10*3/uL (ref 0.1–0.9)
MONOS PCT: 8 %
NEUTROS ABS: 6.8 10*3/uL — AB (ref 1.5–6.5)
Neutrophils Relative %: 80 %
Platelet Count: 225 10*3/uL (ref 145–400)
RBC: 3.1 MIL/uL — ABNORMAL LOW (ref 4.20–5.70)
RDW: 18.7 % — AB (ref 11.1–15.7)
WBC Count: 8.6 10*3/uL (ref 4.0–10.0)

## 2018-01-04 LAB — IRON AND TIBC
IRON: 83 ug/dL (ref 42–163)
Saturation Ratios: 40 % — ABNORMAL LOW (ref 42–163)
TIBC: 208 ug/dL (ref 202–409)
UIBC: 124 ug/dL

## 2018-01-04 MED ORDER — FLUDROCORTISONE ACETATE 0.1 MG PO TABS: 0.1000 mg | ORAL_TABLET | Freq: Every day | ORAL | 5 refills | Status: DC

## 2018-01-04 MED ORDER — SODIUM CHLORIDE 0.9% FLUSH
10.0000 mL | INTRAVENOUS | Status: DC | PRN
  Administered 2018-01-04: 10 mL via INTRAVENOUS
  Filled 2018-01-04: qty 10

## 2018-01-04 MED ORDER — SODIUM CHLORIDE 0.9 % IV SOLN
INTRAVENOUS | Status: AC
  Administered 2018-01-04: 10:00:00 via INTRAVENOUS
  Filled 2018-01-04 (×2): qty 250

## 2018-01-04 MED ORDER — HEPARIN SOD (PORK) LOCK FLUSH 100 UNIT/ML IV SOLN
500.0000 [IU] | Freq: Once | INTRAVENOUS | Status: AC
  Administered 2018-01-04: 500 [IU] via INTRAVENOUS
  Filled 2018-01-04: qty 5

## 2018-01-04 MED ORDER — SODIUM CHLORIDE 0.9 % IV SOLN
Freq: Once | INTRAVENOUS | Status: AC
  Administered 2018-01-04: 10:00:00 via INTRAVENOUS
  Filled 2018-01-04: qty 250

## 2018-01-04 NOTE — Progress Notes (Signed)
Hematology and Oncology Follow Up Visit  Alexander Ellis [id number] 1945/07/23 72 y.o. 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  Unfortunately, looks like his decline continues.  Even though his PSAs come down, he is losing weight.  I think this is somehow evidence that his malignancy is active.  He looks dehydrated.  His wife says he just does not drink as much.  He does not eat that much.  He has had no nausea or vomiting.  Alexander Ellis's would not have any problems with pain outside of that in the right flank.  There is no urinary issues.  There is no dysuria.  He has had no hematuria.  There is been no diarrhea.  Graph he has muscle atrophy in upper and lower extremities.  His PSA has come down.  Last time I saw him, his PSA was down to 179.  Hopefully this is an indicator that the apalutamide is helping.  He has had no fever.  He has had no bleeding.  We did iron studies on him today.  His ferritin is 2200.  Iron saturation is 40%.  Overall, his performance status is ECOG 3.       Medications:  Allergies as of 01/04/2018      Reactions   Oxycodone Other (See Comments)   somnolence Sleep all the time   Penicillins Hives, Rash   Codeine Nausea And Vomiting   Other Nausea Only, Other (See Comments)   Uncoded Allergy. Allergen: IV contrast   Hydrocodone-acetaminophen Nausea And Vomiting   Iohexol Nausea And Vomiting      Medication List        Accurate as of 01/04/18  9:04 AM. Always use your most recent med list.          AZOPT 1 % ophthalmic suspension Generic drug:  brinzolamide INSTILL 1 DROP TWICE A DAY INTO both  eyes-reported 10/26/17   chlorhexidine 0.12 % solution Commonly known as:  PERIDEX Use as directed 15 mLs in the mouth or throat 2 (two) times daily.   clindamycin 300 MG capsule Commonly known as:  CLEOCIN Take 300 mg by mouth 3 (three) times daily.   COMBIGAN 0.2-0.5 % ophthalmic solution Generic drug:  brimonidine-timolol PLACE 1 DROP IN EACH EYE TWICE (2) DAILY   HYDROcodone-acetaminophen 7.5-325 MG tablet Commonly known as:  NORCO Take 1-2 tablets by mouth every 4 (four) hours as needed. for pain   levofloxacin 750 MG tablet Commonly known as:  LEVAQUIN Take 1 tablet (750 mg total) by mouth daily.   Lidocaine 2 % Gel Apply topically. Apply to gums 4 x days with a qtip   lidocaine-prilocaine cream Commonly known as:  EMLA Apply 1 application topically as needed. Apply to port 1-2 hours before coming for treatment.  1/2 tbsp applied but do not rub in.  Place plastic covering over cream.   predniSONE 10 MG tablet Commonly known as:  DELTASONE Take 4 tablets until better then decrease to 2 tablets until seeing Dr. Marin Olp   PROBIOTIC PO Take 1 capsule by mouth daily.   prochlorperazine 10 MG tablet Commonly known  as:  COMPAZINE Take 10 mg by mouth every 6 (six) hours as needed for nausea or vomiting.   ranitidine 300 MG tablet Commonly known as:  ZANTAC Take 300 mg by mouth daily as needed for heartburn.   traMADol 50 MG tablet Commonly known as:  ULTRAM Take 1 tablet (50 mg total) by mouth every 6 (six) hours as needed.       Allergies:  Allergies  Allergen Reactions  . Oxycodone Other (See Comments)    somnolence Sleep all the time  . Penicillins Hives and Rash  . Codeine Nausea And Vomiting  . Other Nausea Only and Other (See Comments)    Uncoded Allergy. Allergen: IV contrast  . Hydrocodone-Acetaminophen Nausea And Vomiting  . Iohexol Nausea And Vomiting    Past Medical History, Surgical history, Social history, and Family History were reviewed and  updated.  Review of Systems: Review of Systems  Constitutional: Positive for malaise/fatigue.  HENT: Negative.   Eyes: Positive for blurred vision.  Respiratory: Negative.   Cardiovascular: Negative.   Gastrointestinal: Negative.   Genitourinary: Negative.   Musculoskeletal: Positive for myalgias.  Skin: Negative.   Neurological: Positive for weakness.  Endo/Heme/Allergies: Negative.   Psychiatric/Behavioral: Negative.      Physical Exam:  weight is 129 lb 12 oz (58.9 kg). His oral temperature is 97.5 F (36.4 C) (abnormal). His blood pressure is 90/52 (abnormal) and his pulse is 64. His respiration is 24 (abnormal) and oxygen saturation is 98%.   Wt Readings from Last 3 Encounters:  01/04/18 129 lb 12 oz (58.9 kg)  12/15/17 127 lb (57.6 kg)  12/14/17 129 lb (58.5 kg)    Physical Exam  Constitutional: He is oriented to person, place, and time.  HENT:  Head: Normocephalic and atraumatic.  Mouth/Throat: Oropharynx is clear and moist.  Eyes: Pupils are equal, round, and reactive to light. EOM are normal.  Neck: Normal range of motion.  Cardiovascular: Normal rate, regular rhythm and normal heart sounds.  Pulmonary/Chest: Effort normal and breath sounds normal.  Abdominal: Soft. Bowel sounds are normal.  Musculoskeletal: Normal range of motion. He exhibits no edema, tenderness or deformity.  Lymphadenopathy:    He has no cervical adenopathy.  Neurological: He is alert and oriented to person, place, and time.  Skin: Skin is warm and dry. No rash noted. No erythema.  Psychiatric: He has a normal mood and affect. His behavior is normal. Judgment and thought content normal.  Vitals reviewed.    Lab Results  Component Value Date   WBC 8.6 01/04/2018   HGB 10.2 (L) 01/04/2018   HCT 31.4 (L) 01/04/2018   MCV 101.3 (H) 01/04/2018   PLT 225 01/04/2018   Lab Results  Component Value Date   FERRITIN 2,139 (H) 11/16/2017   IRON 42 11/16/2017   TIBC 215 11/16/2017   UIBC  173 11/16/2017   IRONPCTSAT 20 (L) 11/16/2017   Lab Results  Component Value Date   RETICCTPCT 1.1 01/20/2015   RBC 3.10 (L) 01/04/2018   RETICCTABS 50.2 01/20/2015   No results found for: KPAFRELGTCHN, LAMBDASER, KAPLAMBRATIO No results found for: IGGSERUM, IGA, IGMSERUM No results found for: Ronnald Ramp, A1GS, A2GS, Violet Baldy, MSPIKE, SPEI   Chemistry      Component Value Date/Time   NA 138 01/04/2018 0832   NA 147 (H) 06/06/2015 1156   K 4.0 01/04/2018 0832   K 5.4 (H) 06/06/2015 1156   CL 105 01/04/2018 0832   CL 106 06/06/2015 1156  CO2 27 01/04/2018 0832   CO2 26 06/06/2015 1156   BUN 15 01/04/2018 0832   BUN 18 06/06/2015 1156   CREATININE 1.10 01/04/2018 0832   CREATININE 1.1 06/06/2015 1156      Component Value Date/Time   CALCIUM 9.2 01/04/2018 0832   CALCIUM 9.6 06/06/2015 1156   ALKPHOS 55 01/04/2018 0832   ALKPHOS 42 06/06/2015 1156   AST 16 01/04/2018 0832   ALT 15 01/04/2018 0832   ALT 13 06/06/2015 1156   BILITOT 0.5 01/04/2018 0832      Impression and Plan: Alexander Ellis is a very pleasant 72 yo caucasian gentleman with progressive castrate resistant prostate cancer.   We did send off another PSA on him today.  Unfortunately it is now gone back up.  His PSA was 244 today.  I really think that we are in a situation that we are going to have to think about comfort care and just his quality of life.  He is not a candidate for any systemic chemotherapy as his performance status is just not good enough and I think systemic therapy would have a chance of helping him less than 20%.  We will go ahead and give him some IV fluid today.  We will see if this can help him a little bit.  Again the rate loss really is a problem.  I think this is indicative of the activity of his underlying prostate cancer.  I would like to get him back to see me in about 2-3 weeks.  At that time, we will probably need to talk about changing our focus and making  sure that he has comfort, respect and dignity.  I think in order to achieve this, we need to get hospice involved.  I think that this will be difficult for him to accept.  He has been as active and as proactive as anybody.  Again, this is the reality of his prostate cancer.  If, for some reason, his performance status improves significantly, then we might consider chemotherapy.  I just do not see this happening.       Volanda Napoleon, MD 8/14/20199:04 AM

## 2018-01-04 NOTE — Patient Instructions (Signed)
Dehydration, Adult Dehydration is a condition in which there is not enough fluid or water in the body. This happens when you lose more fluids than you take in. Important organs, such as the kidneys, brain, and heart, cannot function without a proper amount of fluids. Any loss of fluids from the body can lead to dehydration. Dehydration can range from mild to severe. This condition should be treated right away to prevent it from becoming severe. What are the causes? This condition may be caused by:  Vomiting.  Diarrhea.  Excessive sweating, such as from heat exposure or exercise.  Not drinking enough fluid, especially: ? When ill. ? While doing activity that requires a lot of energy.  Excessive urination.  Fever.  Infection.  Certain medicines, such as medicines that cause the body to lose excess fluid (diuretics).  Inability to access safe drinking water.  Reduced physical ability to get adequate water and food.  What increases the risk? This condition is more likely to develop in people:  Who have a poorly controlled long-term (chronic) illness, such as diabetes, heart disease, or kidney disease.  Who are age 65 or older.  Who are disabled.  Who live in a place with high altitude.  Who play endurance sports.  What are the signs or symptoms? Symptoms of mild dehydration may include:  Thirst.  Dry lips.  Slightly dry mouth.  Dry, warm skin.  Dizziness. Symptoms of moderate dehydration may include:  Very dry mouth.  Muscle cramps.  Dark urine. Urine may be the color of tea.  Decreased urine production.  Decreased tear production.  Heartbeat that is irregular or faster than normal (palpitations).  Headache.  Light-headedness, especially when you stand up from a sitting position.  Fainting (syncope). Symptoms of severe dehydration may include:  Changes in skin, such as: ? Cold and clammy skin. ? Blotchy (mottled) or pale skin. ? Skin that does  not quickly return to normal after being lightly pinched and released (poor skin turgor).  Changes in body fluids, such as: ? Extreme thirst. ? No tear production. ? Inability to sweat when body temperature is high, such as in hot weather. ? Very little urine production.  Changes in vital signs, such as: ? Weak pulse. ? Pulse that is more than 100 beats a minute when sitting still. ? Rapid breathing. ? Low blood pressure.  Other changes, such as: ? Sunken eyes. ? Cold hands and feet. ? Confusion. ? Lack of energy (lethargy). ? Difficulty waking up from sleep. ? Short-term weight loss. ? Unconsciousness. How is this diagnosed? This condition is diagnosed based on your symptoms and a physical exam. Blood and urine tests may be done to help confirm the diagnosis. How is this treated? Treatment for this condition depends on the severity. Mild or moderate dehydration can often be treated at home. Treatment should be started right away. Do not wait until dehydration becomes severe. Severe dehydration is an emergency and it needs to be treated in a hospital. Treatment for mild dehydration may include:  Drinking more fluids.  Replacing salts and minerals in your blood (electrolytes) that you may have lost. Treatment for moderate dehydration may include:  Drinking an oral rehydration solution (ORS). This is a drink that helps you replace fluids and electrolytes (rehydrate). It can be found at pharmacies and retail stores. Treatment for severe dehydration may include:  Receiving fluids through an IV tube.  Receiving an electrolyte solution through a feeding tube that is passed through your nose   and into your stomach (nasogastric tube, or NG tube).  Correcting any abnormalities in electrolytes.  Treating the underlying cause of dehydration. Follow these instructions at home:  If directed by your health care provider, drink an ORS: ? Make an ORS by following instructions on the  package. ? Start by drinking small amounts, about  cup (120 mL) every 5-10 minutes. ? Slowly increase how much you drink until you have taken the amount recommended by your health care provider.  Drink enough clear fluid to keep your urine clear or pale yellow. If you were told to drink an ORS, finish the ORS first, then start slowly drinking other clear fluids. Drink fluids such as: ? Water. Do not drink only water. Doing that can lead to having too little salt (sodium) in the body (hyponatremia). ? Ice chips. ? Fruit juice that you have added water to (diluted fruit juice). ? Low-calorie sports drinks.  Avoid: ? Alcohol. ? Drinks that contain a lot of sugar. These include high-calorie sports drinks, fruit juice that is not diluted, and soda. ? Caffeine. ? Foods that are greasy or contain a lot of fat or sugar.  Take over-the-counter and prescription medicines only as told by your health care provider.  Do not take sodium tablets. This can lead to having too much sodium in the body (hypernatremia).  Eat foods that contain a healthy balance of electrolytes, such as bananas, oranges, potatoes, tomatoes, and spinach.  Keep all follow-up visits as told by your health care provider. This is important. Contact a health care provider if:  You have abdominal pain that: ? Gets worse. ? Stays in one area (localizes).  You have a rash.  You have a stiff neck.  You are more irritable than usual.  You are sleepier or more difficult to wake up than usual.  You feel weak or dizzy.  You feel very thirsty.  You have urinated only a small amount of very dark urine over 6-8 hours. Get help right away if:  You have symptoms of severe dehydration.  You cannot drink fluids without vomiting.  Your symptoms get worse with treatment.  You have a fever.  You have a severe headache.  You have vomiting or diarrhea that: ? Gets worse. ? Does not go away.  You have blood or green matter  (bile) in your vomit.  You have blood in your stool. This may cause stool to look black and tarry.  You have not urinated in 6-8 hours.  You faint.  Your heart rate while sitting still is over 100 beats a minute.  You have trouble breathing. This information is not intended to replace advice given to you by your health care provider. Make sure you discuss any questions you have with your health care provider. Document Released: 05/10/2005 Document Revised: 12/05/2015 Document Reviewed: 07/04/2015 Elsevier Interactive Patient Education  2018 Elsevier Inc.  

## 2018-01-05 LAB — PROSTATE-SPECIFIC AG, SERUM (LABCORP): Prostate Specific Ag, Serum: 224 ng/mL — ABNORMAL HIGH (ref 0.0–4.0)

## 2018-01-10 ENCOUNTER — Encounter: Payer: Self-pay | Admitting: Hematology & Oncology

## 2018-01-11 ENCOUNTER — Other Ambulatory Visit: Payer: Self-pay | Admitting: Internal Medicine

## 2018-01-11 DIAGNOSIS — R0609 Other forms of dyspnea: Principal | ICD-10-CM

## 2018-01-12 ENCOUNTER — Telehealth: Payer: Self-pay | Admitting: *Deleted

## 2018-01-12 ENCOUNTER — Telehealth: Payer: Self-pay | Admitting: Internal Medicine

## 2018-01-12 ENCOUNTER — Ambulatory Visit (INDEPENDENT_AMBULATORY_CARE_PROVIDER_SITE_OTHER): Payer: Medicare Other | Admitting: Internal Medicine

## 2018-01-12 ENCOUNTER — Encounter: Payer: Self-pay | Admitting: Internal Medicine

## 2018-01-12 ENCOUNTER — Ambulatory Visit (INDEPENDENT_AMBULATORY_CARE_PROVIDER_SITE_OTHER)
Admission: RE | Admit: 2018-01-12 | Discharge: 2018-01-12 | Disposition: A | Discharge status: Home / Self Care | Payer: Medicare Other | Source: Ambulatory Visit | Attending: Internal Medicine | Admitting: Internal Medicine

## 2018-01-12 VITALS — BP 100/58 | HR 56 | Ht 69.0 in | Wt 134.0 lb

## 2018-01-12 DIAGNOSIS — C61 Malignant neoplasm of prostate: Secondary | ICD-10-CM

## 2018-01-12 DIAGNOSIS — I6522 Occlusion and stenosis of left carotid artery: Secondary | ICD-10-CM

## 2018-01-12 DIAGNOSIS — R0609 Other forms of dyspnea: Secondary | ICD-10-CM | POA: Diagnosis not present

## 2018-01-12 DIAGNOSIS — R918 Other nonspecific abnormal finding of lung field: Secondary | ICD-10-CM

## 2018-01-12 DIAGNOSIS — C7951 Secondary malignant neoplasm of bone: Principal | ICD-10-CM

## 2018-01-12 DIAGNOSIS — F17210 Nicotine dependence, cigarettes, uncomplicated: Secondary | ICD-10-CM | POA: Diagnosis not present

## 2018-01-12 LAB — PULMONARY FUNCTION TEST
DL/VA % PRED: 27 %
DL/VA: 1.23 ml/min/mmHg/L
DLCO UNC % PRED: 14 %
DLCO cor % pred: 16 %
DLCO cor: 5.17 ml/min/mmHg
DLCO unc: 4.39 ml/min/mmHg
FEF 25-75 Post: 1.79 L/sec
FEF 25-75 Pre: 1.77 L/sec
FEF2575-%CHANGE-POST: 1 %
FEF2575-%Pred-Post: 83 %
FEF2575-%Pred-Pre: 81 %
FEV1-%CHANGE-POST: 1 %
FEV1-%Pred-Post: 79 %
FEV1-%Pred-Pre: 79 %
FEV1-PRE: 2.34 L
FEV1-Post: 2.37 L
FEV1FVC-%CHANGE-POST: 5 %
FEV1FVC-%Pred-Pre: 101 %
FEV6-%Change-Post: -4 %
FEV6-%Pred-Post: 79 %
FEV6-%Pred-Pre: 82 %
FEV6-PRE: 3.17 L
FEV6-Post: 3.03 L
FEV6FVC-%Pred-Post: 106 %
FEV6FVC-%Pred-Pre: 106 %
FVC-%Change-Post: -4 %
FVC-%PRED-POST: 74 %
FVC-%PRED-PRE: 77 %
FVC-POST: 3.03 L
FVC-PRE: 3.17 L
POST FEV1/FVC RATIO: 78 %
PRE FEV6/FVC RATIO: 100 %
Post FEV6/FVC ratio: 100 %
Pre FEV1/FVC ratio: 74 %
RV % PRED: 110 %
RV: 2.74 L
TLC % pred: 82 %
TLC: 5.65 L

## 2018-01-12 MED ORDER — APALUTAMIDE 60 MG PO TABS: 240.0000 mg | ORAL_TABLET | Freq: Every day | ORAL | 3 refills | Status: DC

## 2018-01-12 NOTE — Progress Notes (Signed)
PFT done today. 

## 2018-01-12 NOTE — Telephone Encounter (Signed)
Notes recorded by Karmen Stabs, RN on 01/12/2018 at 1:33 PM EDT [facility] re:CXR results ------  Notes recorded by Tanda Rockers, MD on 01/12/2018 at 1:29 PM EDT Call pt: Reviewed cxr and no acute change so no change in recommendations made at ov  Pt aware of results. No further questions or concerns.

## 2018-01-12 NOTE — Telephone Encounter (Signed)
Patient states that Dr Marin Olp told him to start his [place] again, and after 7 days, to call the office and let us know how he tolerated medication.  Patient has been on medication for 7 days now and is tolerating well with no problems.   Reviewed with Dr Marin Olp. He would like patient to restart [place] daily. Patient states he needs a new prescription sent. New prescription sent.

## 2018-01-12 NOTE — Patient Instructions (Addendum)
Reduce the prednisone to 10 mg every day and let Dr Marin Olp guide you on further adjustments   Stay active walk same pace you did today as much as you can  = today flat in air conditioning you walked 150 yards and the lowest 02sat = 92%   >>>  Let me know if this number starts dropping as you adjust the prednisone down or off    Please remember to go to the  x-ray department downstairs in the basement  for your tests - we will call you with the results when they are available.       Please schedule a follow up visit in 3 months but call sooner if needed with pfts on return

## 2018-01-12 NOTE — Progress Notes (Signed)
Alexander Ellis, male    DOB: 05/31/1945, 72 y.o.   MRN: [id number]     Brief patient profile:  72 yowm quit smoking end of July 2019  with met prostatic ca on rx per Alexander Ellis referred to pulmonary clinic 12/15/2017 by ER for sob. Sep 22 2017 CT showed ILD and breathing downhill x early July 2019 with worse sob/ weakness/ polyuria on apalutamide which suppresses adrenal function.    History of Present Illness  12/15/2017  f/u ov/Alexander Ellis re:  Chief Complaint  Patient presents with  . Pulmonary Consult    Referred by Dr. Tomi Ellis. Pt c/o SOB x 2 wks, worse over the past wk. He states he is SOB "all the time" unless he is lying down.    indolent onset progressively worse fatigue/ sob s cough x "a while" (never really able to be sure when actually started) but much worse x 2 weeks prior to OV  s assoc cough / wheeze and has been on prednisone 10 mg daily  With apalutamide since  11/16/17  Previously on Taxotere   Assoc anorexia and polyuria  rec Increase the prednisone to 40 mg today with supper and every day with breakfast until feeling better then 2 daily until seen by Alexander Ellis  Stop the apalutamide until you check with Dr Marin Ellis at your next office visit with him (he is aware)  Drink plenty of gatorade  The key is to stop smoking completely before smoking completely stops you!     01/12/2018  f/u ov/Alexander Ellis re: ILD since at least 10/2010  / off cigs since 12/21/17 and back on apalutamide Aug 14-21st prednisone 20 mg Chief Complaint  Patient presents with  . Follow-up    f/u DOE, still SOB witrh exertion, still on prednisone   Dyspnea:  .MMRC3 = can't walk 100 yards even at a slow pace at a flat grade s stopping due to sob = baseline  Cough: none   No obvious day to day or daytime variability or assoc excess/ purulent sputum or mucus plugs or hemoptysis or cp or chest tightness, subjective wheeze or overt sinus or hb symptoms.   Sleeping flat  without nocturnal  or early am exacerbation  of  respiratory  c/o's or need for noct saba. Also denies any obvious fluctuation of symptoms with weather or environmental changes or other aggravating or alleviating factors except as outlined above   No unusual exposure hx or h/o childhood pna/ asthma or knowledge of premature birth.  Current Allergies, Complete Past Medical History, Past Surgical History, Family History, and Social History were reviewed in [employer] record.  ROS  The following are not active complaints unless bolded Hoarseness, sore throat, dysphagia, dental problems, itching, sneezing,  nasal congestion or discharge of excess mucus or purulent secretions, ear ache,   fever, chills, sweats, unintended wt loss or wt gain, classically pleuritic or exertional cp,  orthopnea pnd or arm/hand swelling  or leg swelling, presyncope, palpitations, abdominal pain, anorexia, nausea, vomiting, diarrhea  or change in bowel habits or change in bladder habits, change in stools or change in urine, dysuria, hematuria,  rash, arthralgias, visual complaints, headache, numbness, weakness or ataxia or problems with walking or coordination,  change in mood or  memory.        Current Meds  Medication Sig  . AZOPT 1 % ophthalmic suspension INSTILL 1 DROP TWICE A DAY INTO both eyes-reported 10/26/17  . chlorhexidine (PERIDEX) 0.12 % solution Use as directed 15  mLs in the mouth or throat 2 (two) times daily.  . clindamycin (CLEOCIN) 300 MG capsule Take 300 mg by mouth 3 (three) times daily.  . COMBIGAN 0.2-0.5 % ophthalmic solution PLACE 1 DROP IN EACH EYE TWICE (2) DAILY  . erythromycin (ERY-TAB) 250 MG EC tablet Take 250 mg by mouth 2 (two) times daily.  . fludrocortisone (FLORINEF) 0.1 MG tablet Take 1 tablet (0.1 mg total) by mouth daily.  . Lidocaine 2 % GEL Apply topically. Apply to gums 4 x days with a qtip  . lidocaine-prilocaine (EMLA) cream Apply 1 application topically as needed. Apply to port 1-2 hours before coming for  treatment.  1/2 tbsp applied but do not rub in.  Place plastic covering over cream.  . predniSONE (DELTASONE) 10 MG tablet Take 4 tablets until better then decrease to 2 tablets until seeing Alexander Ellis  . Probiotic Product (PROBIOTIC PO) Take 1 capsule by mouth daily.  . prochlorperazine (COMPAZINE) 10 MG tablet Take 10 mg by mouth every 6 (six) hours as needed for nausea or vomiting.  . ranitidine (ZANTAC) 300 MG tablet Take 300 mg by mouth daily as needed for heartburn.   . traMADol (ULTRAM) 50 MG tablet Take 1 tablet (50 mg total) by mouth every 6 (six) hours as needed.                Objective:     amb wm nad   01/12/2018        134   12/15/17 127 lb (57.6 kg)  12/14/17 129 lb (58.5 kg)  12/07/17 132 lb 8 oz (60.1 kg)    Vital signs reviewed - Note on arrival 02 sats  97% on RA        HEENT: nl dentition, turbinates bilaterally, and oropharynx. Nl external ear canals without cough reflex   NECK :  without JVD/Nodes/TM/ nl carotid upstrokes bilaterally   LUNGS: no acc muscle use,  Nl contour chest  With mild crackles L > R base  without cough on insp or exp maneuvers   CV:  RRR  no s3 or murmur or increase in P2, and no edema   ABD:  soft and nontender with nl inspiratory excursion in the supine position. No bruits or organomegaly appreciated, bowel sounds nl  MS:  Nl gait/ ext warm without deformities, calf tenderness, cyanosis  - mod clubbing  No obvious joint restrictions   SKIN: warm and dry without lesions    NEURO:  alert, approp, nl sensorium with  no motor or cerebellar deficits apparent.               Assessment

## 2018-01-13 ENCOUNTER — Encounter: Payer: Self-pay | Admitting: Internal Medicine

## 2018-01-13 MED ORDER — APALUTAMIDE 60 MG PO TABS: 240.0000 mg | ORAL_TABLET | Freq: Every day | ORAL | 3 refills | Status: DC

## 2018-01-13 NOTE — Assessment & Plan Note (Signed)
Trial of high dose prednisone and off apalutamide 12/15/2017 > rechallenged Jan 04 2018 x one week s recurrence    Further rx per Dr Marin Olp

## 2018-01-13 NOTE — Addendum Note (Signed)
Addended by: Darl Pikes on: 01/13/2018 09:30 AM   Modules accepted: Orders

## 2018-01-13 NOTE — Telephone Encounter (Signed)
Oral Chemotherapy Pharmacist Encounter   Patient is on patient assistance through [place] and receiving his medication from [facility]. Prescription for Alexander Ellis was redirected to [facility].  Instructed Alexander Ellis to reach out to the pharmacy this afternoon to have them process a refill for Alexander Ellis.    Darl Pikes, PharmD, BCPS, [facility] Hematology/Oncology Clinical Pharmacist [facility] [phone]  01/13/2018 9:19 AM

## 2018-01-13 NOTE — Assessment & Plan Note (Addendum)
resoved s airflow obst on today's pfts/ no need for bronchodilators    I reviewed the Fletcher curve with the patient that basically indicates  if you quit smoking when your best day FEV1 is still well preserved (as is clearly  the case here)  it is highly unlikely you will progress to severe disease and informed the patient there was  no medication on the market that has proven to alter the curve/ its downward trajectory  or the likelihood of progression of their disease(unlike other chronic medical conditions such as atheroclerosis where we do think we can change the natural hx with risk reducing meds)    Therefore stopping smoking and maintaining abstinence are  the most important aspects of his care, not choice of inhalers or for that matter, doctors.   Treatment other than smoking cessation  is entirely directed by severity of symptoms and focused also on reducing exacerbations, not attempting to change the natural history of the disease.     I had an extended discussion with the patient reviewing all relevant studies completed to date and  lasting 15 to 20 minutes of a 25 minute visit      Each maintenance medication was reviewed in detail including most importantly the difference between maintenance and prns and under what circumstances the prns are to be triggered using an action plan format that is not reflected in the computer generated alphabetically organized AVS.    Please see AVS for specific instructions unique to this visit that I personally wrote and verbalized to the the pt in detail and then reviewed with pt  by my nurse highlighting any  changes in therapy recommended at today's visit to their plan of care.

## 2018-01-13 NOTE — Assessment & Plan Note (Signed)
PF changes date back to at least  11/12/10 CT abd study  - PFT's  01/12/2018  FEV1 2.37 (79 % ) ratio 78  p 1 % improvement from saba p nothing prior to study with DLCO  14/16c % corrects to 27 % for alv volume   - 01/12/2018  Walked RA x 3 laps @ 185 ft each stopped due to  End of study, nl pace, no sob or desat  Longstanding pf so main focus here should be on regular paced walking and monitoring sats as prednisone is tapered off as not clear it really helped here but needed to offset the systemic side effects of apalutamide so defer taper to Dr Martha Clan and f/u here q 3 m

## 2018-01-17 ENCOUNTER — Telehealth: Payer: Self-pay | Admitting: Internal Medicine

## 2018-01-17 NOTE — Telephone Encounter (Signed)
Called and spoke with patients wife, she is aware of results and verbalized understanding. Nothing further needed.  

## 2018-02-02 ENCOUNTER — Telehealth: Payer: Self-pay | Admitting: *Deleted

## 2018-02-02 NOTE — Telephone Encounter (Signed)
ROI faxed to [facility] - att: Gay Filler.; release [phone]

## 2018-02-03 DIAGNOSIS — C7802 Secondary malignant neoplasm of left lung: Secondary | ICD-10-CM

## 2018-02-03 DIAGNOSIS — C61 Malignant neoplasm of prostate: Secondary | ICD-10-CM | POA: Diagnosis not present

## 2018-02-03 DIAGNOSIS — Z9221 Personal history of antineoplastic chemotherapy: Secondary | ICD-10-CM

## 2018-02-03 DIAGNOSIS — E860 Dehydration: Secondary | ICD-10-CM

## 2018-02-03 DIAGNOSIS — C7951 Secondary malignant neoplasm of bone: Secondary | ICD-10-CM

## 2018-02-03 DIAGNOSIS — C7801 Secondary malignant neoplasm of right lung: Secondary | ICD-10-CM

## 2018-02-20 ENCOUNTER — Telehealth: Payer: Self-pay | Admitting: Hematology & Oncology

## 2018-02-20 ENCOUNTER — Other Ambulatory Visit: Payer: Self-pay | Admitting: *Deleted

## 2018-02-20 MED ORDER — PREDNISONE 10 MG PO TABS: 10.0000 mg | ORAL_TABLET | Freq: Every day | ORAL | 0 refills | Status: DC

## 2018-02-20 NOTE — Telephone Encounter (Signed)
sw pt wife re lab/md appt follow up. Wife stated that they have found a MD in [place] where they live now.

## 2018-02-21 ENCOUNTER — Ambulatory Visit: Payer: Medicare Other | Admitting: Hematology & Oncology

## 2018-02-21 ENCOUNTER — Other Ambulatory Visit: Payer: Medicare Other

## 2018-02-23 DIAGNOSIS — I9589 Other hypotension: Secondary | ICD-10-CM

## 2018-02-23 DIAGNOSIS — C7801 Secondary malignant neoplasm of right lung: Secondary | ICD-10-CM

## 2018-02-23 DIAGNOSIS — Z9221 Personal history of antineoplastic chemotherapy: Secondary | ICD-10-CM

## 2018-02-23 DIAGNOSIS — C7951 Secondary malignant neoplasm of bone: Secondary | ICD-10-CM | POA: Diagnosis not present

## 2018-02-23 DIAGNOSIS — C7802 Secondary malignant neoplasm of left lung: Secondary | ICD-10-CM

## 2018-02-23 DIAGNOSIS — C61 Malignant neoplasm of prostate: Secondary | ICD-10-CM | POA: Diagnosis not present

## 2018-02-27 ENCOUNTER — Other Ambulatory Visit: Payer: Self-pay | Admitting: *Deleted

## 2018-02-27 DIAGNOSIS — C61 Malignant neoplasm of prostate: Secondary | ICD-10-CM

## 2018-02-27 DIAGNOSIS — C7951 Secondary malignant neoplasm of bone: Principal | ICD-10-CM

## 2018-02-27 MED ORDER — ONDANSETRON HCL 8 MG PO TABS: 8.0000 mg | ORAL_TABLET | Freq: Two times a day (BID) | ORAL | 1 refills | Status: AC | PRN

## 2018-03-14 ENCOUNTER — Inpatient Hospital Stay (HOSPITAL_COMMUNITY)
Admission: AD | Admit: 2018-03-14 | Discharge: 2018-03-17 | DRG: 189 | Disposition: A | Discharge status: Home / Self Care | Payer: Medicare Other | Source: Ambulatory Visit | Attending: Internal Medicine | Admitting: Internal Medicine

## 2018-03-14 ENCOUNTER — Other Ambulatory Visit: Payer: Self-pay

## 2018-03-14 ENCOUNTER — Encounter: Payer: Self-pay | Admitting: Primary Care

## 2018-03-14 ENCOUNTER — Ambulatory Visit (INDEPENDENT_AMBULATORY_CARE_PROVIDER_SITE_OTHER): Payer: Medicare Other | Admitting: Primary Care

## 2018-03-14 ENCOUNTER — Inpatient Hospital Stay (HOSPITAL_COMMUNITY): Payer: Medicare Other

## 2018-03-14 ENCOUNTER — Encounter (HOSPITAL_COMMUNITY): Payer: Self-pay

## 2018-03-14 VITALS — BP 110/70 | HR 97 | Temp 97.3°F | Ht 69.0 in | Wt 127.0 lb

## 2018-03-14 DIAGNOSIS — Z66 Do not resuscitate: Secondary | ICD-10-CM | POA: Diagnosis present

## 2018-03-14 DIAGNOSIS — J9611 Chronic respiratory failure with hypoxia: Secondary | ICD-10-CM | POA: Diagnosis present

## 2018-03-14 DIAGNOSIS — Z880 Allergy status to penicillin: Secondary | ICD-10-CM | POA: Diagnosis not present

## 2018-03-14 DIAGNOSIS — K7460 Unspecified cirrhosis of liver: Secondary | ICD-10-CM | POA: Diagnosis present

## 2018-03-14 DIAGNOSIS — Z8546 Personal history of malignant neoplasm of prostate: Secondary | ICD-10-CM | POA: Diagnosis not present

## 2018-03-14 DIAGNOSIS — K5909 Other constipation: Secondary | ICD-10-CM | POA: Diagnosis present

## 2018-03-14 DIAGNOSIS — J9601 Acute respiratory failure with hypoxia: Secondary | ICD-10-CM | POA: Diagnosis not present

## 2018-03-14 DIAGNOSIS — Z885 Allergy status to narcotic agent status: Secondary | ICD-10-CM

## 2018-03-14 DIAGNOSIS — Z8249 Family history of ischemic heart disease and other diseases of the circulatory system: Secondary | ICD-10-CM

## 2018-03-14 DIAGNOSIS — C7800 Secondary malignant neoplasm of unspecified lung: Secondary | ICD-10-CM | POA: Diagnosis present

## 2018-03-14 DIAGNOSIS — J9602 Acute respiratory failure with hypercapnia: Secondary | ICD-10-CM

## 2018-03-14 DIAGNOSIS — B181 Chronic viral hepatitis B without delta-agent: Secondary | ICD-10-CM | POA: Diagnosis present

## 2018-03-14 DIAGNOSIS — J982 Interstitial emphysema: Secondary | ICD-10-CM | POA: Diagnosis present

## 2018-03-14 DIAGNOSIS — K5900 Constipation, unspecified: Secondary | ICD-10-CM | POA: Diagnosis not present

## 2018-03-14 DIAGNOSIS — K219 Gastro-esophageal reflux disease without esophagitis: Secondary | ICD-10-CM | POA: Diagnosis present

## 2018-03-14 DIAGNOSIS — J9690 Respiratory failure, unspecified, unspecified whether with hypoxia or hypercapnia: Secondary | ICD-10-CM

## 2018-03-14 DIAGNOSIS — J9600 Acute respiratory failure, unspecified whether with hypoxia or hypercapnia: Secondary | ICD-10-CM | POA: Diagnosis not present

## 2018-03-14 DIAGNOSIS — Z825 Family history of asthma and other chronic lower respiratory diseases: Secondary | ICD-10-CM | POA: Diagnosis not present

## 2018-03-14 DIAGNOSIS — E278 Other specified disorders of adrenal gland: Secondary | ICD-10-CM | POA: Diagnosis present

## 2018-03-14 DIAGNOSIS — R64 Cachexia: Secondary | ICD-10-CM | POA: Diagnosis present

## 2018-03-14 DIAGNOSIS — Z515 Encounter for palliative care: Secondary | ICD-10-CM

## 2018-03-14 DIAGNOSIS — J9621 Acute and chronic respiratory failure with hypoxia: Principal | ICD-10-CM | POA: Diagnosis present

## 2018-03-14 DIAGNOSIS — E43 Unspecified severe protein-calorie malnutrition: Secondary | ICD-10-CM | POA: Diagnosis present

## 2018-03-14 DIAGNOSIS — J9819 Other pulmonary collapse: Secondary | ICD-10-CM | POA: Diagnosis present

## 2018-03-14 DIAGNOSIS — J9620 Acute and chronic respiratory failure, unspecified whether with hypoxia or hypercapnia: Secondary | ICD-10-CM

## 2018-03-14 DIAGNOSIS — C799 Secondary malignant neoplasm of unspecified site: Secondary | ICD-10-CM | POA: Diagnosis not present

## 2018-03-14 DIAGNOSIS — Z681 Body mass index (BMI) 19 or less, adult: Secondary | ICD-10-CM | POA: Diagnosis not present

## 2018-03-14 DIAGNOSIS — Z87891 Personal history of nicotine dependence: Secondary | ICD-10-CM

## 2018-03-14 DIAGNOSIS — C7951 Secondary malignant neoplasm of bone: Secondary | ICD-10-CM | POA: Diagnosis present

## 2018-03-14 LAB — BLOOD GAS, ARTERIAL
ACID-BASE DEFICIT: 0.2 mmol/L (ref 0.0–2.0)
BICARBONATE: 21.9 mmol/L (ref 20.0–28.0)
Drawn by: 295031
O2 Content: 4 L/min
O2 Saturation: 87.9 %
PCO2 ART: 28.7 mmHg — AB (ref 32.0–48.0)
PH ART: 7.495 — AB (ref 7.350–7.450)
PO2 ART: 54.6 mmHg — AB (ref 83.0–108.0)
Patient temperature: 37

## 2018-03-14 LAB — URINALYSIS, ROUTINE W REFLEX MICROSCOPIC
BILIRUBIN URINE: NEGATIVE
BILIRUBIN URINE: NEGATIVE
Bacteria, UA: NONE SEEN
GLUCOSE, UA: NEGATIVE mg/dL
Glucose, UA: NEGATIVE mg/dL
KETONES UR: NEGATIVE mg/dL
KETONES UR: NEGATIVE mg/dL
LEUKOCYTES UA: NEGATIVE
Leukocytes, UA: NEGATIVE
Nitrite: NEGATIVE
Nitrite: NEGATIVE
PH: 5 (ref 5.0–8.0)
PH: 6 (ref 5.0–8.0)
PROTEIN: 30 mg/dL — AB
Protein, ur: 30 mg/dL — AB
RBC / HPF: >50 RBC/hpf — ABNORMAL HIGH (ref 0–5)
Specific Gravity, Urine: 1.023 (ref 1.005–1.030)
Specific Gravity, Urine: 1.024 (ref 1.005–1.030)

## 2018-03-14 LAB — CBC WITH DIFFERENTIAL/PLATELET
ABS IMMATURE GRANULOCYTES: 0.13 10*3/uL — AB (ref 0.00–0.07)
BASOS PCT: 0 %
Basophils Absolute: 0 10*3/uL (ref 0.0–0.1)
Eosinophils Absolute: 0 10*3/uL (ref 0.0–0.5)
Eosinophils Relative: 0 %
HEMATOCRIT: 30.9 % — AB (ref 39.0–52.0)
Hemoglobin: 9.7 g/dL — ABNORMAL LOW (ref 13.0–17.0)
IMMATURE GRANULOCYTES: 1 %
Lymphocytes Relative: 6 %
Lymphs Abs: 0.6 10*3/uL — ABNORMAL LOW (ref 0.7–4.0)
MCH: 33.4 pg (ref 26.0–34.0)
MCHC: 31.4 g/dL (ref 30.0–36.0)
MCV: 106.6 fL — AB (ref 80.0–100.0)
Monocytes Absolute: 0.8 10*3/uL (ref 0.1–1.0)
Monocytes Relative: 7 %
NEUTROS ABS: 9 10*3/uL — AB (ref 1.7–7.7)
NEUTROS PCT: 86 %
PLATELETS: 264 10*3/uL (ref 150–400)
RBC: 2.9 MIL/uL — ABNORMAL LOW (ref 4.22–5.81)
RDW: 15.3 % (ref 11.5–15.5)
WBC: 10.6 10*3/uL — ABNORMAL HIGH (ref 4.0–10.5)
nRBC: 0 % (ref 0.0–0.2)

## 2018-03-14 LAB — COMPREHENSIVE METABOLIC PANEL
ALT: 11 U/L (ref 0–44)
ANION GAP: 10 (ref 5–15)
AST: 18 U/L (ref 15–41)
Albumin: 2.6 g/dL — ABNORMAL LOW (ref 3.5–5.0)
Alkaline Phosphatase: 40 U/L (ref 38–126)
BILIRUBIN TOTAL: 0.5 mg/dL (ref 0.3–1.2)
BUN: 20 mg/dL (ref 8–23)
CO2: 22 mmol/L (ref 22–32)
Calcium: 8.5 mg/dL — ABNORMAL LOW (ref 8.9–10.3)
Chloride: 107 mmol/L (ref 98–111)
Creatinine, Ser: 1.02 mg/dL (ref 0.61–1.24)
GFR calc non Af Amer: >60 mL/min (ref >60)
Glucose, Bld: 125 mg/dL — ABNORMAL HIGH (ref 70–99)
POTASSIUM: 4.6 mmol/L (ref 3.5–5.1)
Sodium: 139 mmol/L (ref 135–145)
TOTAL PROTEIN: 6.5 g/dL (ref 6.5–8.1)

## 2018-03-14 LAB — TROPONIN I: Troponin I: <0.03 ng/mL (ref <0.03)

## 2018-03-14 LAB — D-DIMER, QUANTITATIVE (NOT AT ARMC): D DIMER QUANT: 3.13 ug{FEU}/mL — AB (ref 0.00–0.50)

## 2018-03-14 LAB — PROCALCITONIN: Procalcitonin: <0.1 ng/mL

## 2018-03-14 LAB — STREP PNEUMONIAE URINARY ANTIGEN: Strep Pneumo Urinary Antigen: NEGATIVE

## 2018-03-14 LAB — BRAIN NATRIURETIC PEPTIDE: B Natriuretic Peptide: 64.7 pg/mL (ref 0.0–100.0)

## 2018-03-14 LAB — INFLUENZA PANEL BY PCR (TYPE A & B)
Influenza A By PCR: NEGATIVE
Influenza B By PCR: NEGATIVE

## 2018-03-14 MED ORDER — HEPARIN SODIUM (PORCINE) 5000 UNIT/ML IJ SOLN
5000.0000 [IU] | Freq: Three times a day (TID) | INTRAMUSCULAR | Status: DC
  Administered 2018-03-14 – 2018-03-16 (×7): 5000 [IU] via SUBCUTANEOUS
  Filled 2018-03-14 (×7): qty 1

## 2018-03-14 MED ORDER — ACETAMINOPHEN 325 MG PO TABS: 650.0000 mg | ORAL_TABLET | Freq: Four times a day (QID) | ORAL | Status: DC | PRN

## 2018-03-14 MED ORDER — TRAMADOL HCL 50 MG PO TABS
50.0000 mg | ORAL_TABLET | Freq: Four times a day (QID) | ORAL | Status: DC | PRN
  Administered 2018-03-15: 50 mg via ORAL
  Filled 2018-03-14: qty 1

## 2018-03-14 MED ORDER — ACETAMINOPHEN 650 MG RE SUPP: 650.0000 mg | Freq: Four times a day (QID) | RECTAL | Status: DC | PRN

## 2018-03-14 MED ORDER — SODIUM CHLORIDE 0.9% FLUSH
3.0000 mL | Freq: Two times a day (BID) | INTRAVENOUS | Status: DC
  Administered 2018-03-15 (×2): 3 mL via INTRAVENOUS

## 2018-03-14 MED ORDER — METHYLPREDNISOLONE SODIUM SUCC 125 MG IJ SOLR: 48.0000 mg | Freq: Once | INTRAMUSCULAR | Status: DC

## 2018-03-14 MED ORDER — SODIUM CHLORIDE 0.9% FLUSH
3.0000 mL | Freq: Two times a day (BID) | INTRAVENOUS | Status: DC
  Administered 2018-03-16: 3 mL via INTRAVENOUS

## 2018-03-14 MED ORDER — FAMOTIDINE 20 MG PO TABS
20.0000 mg | ORAL_TABLET | Freq: Two times a day (BID) | ORAL | Status: DC
  Administered 2018-03-14 – 2018-03-17 (×6): 20 mg via ORAL
  Filled 2018-03-14 (×6): qty 1

## 2018-03-14 MED ORDER — IOPAMIDOL (ISOVUE-370) INJECTION 76%
100.0000 mL | Freq: Once | INTRAVENOUS | Status: AC | PRN
  Administered 2018-03-14: 100 mL via INTRAVENOUS

## 2018-03-14 MED ORDER — SODIUM CHLORIDE 0.9 % IV SOLN
250.0000 mL | INTRAVENOUS | Status: DC | PRN
  Administered 2018-03-14: 250 mL via INTRAVENOUS

## 2018-03-14 MED ORDER — DIPHENHYDRAMINE HCL 25 MG PO CAPS
25.0000 mg | ORAL_CAPSULE | Freq: Every evening | ORAL | Status: DC | PRN
  Administered 2018-03-14 – 2018-03-15 (×2): 25 mg via ORAL
  Filled 2018-03-14 (×2): qty 1

## 2018-03-14 MED ORDER — LEVOFLOXACIN IN D5W 750 MG/150ML IV SOLN
750.0000 mg | INTRAVENOUS | Status: DC
  Administered 2018-03-14 – 2018-03-15 (×2): 750 mg via INTRAVENOUS
  Filled 2018-03-14 (×2): qty 150

## 2018-03-14 MED ORDER — PREDNISOLONE ACETATE 1 % OP SUSP
1.0000 [drp] | Freq: Four times a day (QID) | OPHTHALMIC | Status: DC
  Administered 2018-03-14 – 2018-03-17 (×11): 1 [drp] via OPHTHALMIC
  Filled 2018-03-14: qty 5

## 2018-03-14 MED ORDER — ONDANSETRON HCL 4 MG/2ML IJ SOLN
4.0000 mg | Freq: Four times a day (QID) | INTRAMUSCULAR | Status: DC | PRN
  Administered 2018-03-15: 4 mg via INTRAVENOUS
  Filled 2018-03-14: qty 2

## 2018-03-14 MED ORDER — ONDANSETRON HCL 4 MG PO TABS
4.0000 mg | ORAL_TABLET | Freq: Four times a day (QID) | ORAL | Status: DC | PRN
  Administered 2018-03-15 – 2018-03-16 (×2): 4 mg via ORAL
  Filled 2018-03-14 (×2): qty 1

## 2018-03-14 MED ORDER — LEVALBUTEROL HCL 0.63 MG/3ML IN NEBU
0.6300 mg | INHALATION_SOLUTION | Freq: Four times a day (QID) | RESPIRATORY_TRACT | Status: DC
  Administered 2018-03-14 – 2018-03-17 (×13): 0.63 mg via RESPIRATORY_TRACT
  Filled 2018-03-14 (×13): qty 3

## 2018-03-14 MED ORDER — SODIUM CHLORIDE 0.9% FLUSH: 3.0000 mL | INTRAVENOUS | Status: DC | PRN

## 2018-03-14 MED ORDER — SENNOSIDES-DOCUSATE SODIUM 8.6-50 MG PO TABS: 1.0000 | ORAL_TABLET | Freq: Every evening | ORAL | Status: DC | PRN

## 2018-03-14 MED ORDER — SODIUM CHLORIDE 0.9% FLUSH: 10.0000 mL | INTRAVENOUS | Status: DC | PRN

## 2018-03-14 MED ORDER — METHYLPREDNISOLONE SODIUM SUCC 125 MG IJ SOLR
80.0000 mg | Freq: Three times a day (TID) | INTRAMUSCULAR | Status: DC
  Administered 2018-03-14 – 2018-03-15 (×4): 80 mg via INTRAVENOUS
  Filled 2018-03-14 (×4): qty 2

## 2018-03-14 NOTE — Plan of Care (Signed)

## 2018-03-14 NOTE — Assessment & Plan Note (Signed)
Hospital admission 03/14/2018 for acute resp failure, O2 sat 83% RA at rest. Requiring 3L oxygen

## 2018-03-14 NOTE — Progress Notes (Signed)
Pharmacy Antibiotic Note  Alexander Ellis is a 72 y.o. male admitted on 03/14/2018 with pneumonia.  Pharmacy has been consulted for levofloxacin dosing.  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   Today, 03/14/18  WBC 10.6  PCT < 0.10  Afebrile  Plan:  Levofloxacin 750 mg IV q24h  Follow renal function, clinical course, and culture data    Temp (24hrs), Avg:97.4 F (36.3 C), Min:97.3 F (36.3 C), Max:97.4 F (36.3 C)  Recent Labs  Lab 03/14/18 1415  WBC 10.6*  CREATININE 1.02    Estimated Creatinine Clearance: 51.8 mL/min (by C-G formula based on SCr of 1.02 mg/dL).    Allergies  Allergen Reactions  . Oxycodone Other (See Comments)    somnolence Sleep all the time  . Penicillins Anaphylaxis, Hives and Rash    Has patient had a PCN reaction causing immediate rash, facial/tongue/throat swelling, SOB or lightheadedness with hypotension: Yes Has patient had a PCN reaction causing severe rash involving mucus membranes or skin necrosis: No Has patient had a PCN reaction that required hospitalization: No Has patient had a PCN reaction occurring within the last 10 years: Yes If all of the above answers are "NO", then may proceed with Cephalosporin use.   . Codeine Nausea And Vomiting  . Other Nausea Only and Other (See Comments)    Uncoded Allergy. Allergen: IV contrast  . Hydrocodone-Acetaminophen Nausea And Vomiting  . Iohexol Nausea And Vomiting    Antimicrobials this admission: levofloxacin 10/22 >>   Dose adjustments this admission:  Microbiology results: 10/22 BCx: Sent 10/22 UCx: Sent  10/22 Sputum: Sent  10/22 MRSA PCR: Sent  Thank you for allowing pharmacy to be a part of this patient's care.  Lenis Noon, PharmD Clinical Pharmacist 03/14/2018 5:20 PM

## 2018-03-14 NOTE — Progress Notes (Signed)
@Patient  ID: Alexander Ellis, male    DOB: 05-21-1946, 72 y.o.   MRN: [id number]  Chief Complaint  Patient presents with  . Acute Visit    SOB x3-4weeks    Referring provider: [name], NP  HPI: 72 year old male, former smoker (quit 11/2017). PMH pulmonary infiltrates, prostate cancer mets to bones (followed by Dr. Marin Olp, previously on Apalutamide). Chest CT in May 2019 showed evidence of ILD. Patient of Dr. Melvyn Novas, last seen 01/12/18 for dyspnea. Trial high dose prednisone and off Apalutamide in July 2019 and re-challenged in Aug x 1 week.   03/14/2018 Patient presents today for acute visit with complaints of worsening shortness of breath over the last 4 weeks. Today in office his oxygen level was 83% RA. Oxygen placed, sating 90-94% 3L. Chemotherapy stopped on Oct 3rd. Currently on 20mg  prednisone daily. Denies chest pain, wheezing, cough.    Testing reviewed: CTA Chest 09/22/2017-  Underlying subpleural reticulation in the lungs with a lower lung predominance. Imaging features suggest component of fibrotic lung disease. Stable appearance of mediastinal lymphadenopathy, bilateral pulmonary nodules, and left adrenal nodule. Widespread sclerotic bony metastatic disease.  PFT's  01/12/2018  FEV1 2.37 (79 % ) ratio 78  p 1 % improvement from saba p nothing prior to study with DLCO  14/16c % corrects to 27 % for alv volume    Allergies  Allergen Reactions  . Oxycodone Other (See Comments)    somnolence Sleep all the time  . Penicillins Anaphylaxis, Hives and Rash    Has patient had a PCN reaction causing immediate rash, facial/tongue/throat swelling, SOB or lightheadedness with hypotension: Yes Has patient had a PCN reaction causing severe rash involving mucus membranes or skin necrosis: No Has patient had a PCN reaction that required hospitalization: No Has patient had a PCN reaction occurring within the last 10 years: Yes If all of the above answers are "NO", then may proceed with  Cephalosporin use.   . Codeine Nausea And Vomiting  . Other Nausea Only and Other (See Comments)    Uncoded Allergy. Allergen: IV contrast  . Hydrocodone-Acetaminophen Nausea And Vomiting  . Iohexol Nausea And Vomiting    Immunization History  Administered Date(s) Administered  . Influenza, High Dose Seasonal PF 03/16/2017  . Pneumococcal Conjugate-13 07/15/2014  . Pneumococcal Polysaccharide-23 05/25/1999    Past Medical History:  Diagnosis Date  . Anemia   . Aneurysm of abdominal aorta (HCC)   . Bone metastasis ([place])   . Cancer [facility]) 2014   prostate  . Chronic hepatitis B virus infection ([place])   . Cirrhosis ([place])   . COPD (chronic obstructive pulmonary disease) ([place])   . Dyspnea   . GERD (gastroesophageal reflux disease)   . GERD (gastroesophageal reflux disease)   . Hand pain   . Hepatitis   . Hypotension   . Lung nodules   . Osteoarthritis     Tobacco History: Social History   Tobacco Use  Smoking Status Former Smoker  . Packs/day: 0.50  . Years: 60.00  . Pack years: 30.00  . Types: Cigarettes  . Last attempt to quit: 12/20/2017  . Years since quitting: 0.2  Smokeless Tobacco Never Used   Counseling given: Not Answered   Facility-Administered Medications Prior to Visit  Medication Dose Route Frequency Provider Last Rate Last Dose  . sodium chloride flush (NS) 0.9 % injection 10 mL  10 mL Intravenous PRN Volanda Napoleon, MD   10 mL at 11/16/17 1336   Outpatient  Medications Prior to Visit  Medication Sig Dispense Refill  . chlorhexidine (PERIDEX) 0.12 % solution Use as directed 15 mLs in the mouth or throat 2 (two) times daily.    . dorzolamide-timolol (COSOPT) 22.3-6.8 MG/ML ophthalmic solution Place 1 drop into the left eye 2 times daily.    . Lidocaine 2 % GEL Apply topically. Apply to gums 4 x days with a qtip    . lidocaine-prilocaine (EMLA) cream Apply 1 application topically as needed. Apply to port 1-2 hours before coming for treatment.  1/2 tbsp  applied but do not rub in.  Place plastic covering over cream. 30 g 2  . ondansetron (ZOFRAN) 8 MG tablet Take 1 tablet (8 mg total) by mouth 2 (two) times daily as needed for refractory nausea / vomiting. 30 tablet 1  . prednisoLONE acetate (PRED FORTE) 1 % ophthalmic suspension Place 1 drop into the left eye 4 times daily.    . predniSONE (DELTASONE) 10 MG tablet Take 1 tablet (10 mg total) by mouth daily with breakfast. (Patient taking differently: Take 20 mg by mouth daily with breakfast. ) 90 tablet 0  . Probiotic Product (PROBIOTIC PO) Take 1 capsule by mouth daily.    . prochlorperazine (COMPAZINE) 10 MG tablet Take 10 mg by mouth every 6 (six) hours as needed for nausea or vomiting.    . ranitidine (ZANTAC) 300 MG tablet Take 300 mg by mouth daily as needed for heartburn.     . traMADol (ULTRAM) 50 MG tablet Take 1 tablet (50 mg total) by mouth every 6 (six) hours as needed. (Patient not taking: Reported on 03/14/2018) 30 tablet 0  . albuterol (PROVENTIL HFA;VENTOLIN HFA) 108 (90 Base) MCG/ACT inhaler INAHLE 1 PUFF BY MOUTH Q 4 H PRN  0  . albuterol (PROVENTIL) (2.5 MG/3ML) 0.083% nebulizer solution USE 1 VIAL IN NEBULIZER EVERY 4 HOURS  0  . apalutamide (ERLEADA) 60 MG tablet Take 4 tablets (240 mg total) by mouth daily. May be taken with or without food. Swallow tablets whole. (Patient not taking: Reported on 03/14/2018) 120 tablet 3  . AZOPT 1 % ophthalmic suspension INSTILL 1 DROP TWICE A DAY INTO both eyes-reported 10/26/17  0  . clindamycin (CLEOCIN) 300 MG capsule Take 300 mg by mouth 3 (three) times daily.  0  . COMBIGAN 0.2-0.5 % ophthalmic solution PLACE 1 DROP IN EACH EYE TWICE (2) DAILY  4  . erythromycin (ERY-TAB) 250 MG EC tablet Take 250 mg by mouth 2 (two) times daily.    . fludrocortisone (FLORINEF) 0.1 MG tablet Take 1 tablet (0.1 mg total) by mouth daily. (Patient not taking: Reported on 03/14/2018) 30 tablet 5  . HYDROcodone-acetaminophen (NORCO) 7.5-325 MG tablet Take 1-2  tablets by mouth every 4 (four) hours as needed. for pain  0  . levofloxacin (LEVAQUIN) 750 MG tablet Take 1 tablet (750 mg total) by mouth daily. (Patient not taking: Reported on 01/12/2018) 7 tablet 0    Review of Systems  Review of Systems  Constitutional: Negative.   HENT: Negative.   Respiratory: Positive for shortness of breath. Negative for cough and wheezing.   Cardiovascular: Negative for chest pain, palpitations and leg swelling.    Physical Exam  BP 110/70 (BP Location: Right Arm, Cuff Size: Normal)   Pulse 97   Temp (!) 97.3 F (36.3 C)   Ht 5\' 9"  (1.753 m)   Wt 127 lb (57.6 kg)   SpO2 (!) 83%   BMI 18.75 kg/m  Physical Exam  Constitutional: He is oriented to person, place, and time. He appears well-developed and well-nourished. He appears distressed.  HENT:  Head: Normocephalic and atraumatic.  Neck: Normal range of motion. Neck supple.  Cardiovascular: Normal rate and regular rhythm.  Pulmonary/Chest: No stridor. He is in respiratory distress. He has no wheezes. He has rales.  Coarse crackles bil bases. Increased resp rate. O2 sat 83% RA, 90-94% 3L. Clubbing nails.   Musculoskeletal:  In [facility]  Neurological: He is alert and oriented to person, place, and time.     Lab Results:  CBC    Component Value Date/Time   WBC 8.6 01/04/2018 0832   WBC 7.8 12/14/2017 2012   RBC 3.10 (L) 01/04/2018 0832   HGB 10.2 (L) 01/04/2018 0832   HGB 12.7 (L) 10/09/2015 1204   HCT 31.4 (L) 01/04/2018 0832   HCT 37.8 (L) 10/09/2015 1204   PLT 225 01/04/2018 0832   PLT 243 10/09/2015 1204   MCV 101.3 (H) 01/04/2018 0832   MCV 97.5 10/09/2015 1204   MCH 32.9 01/04/2018 0832   MCHC 32.5 01/04/2018 0832   RDW 18.7 (H) 01/04/2018 0832   RDW 15.7 (H) 10/09/2015 1204   LYMPHSABS 1.0 01/04/2018 0832   LYMPHSABS 0.9 10/09/2015 1204   MONOABS 0.7 01/04/2018 0832   MONOABS 0.7 10/09/2015 1204   EOSABS 0.1 01/04/2018 0832   EOSABS 0.1 10/09/2015 1204   EOSABS 0.1 06/06/2015 1156     BASOSABS 0.0 01/04/2018 0832   BASOSABS 0.0 10/09/2015 1204    BMET    Component Value Date/Time   NA 138 01/04/2018 0832   NA 147 (H) 06/06/2015 1156   K 4.0 01/04/2018 0832   K 5.4 (H) 06/06/2015 1156   CL 105 01/04/2018 0832   CL 106 06/06/2015 1156   CO2 27 01/04/2018 0832   CO2 26 06/06/2015 1156   GLUCOSE 124 (H) 01/04/2018 0832   GLUCOSE 92 06/06/2015 1156   BUN 15 01/04/2018 0832   BUN 18 06/06/2015 1156   CREATININE 1.10 01/04/2018 0832   CREATININE 1.1 06/06/2015 1156   CALCIUM 9.2 01/04/2018 0832   CALCIUM 9.6 06/06/2015 1156   GFRNONAA 50 (L) 12/14/2017 2012   GFRAA 58 (L) 12/14/2017 2012    BNP    Component Value Date/Time   BNP 32.9 12/14/2017 2012    ProBNP No results found for: PROBNP  Imaging: No results found.   Assessment & Plan:   Respiratory failure [facility]) Hospital admission 03/14/2018 for acute resp failure, O2 sat 83% RA at rest. Requiring 3L oxygen    Martyn Ehrich, NP 03/14/2018

## 2018-03-14 NOTE — H&P (Signed)
NAME:  Alexander Ellis, MRN:  [id number], DOB:  [birth year], LOS: 0 ADMISSION DATE:  03/14/2018 CHIEF COMPLAINT:  Shortness of breath    Brief History   72 year old male, former smoker (quit 11/2017). 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 Marin Olp, on Apalutamide). Chest CT in May 2019 showed evidence of ILD. Patient of Dr. Melvyn Novas, last seen 01/12/18 for dyspnea. Trial high dose prednisone and off Apalutamide in July 2019 and re-challenged in Aug x 1 week.   Patient presents today for acute visit with complaints of worsening shortness of breath over the last 4 weeks. Wife states his oxygen level was in the 50's this morning when he woke up. Today in office his oxygen level was 83% RA. Oxygen placed, 90-94% 3L. Hx dyspnea, he has been short of breath since he's been on oral chemotherapy/Apalutamide. Trial high dose prednisone and off Apalutamide in July 2019 and re-challenged in Aug x 1 week. Saw Dr. Marin Olp and was put back on Apalutamide. Changed oncology providers recently to Dr. Hinton Rao with [facility] in [place], stopped chemotherpay on Oct 3rd. Currently on 20mg  prednisone daily. He can not tell a different with prednisone. Tried albuterol, stopped because it gave him chest pain. Denies current chest pain, wheezing, cough, URI symptoms. Afebrile.    Significant testing reviewed: CTA Chest 09/22/2017- no evidence for acute pulmonary embolus. Prominence of the main pulmonary arteries suggests pulmonary arterial hypertension. Underlying subpleural reticulation in the lungs with a lower lung predominance. Imaging features suggest component of fibrotic lung disease. Stable appearance of mediastinal lymphadenopathy, bilateral pulmonary nodules, and left adrenal nodule. Widespread sclerotic bony metastatic disease.  PFT's 01/12/2018 - FEV1 2.37 (79 % ) ratio 78 p 1 % improvement from saba p nothing prior to study with DLCO  14/16c % corrects to 27 % for alv volume   01/12/2018 Walked RA x 3 laps normal pace, no sob or desa   Past Medical History  COPD, ILD, pulm nodules, metastatic prostate cancer, GERD,  hypotension, dyspnea, hepatitis, osteoarthritis, cirrhosis, anemia, aneurysm of abdominal aorta  Significant Hospital Events    Consults: date of consult/date signed off & final recs:   Procedures (surgical and bedside):   Significant Diagnostic Tests:   Micro Data:   Antimicrobials:   Subjective:  SOB x 4 weeks, O2 sat 83% RA   Objective    BP 110/70 (BP Location: Right Arm, Cuff Size: Normal)   Pulse 97   Temp (!) 97.3 F (36.3 C)   Ht 5\' 9"  (1.753 m)   Wt 127 lb (57.6 kg)   SpO2 (!) 83%   BMI 18.75 kg/m   Examination: General: He is oriented to person, place, and time. He appears well-developed and well-nourished. He appears distressed HENT: Normocephalic and atraumatic.  Lungs: No stridor. He is in respiratory distress. He has no wheezes. He has coarse crackles bil bases. Increased resp rate. O2 sat 83% RA, 90-94% 3L. Clubbing nails.   Cardiovascular: Regular rate and rhythm  Abdomen: SNT Extremities: Clubbing of finger nails, cyanosis to nail beds  Neuro: Alert and oriented, appears anxious  Resolved Hospital Problem list    Assessment & Plan:  72 year old gentleman with significant past medical history of COPD, ILD and metastatic prostate cancer. CTA in may showed underlying subpleural reticulation in the lungs with a lower lung predominance suggestive of fibrotic lung disease. Stable appearance of mediastinal lymphadenopathy, bilateral pulmonary nodules, and left adrenal nodule.  Widespread sclerotic bony metastatic disease.  Presented today with acute respiratory failure, O2 sat 83% RA. Oxygen placed in office, currently on 3L sating 90-94%. Discussed patient with Dr. Melvyn Novas, felt patient needed to be admitted to [facility]. for further work up and observation of respiratory status.  CXR, BNP, d-dimer, trop and ABGs pending. Will treat for possible COPD/ILD exacerbation with schedule solu-medrol and xopenex neb treatments. Holding off on abx at this time, no signs of bacterial infection in office. Denies cough, URI symptoms, wheeze. Afebrile.   Acute Respiratory failure with hypoxia - New oxygen requirement, unclear reason. Possibly d/t COPD exacerbation or worsening of ILD - He is alert and oriented in office. Maintaining O2 level >90% on 3L. May need BIPAP once ABGs resulted - CXR, BNP, Ddimer, troponin and ABGs pending   COPD/ILD exacerbation - Solu-medrol 80mg  q8 hrs - Xopenex neb treatment q6 hrs  Metastatic prostate cancer  - Followed by Dr. Anabel Bene with [facility] in [place] - Not currently on chemotherapy, stopped Apalutamide on October 3rd - CTA Chest 09/22/2017 showed mediastinal lymphadenopathy and pulmonary nodules. Widespread sclerotic bony metastatic disease.    Disposition / Summary of Today's Plan 03/14/18   Hospital admission for acute respiratory failure with hypoxia. Work up pending     Diet: Regular Pain/Anxiety/Delirium protocol (if indicated): Tramadol q6hrs prn pain VAP protocol (if indicated): N/A DVT prophylaxis: Heparin [place] and SCD's GI prophylaxis: Famotidine BID Hyperglycemia protocol: Fasting blood glucose, notify physician if >95 Mobility: Up with assistance  Code Status: Full Family Communication: Wife   Labs   CBC: No results for input(s): WBC, NEUTROABS, HGB, HCT, MCV, PLT in the last 168 hours.  Basic Metabolic Panel: No results for input(s): NA, K, CL, CO2, GLUCOSE, BUN, CREATININE, CALCIUM, MG, PHOS in the last 168 hours. GFR: CrCl cannot be calculated (Patient's most recent lab result is older than the maximum 21 days allowed.). No results for input(s): PROCALCITON, WBC, LATICACIDVEN in the last 168 hours.  Liver Function Tests: No results for input(s): AST, ALT, ALKPHOS, BILITOT, PROT, ALBUMIN in the  last 168 hours. No results for input(s): LIPASE, AMYLASE in the last 168 hours. No results for input(s): AMMONIA in the last 168 hours.  ABG    Component Value Date/Time   TCO2 22 07/19/2014 1317     Coagulation Profile: No results for input(s): INR, PROTIME in the last 168 hours.  Cardiac Enzymes: No results for input(s): CKTOTAL, CKMB, CKMBINDEX, TROPONINI in the last 168 hours.  HbA1C: No results found for: HGBA1C  CBG: No results for input(s): GLUCAP in the last 168 hours.  Admitting History of Present Illness.   72 year old male hx COPD, ILD and metastatic prostate cancer being admitted for acute respiratory failure with hypoxia newly requiring oxygen.   Review of Systems:   Constitutional: Negative.   HENT: Negative.   Respiratory: Positive for shortness of breath. Negative for cough and wheezing.   Cardiovascular: Negative for chest pain, palpitations and leg swelling.   Past Medical History  He,  has a past medical history of Anemia, Aneurysm of abdominal aorta ([place]), Bone metastasis ([place]), Cancer ([place]) (2014), Chronic hepatitis B virus infection ([place]), Cirrhosis ([place]), COPD (chronic obstructive pulmonary disease) ([place]), Dyspnea, GERD (gastroesophageal reflux disease), GERD (gastroesophageal reflux disease), Hand pain, Hepatitis, Hypotension, Lung nodules, and Osteoarthritis.   Surgical History    Past Surgical History:  Procedure Laterality Date  . APPENDECTOMY    . BACK SURGERY     [phone]  .  IR FLUORO GUIDE PORT INSERTION RIGHT  09/06/2017  . IR US GUIDE VASC ACCESS RIGHT  09/06/2017  . PROSTATECTOMY  2012  . THROAT SURGERY    . TONSILLECTOMY       Social History   Social History   Socioeconomic History  . Marital status: Married    Spouse name: Not on file  . Number of children: Not on file  . Years of education: Not on file  . Highest education level: Not on file  Occupational History  . Not on file  Social Needs  . Financial  resource strain: Not on file  . Food insecurity:    Worry: Not on file    Inability: Not on file  . Transportation needs:    Medical: Not on file    Non-medical: Not on file  Tobacco Use  . Smoking status: Former Smoker    Packs/day: 0.50    Years: 60.00    Pack years: 30.00    Types: Cigarettes    Last attempt to quit: 12/20/2017    Years since quitting: 0.2  . Smokeless tobacco: Never Used  Substance and Sexual Activity  . Alcohol use: No    Alcohol/week: 0.0 standard drinks  . Drug use: No  . Sexual activity: Not on file  Lifestyle  . Physical activity:    Days per week: Not on file    Minutes per session: Not on file  . Stress: Not on file  Relationships  . Social connections:    Talks on phone: Not on file    Gets together: Not on file    Attends religious service: Not on file    Active member of club or organization: Not on file    Attends meetings of clubs or organizations: Not on file    Relationship status: Not on file  . Intimate partner violence:    Fear of current or ex partner: Not on file    Emotionally abused: Not on file    Physically abused: Not on file    Forced sexual activity: Not on file  Other Topics Concern  . Not on file  Social History Narrative  . Not on file  ,  reports that he quit smoking about 2 months ago. His smoking use included cigarettes. He has a 30.00 pack-year smoking history. He has never used smokeless tobacco. He reports that he does not drink alcohol or use drugs.   Family History   His family history includes Emphysema in his father; Heart disease in his mother; Hypertension in his mother.   Allergies Allergies  Allergen Reactions  . Oxycodone Other (See Comments)    somnolence Sleep all the time  . Penicillins Hives and Rash  . Codeine Nausea And Vomiting  . Other Nausea Only and Other (See Comments)    Uncoded Allergy. Allergen: IV contrast  . Hydrocodone-Acetaminophen Nausea And Vomiting  . Iohexol Nausea And  Vomiting     Home Medications  Prior to Admission medications   Medication Sig Start Date End Date Taking? Authorizing Provider  albuterol (PROVENTIL HFA;VENTOLIN HFA) 108 (90 Base) MCG/ACT inhaler INAHLE 1 PUFF BY MOUTH Q 4 H PRN 02/23/18   [provider]  albuterol (PROVENTIL) (2.5 MG/3ML) 0.083% nebulizer solution USE 1 VIAL IN NEBULIZER EVERY 4 HOURS 02/23/18   [provider]  apalutamide (ERLEADA) 60 MG tablet Take 4 tablets (240 mg total) by mouth daily. May be taken with or without food. Swallow tablets whole. Patient not taking: Reported  on 03/14/2018 01/13/18   Volanda Napoleon, MD  AZOPT 1 % ophthalmic suspension INSTILL 1 DROP TWICE A DAY INTO both eyes-reported 10/26/17 08/11/17   [provider]  chlorhexidine (PERIDEX) 0.12 % solution Use as directed 15 mLs in the mouth or throat 2 (two) times daily.    [provider]  clindamycin (CLEOCIN) 300 MG capsule Take 300 mg by mouth 3 (three) times daily. 12/02/17   [provider]  COMBIGAN 0.2-0.5 % ophthalmic solution PLACE 1 DROP IN EACH EYE TWICE (2) DAILY 08/13/17   [provider]  dorzolamide-timolol (COSOPT) 22.3-6.8 MG/ML ophthalmic solution Place 1 drop into the left eye 2 times daily. 03/08/18 03/08/19  [provider]  erythromycin (ERY-TAB) 250 MG EC tablet Take 250 mg by mouth 2 (two) times daily.    [provider]  fludrocortisone (FLORINEF) 0.1 MG tablet Take 1 tablet (0.1 mg total) by mouth daily. Patient not taking: Reported on 03/14/2018 01/04/18   Volanda Napoleon, MD  HYDROcodone-acetaminophen (NORCO) 7.5-325 MG tablet Take 1-2 tablets by mouth every 4 (four) hours as needed. for pain 12/08/17   [provider]  levofloxacin (LEVAQUIN) 750 MG tablet Take 1 tablet (750 mg total) by mouth daily. Patient not taking: Reported on 01/12/2018 12/14/17   Montine Circle, PA-C  Lidocaine 2 % GEL Apply topically. Apply to gums 4 x days with a qtip     [provider]  lidocaine-prilocaine (EMLA) cream Apply 1 application topically as needed. Apply to port 1-2 hours before coming for treatment.  1/2 tbsp applied but do not rub in.  Place plastic covering over cream. 09/08/17   Volanda Napoleon, MD  ondansetron (ZOFRAN) 8 MG tablet Take 1 tablet (8 mg total) by mouth 2 (two) times daily as needed for refractory nausea / vomiting. 02/27/18   Volanda Napoleon, MD  prednisoLONE acetate (PRED FORTE) 1 % ophthalmic suspension Place 1 drop into the left eye 4 times daily. 02/01/18   [provider]  predniSONE (DELTASONE) 10 MG tablet Take 1 tablet (10 mg total) by mouth daily with breakfast. 02/20/18   Volanda Napoleon, MD  Probiotic Product (PROBIOTIC PO) Take 1 capsule by mouth daily.    [provider]  prochlorperazine (COMPAZINE) 10 MG tablet Take 10 mg by mouth every 6 (six) hours as needed for nausea or vomiting.    [provider]  ranitidine (ZANTAC) 300 MG tablet Take 300 mg by mouth daily as needed for heartburn.  06/01/17   [provider]  traMADol (ULTRAM) 50 MG tablet Take 1 tablet (50 mg total) by mouth every 6 (six) hours as needed. 11/03/17   Volanda Napoleon, MD     Critical care time:      Geraldo Pitter, NP-BC [facility]

## 2018-03-15 LAB — PROCALCITONIN: Procalcitonin: <0.1 ng/mL

## 2018-03-15 LAB — CBC
HEMATOCRIT: 28.3 % — AB (ref 39.0–52.0)
Hemoglobin: 8.7 g/dL — ABNORMAL LOW (ref 13.0–17.0)
MCH: 33 pg (ref 26.0–34.0)
MCHC: 30.7 g/dL (ref 30.0–36.0)
MCV: 107.2 fL — ABNORMAL HIGH (ref 80.0–100.0)
NRBC: 0 % (ref 0.0–0.2)
PLATELETS: 234 10*3/uL (ref 150–400)
RBC: 2.64 MIL/uL — AB (ref 4.22–5.81)
RDW: 15.1 % (ref 11.5–15.5)
WBC: 8.8 10*3/uL (ref 4.0–10.5)

## 2018-03-15 LAB — RESPIRATORY PANEL BY PCR
ADENOVIRUS-RVPPCR: NOT DETECTED
Bordetella pertussis: NOT DETECTED
CORONAVIRUS 229E-RVPPCR: NOT DETECTED
Chlamydophila pneumoniae: NOT DETECTED
Coronavirus HKU1: NOT DETECTED
Coronavirus NL63: NOT DETECTED
Coronavirus OC43: NOT DETECTED
Influenza A: NOT DETECTED
Influenza B: NOT DETECTED
METAPNEUMOVIRUS-RVPPCR: NOT DETECTED
Mycoplasma pneumoniae: NOT DETECTED
PARAINFLUENZA VIRUS 3-RVPPCR: NOT DETECTED
Parainfluenza Virus 1: NOT DETECTED
Parainfluenza Virus 2: NOT DETECTED
Parainfluenza Virus 4: NOT DETECTED
Respiratory Syncytial Virus: NOT DETECTED
Rhinovirus / Enterovirus: NOT DETECTED

## 2018-03-15 LAB — BASIC METABOLIC PANEL
ANION GAP: 11 (ref 5–15)
BUN: 22 mg/dL (ref 8–23)
CALCIUM: 8.6 mg/dL — AB (ref 8.9–10.3)
CHLORIDE: 106 mmol/L (ref 98–111)
CO2: 21 mmol/L — ABNORMAL LOW (ref 22–32)
CREATININE: 1.08 mg/dL (ref 0.61–1.24)
GFR calc non Af Amer: >60 mL/min (ref >60)
Glucose, Bld: 134 mg/dL — ABNORMAL HIGH (ref 70–99)
Potassium: 5 mmol/L (ref 3.5–5.1)
SODIUM: 138 mmol/L (ref 135–145)

## 2018-03-15 LAB — TROPONIN I: Troponin I: <0.03 ng/mL (ref <0.03)

## 2018-03-15 LAB — EXPECTORATED SPUTUM ASSESSMENT W REFEX TO RESP CULTURE

## 2018-03-15 LAB — EXPECTORATED SPUTUM ASSESSMENT W GRAM STAIN, RFLX TO RESP C

## 2018-03-15 MED ORDER — ACETAMINOPHEN 160 MG/5ML PO SOLN: 650.0000 mg | Freq: Four times a day (QID) | ORAL | Status: DC | PRN

## 2018-03-15 MED ORDER — ACETAMINOPHEN 160 MG/5ML PO SOLN
650.0000 mg | Freq: Four times a day (QID) | ORAL | Status: DC | PRN
  Administered 2018-03-15: 650 mg via ORAL
  Filled 2018-03-15: qty 20.3

## 2018-03-15 MED ORDER — MORPHINE SULFATE (PF) 2 MG/ML IV SOLN
0.5000 mg | INTRAVENOUS | Status: DC | PRN
  Administered 2018-03-15 (×2): 0.5 mg via INTRAVENOUS
  Filled 2018-03-15 (×2): qty 1

## 2018-03-15 MED ORDER — METHYLPREDNISOLONE SODIUM SUCC 125 MG IJ SOLR
80.0000 mg | Freq: Two times a day (BID) | INTRAMUSCULAR | Status: DC
  Administered 2018-03-16: 80 mg via INTRAVENOUS
  Filled 2018-03-15: qty 2

## 2018-03-15 NOTE — Progress Notes (Signed)
NAME:  Alexander Ellis, MRN:  [id number], DOB:  Oct 09, 1945, LOS: 1 ADMISSION DATE:  03/14/2018 CHIEF COMPLAINT:  Shortness of breath    Brief History:  72 y/o M, former smoker, admitted to [facility] on 10/22 from the pulmonary office with worsening shortness of breath for 4 weeks and desaturation to 50's on the am of presentation.  Admitted for acute hypoxemic respiratory failure.    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 Marin Olp, on Apalutamide). Chest CT in May 2019 showed evidence of ILD. Patient of Dr. Melvyn Novas, last seen 01/12/18 for dyspnea. Trial high dose prednisone and off Apalutamide in July 2019 and re-challenged in Aug x 1 week.  Saw Dr. Marin Olp and was put back on Apalutamide. Changed oncology providers recently to Dr. Hinton Rao with [facility] in [place], stopped chemotherpay on Oct 3rd. Currently on 20mg  prednisone daily. He can not tell a different with prednisone. Tried albuterol, stopped because it gave him chest pain.    He was placed on 3L O2 with improvement of saturations.       Past Medical History  COPD, ILD, pulm nodules, metastatic prostate cancer, GERD,  hypotension, dyspnea, hepatitis, osteoarthritis, cirrhosis, anemia, aneurysm of abdominal aorta  Significant Hospital Events   10/22  Admit   Consults: date of consult/date signed off & final recs:    Procedures (surgical and bedside):    Significant Diagnostic Tests:  CTA Chest 09/22/2017 >> no evidence for acute pulmonary embolus. Prominence of the main pulmonary arteries suggests pulmonary arterial hypertension. Underlying subpleural reticulation in the lungs with a lower lung predominance. Imaging features suggest component of fibrotic lung disease. Stable appearance of mediastinal lymphadenopathy, bilateral pulmonary nodules, and left adrenal nodule. Widespread sclerotic bony metastatic disease. PFT's 01/12/2018 >> FEV1 2.37  (79 % ) ratio 78 p 1 % improvement from saba p nothing prior to study with DLCO 14/16c % corrects to 27 % for alv volume  01/12/2018 Walked RA x 3 laps normal pace, no sob or desat CTA Chest 10/22 >> negative for PE, progression of metastatic disease in the thorax, progression of right hilar adenopathy with occlusion of the RML bronchus with RML collapse, increase in mediastinal nodes, diffuse osseus metastatic disease including extraossous soft tissue involvement of the spinal canal at T7, T9, RLL perihilar consolidation, advanced emphysema with basilar fibrosis  Micro Data:  RVP 10/22 >> negative  BCx2 10/22 >>  Sputum 10/22 >>   Antimicrobials:  Levaquin 10/22 >>   Subjective:  Pt reports he feels much better with oxygen.  Denies chest pain, cough or significant sputum production.  He was able to cough a sample up for culture.    Objective   Blood pressure 102/72, pulse 77, temperature 97.6 F (36.4 C), temperature source Oral, resp. rate 20, weight 55.5 kg, SpO2 92 %.  Examination: General: frail, cachectic male lying in bed in NAD  HEENT: MM pink/moist, edentulous, left upper gum with cavitation / non-healing wound Neuro: AAOx4, speech clear, MAE  CV: s1s2 rrr, no m/r/g PULM: even/non-labored, lungs bilaterally clear  GG:YIRS, non-tender, bsx4 active  Extremities: warm/dry, no edema  Skin: no rashes or lesions  Resolved Hospital Problem list     Assessment & Plan:   Acute Respiratory Failure with Hypoxia - new O2 requirement, suspect secondary to RML volume loss in the setting of metastatic prostate adenocarcinoma superimposed on emphysema & scarring / ILD P: Continue O2 for sats 88-94%  Will need ambulatory O2 assessment prior to discharge  COPD / ILD Exacerbation - not on therapy at home due to intolerance  P: Continue solumedrol 80mg  Q8  PRN xopenex   Metastatic Prostate Cancer  - Followed by Dr. Anabel Bene with [facility] in [place] - Not  currently on chemotherapy, stopped Apalutamide on October 3rd and was told there were no other options for therapy - 07/2017 he had an FOB at [facility] with FNA > positive for metastatic prostate adenocarcinoma - CTA Chest 09/22/2017 showed mediastinal lymphadenopathy and pulmonary nodules. Widespread sclerotic bony metastatic disease. P: Recommend palliative care involvement, family & patient accepting of consult  DNR/DNI in the event of arrest    Disposition / Summary of Today's Plan 03/15/18   Will ask palliative care to see patient for discussion of home care options, additional EOL issues such as pain control, SOB etc.      Diet: Regular Pain/Anxiety/Delirium protocol (if indicated): Tramadol q6hrs prn pain DVT prophylaxis: Heparin [place] and SCD's GI prophylaxis: Famotidine BID Mobility: Up with assistance  Code Status: DNR/DNI Family Communication:  Wife and patient updated on plan of care    Labs   CBC: Recent Labs  Lab 03/14/18 1415 03/15/18 0532  WBC 10.6* 8.8  NEUTROABS 9.0*  --   HGB 9.7* 8.7*  HCT 30.9* 28.3*  MCV 106.6* 107.2*  PLT 264 540    Basic Metabolic Panel: Recent Labs  Lab 03/14/18 1415 03/15/18 0532  NA 139 138  K 4.6 5.0  CL 107 106  CO2 22 21*  GLUCOSE 125* 134*  BUN 20 22  CREATININE 1.02 1.08  CALCIUM 8.5* 8.6*   GFR: Estimated Creatinine Clearance: 47.1 mL/min (by C-G formula based on SCr of 1.08 mg/dL). Recent Labs  Lab 03/14/18 1415 03/15/18 0532  PROCALCITON <0.10 <0.10  WBC 10.6* 8.8    Liver Function Tests: Recent Labs  Lab 03/14/18 1415  AST 18  ALT 11  ALKPHOS 40  BILITOT 0.5  PROT 6.5  ALBUMIN 2.6*   No results for input(s): LIPASE, AMYLASE in the last 168 hours. No results for input(s): AMMONIA in the last 168 hours.  ABG    Component Value Date/Time   PHART 7.495 (H) 03/14/2018 1300   PCO2ART 28.7 (L) 03/14/2018 1300   PO2ART 54.6 (L) 03/14/2018 1300   HCO3 21.9 03/14/2018 1300   TCO2 22 07/19/2014 1317    ACIDBASEDEF 0.2 03/14/2018 1300   O2SAT 87.9 03/14/2018 1300     Coagulation Profile: No results for input(s): INR, PROTIME in the last 168 hours.  Cardiac Enzymes: Recent Labs  Lab 03/14/18 1415 03/14/18 1830 03/15/18 0028  TROPONINI <0.03 <0.03 <0.03    HbA1C: No results found for: HGBA1C  CBG: No results for input(s): GLUCAP in the last 168 hours.    Noe Gens, NP-C [facility] Pgr: [phone] or if no answer [phone] 03/15/2018, 11:18 AM

## 2018-03-15 NOTE — Progress Notes (Signed)
Pt became nauseated after Tramadol administer. Pt vomited 200 ml of yellow emesis. PA-C notified. Med given. SRP, RN

## 2018-03-15 NOTE — Progress Notes (Signed)
Received noted for Infection Prevention, Contact precaution and Droplet precaution is discontinued. Respiratory panel resulted negative per Infection Prevention, Keri. SRP, RN

## 2018-03-16 DIAGNOSIS — Z515 Encounter for palliative care: Secondary | ICD-10-CM

## 2018-03-16 DIAGNOSIS — C799 Secondary malignant neoplasm of unspecified site: Secondary | ICD-10-CM

## 2018-03-16 DIAGNOSIS — K5900 Constipation, unspecified: Secondary | ICD-10-CM

## 2018-03-16 DIAGNOSIS — J9600 Acute respiratory failure, unspecified whether with hypoxia or hypercapnia: Secondary | ICD-10-CM

## 2018-03-16 DIAGNOSIS — J9601 Acute respiratory failure with hypoxia: Secondary | ICD-10-CM

## 2018-03-16 LAB — BASIC METABOLIC PANEL
ANION GAP: 9 (ref 5–15)
BUN: 29 mg/dL — AB (ref 8–23)
CALCIUM: 8.5 mg/dL — AB (ref 8.9–10.3)
CO2: 24 mmol/L (ref 22–32)
CREATININE: 1.22 mg/dL (ref 0.61–1.24)
Chloride: 103 mmol/L (ref 98–111)
GFR calc Af Amer: >60 mL/min (ref >60)
GFR, EST NON AFRICAN AMERICAN: 57 mL/min — AB (ref >60)
GLUCOSE: 106 mg/dL — AB (ref 70–99)
Potassium: 4.8 mmol/L (ref 3.5–5.1)
Sodium: 136 mmol/L (ref 135–145)

## 2018-03-16 LAB — CBC
HEMATOCRIT: 29.3 % — AB (ref 39.0–52.0)
Hemoglobin: 9 g/dL — ABNORMAL LOW (ref 13.0–17.0)
MCH: 32.6 pg (ref 26.0–34.0)
MCHC: 30.7 g/dL (ref 30.0–36.0)
MCV: 106.2 fL — AB (ref 80.0–100.0)
NRBC: 0 % (ref 0.0–0.2)
Platelets: 282 10*3/uL (ref 150–400)
RBC: 2.76 MIL/uL — AB (ref 4.22–5.81)
RDW: 15 % (ref 11.5–15.5)
WBC: 12.7 10*3/uL — ABNORMAL HIGH (ref 4.0–10.5)

## 2018-03-16 LAB — PROCALCITONIN: Procalcitonin: <0.1 ng/mL

## 2018-03-16 LAB — LEGIONELLA PNEUMOPHILA SEROGP 1 UR AG: L. pneumophila Serogp 1 Ur Ag: NEGATIVE

## 2018-03-16 LAB — URINE CULTURE: Culture: <10000 — AB

## 2018-03-16 MED ORDER — SENNOSIDES-DOCUSATE SODIUM 8.6-50 MG PO TABS
1.0000 | ORAL_TABLET | Freq: Every day | ORAL | Status: DC
  Administered 2018-03-16: 1 via ORAL
  Filled 2018-03-16: qty 1

## 2018-03-16 MED ORDER — MORPHINE SULFATE (CONCENTRATE) 10 MG/0.5ML PO SOLN: 5.0000 mg | ORAL | Status: DC | PRN

## 2018-03-16 MED ORDER — METHYLPREDNISOLONE SODIUM SUCC 40 MG IJ SOLR
40.0000 mg | Freq: Two times a day (BID) | INTRAMUSCULAR | Status: DC
  Administered 2018-03-16 – 2018-03-17 (×2): 40 mg via INTRAVENOUS
  Filled 2018-03-16 (×2): qty 1

## 2018-03-16 NOTE — Consult Note (Signed)
Consultation Note Date: 03/16/2018   Patient Name: Alexander Ellis  DOB: December 18, 1945  MRN: [id number]  Age / Sex: 72 y.o., male  PCP: Alexander Blalock, NP Referring Physician: Tanda Rockers, MD  Reason for Consultation: Establishing goals of care and Hospice Evaluation  HPI/Patient Profile: 72 y.o. male  with past medical history of prostate cancer and cirrhosis who was admitted on 03/14/2018 with shortness of breath.   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  He received antibiotics, steroids and was placed on oxygen via nasal canula and feels much better.  Per Oncology Alexander Ellis has no further treatment options for his stage 4 prostate cancer. CT scans done on 10/1 and 10/22 show progression of metastasis in 3 short weeks.  Clinical Assessment and Goals of Care:  I have reviewed medical records including EPIC notes, labs and imaging, received report from the care team, assessed the patient and then met at the bedside along with his wife Alexander Ellis  to discuss diagnosis prognosis, [place], EOL wishes, disposition and options.  I introduced Palliative Medicine as specialized medical care for people living with serious illness. It focuses on providing relief from the symptoms and stress of a serious illness. The goal is to improve quality of life for both the patient and the family.  We discussed a brief life review of the patient. He is quite a Scientist, research (physical sciences). "I'm Arn Medal from Alexander Ellis".  Alexander Ellis obviously enjoys making jokes and laughing.  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  He lives at home with Alexander Ellis his wife of 76 years.  He has a son who lives 49 miles away and a daughter who lives out of state.  He believes in God and is confident and comfortable with is life after death.  As far as functional and  nutritional status he has lost a significant amount of weight recently despite continuing to eat.  He is up and walking but has developed a tendency to become quickly short of breath.  He describes a sharp stabbing pain that develops in his right shoulder blade intermittently.  He describes having a bowel movement once every 1-3 weeks and then "it pours out of me".  We discussed his current illness and what it means in the larger context of his on-going co-morbidities.  Natural disease trajectory and expectations at EOL were discussed.  Alexander Ellis appreciates the Doctor's advice and information however, he does not accept his diagnosis and prognosis.  He tells me he was given 6 months to live 18 years ago when he was diagnosed with liver disease.  He was able to cure his liver by taking milk thistle.  He has recently purchased a supplement from a friend that will cure his prostate cancer.  Alexander Ellis goes on to describe how sensitive he is to medicine.  Often when he has taken medicine he has experienced adverse effects - "they gave me a pill to  sleep last night and I was up all night".  Additionally he believes the chemotherapy and antibiotic he took in the past caused interstitial lung disease.  Consequently he feels he does better with less medical treatment.  I attempted to elicit values and goals of care important to the patient.  He wants to be at home with his wife.  He would like to avoid medical therapy.  We talked about Palliative Care and Hospice - and what services are provided under each.  Both Alexander Ellis and Alexander Ellis expressed concerns about Hospice.  Alexander Ellis best friend died recently.  The [name] believe that Hospice services restricted him from seeing his doctors and even receiving water at end of life.  I explained that often times Hospice services are called in too late - when people are actively dying.  I attempted to explain that the Alexander Ellis family would receive much more benefit if Hospice was called in  early rather than late.    The difference between aggressive medical intervention and comfort care was considered in light of the patient's goals of care.  Alexander Ellis wants to be able to bring Alexander Ellis back to the Ellis if needed.  She wants to be able to take him to see his PCP.  She feels strongly that Hospice may dictate the type of care Alexander Ellis can receive.  She does not want his care to be limited.    I talked with Alexander Ellis about when he would want to "Just be comfortable" rather than pursuing further Ellis treatment.  [name] states that if he is "down in the bed and not getting back up" he would prefer to be comfortable rather than pursue further aggressive treatment.  Hospice and Palliative Care services outpatient were explained and offered.  The family is receptive to Palliative Care services at this time.    Questions and concerns were addressed. The family was encouraged to call with questions or concerns.    Primary Decision Maker:  PATIENT and wife.   Wife is default HCPOA if needed.    SUMMARY OF RECOMMENDATIONS    Discharge to home with medically ready with Palliative Care services to follow Alexander Ellis or Alexander Ellis) Patient and wife need more education about Hospice Services from [employer]. Senna nightly.  Patient has chronic constipation. Low dose morphine SL PRN pain or shortness of breath.  Code Status/Advance Care Planning:  DNR   Prognosis:  Less than 6 months.  Stage 4 metastatic prostate cancer now with mets to lungs causing RML lung collapse and necessitating supplemental oxygen.  No further treatment options.   Patient is at high risk for recurrent pneumonia or other acute event that could end his life.    Discharge Planning: Home with Palliative Services      Primary Diagnoses: Present on Admission: . Respiratory failure (Alexander Ellis)   I have reviewed the medical record, interviewed the patient and family, and examined the patient. The following  aspects are pertinent.  Past Medical History:  Diagnosis Date  . Anemia   . Aneurysm of abdominal aorta (HCC)   . Bone metastasis ([place])   . Cancer [facility]) 2014   prostate  . Chronic hepatitis B virus infection ([place])   . Cirrhosis ([place])   . COPD (chronic obstructive pulmonary disease) ([place])   . Dyspnea   . GERD (gastroesophageal reflux disease)   . GERD (gastroesophageal reflux disease)   . Hand pain   . Hepatitis   . Hypotension   . Lung nodules   .  Osteoarthritis    Social History   Socioeconomic History  . Marital status: Married    Spouse name: Not on file  . Number of children: Not on file  . Years of education: Not on file  . Highest education level: Not on file  Occupational History  . Not on file  Social Needs  . Financial resource strain: Not on file  . Food insecurity:    Worry: Not on file    Inability: Not on file  . Transportation needs:    Medical: Not on file    Non-medical: Not on file  Tobacco Use  . Smoking status: Former Smoker    Packs/day: 0.50    Years: 60.00    Pack years: 30.00    Types: Cigarettes    Last attempt to quit: 12/20/2017    Years since quitting: 0.2  . Smokeless tobacco: Never Used  Substance and Sexual Activity  . Alcohol use: No    Alcohol/week: 0.0 standard drinks  . Drug use: No  . Sexual activity: Not on file  Lifestyle  . Physical activity:    Days per week: Not on file    Minutes per session: Not on file  . Stress: Not on file  Relationships  . Social connections:    Talks on phone: Not on file    Gets together: Not on file    Attends religious service: Not on file    Active member of club or organization: Not on file    Attends meetings of clubs or organizations: Not on file    Relationship status: Not on file  Other Topics Concern  . Not on file  Social History Narrative  . Not on file   Family History  Problem Relation Age of Onset  . Heart disease Mother   . Hypertension Mother   . Emphysema Father         smoked   Scheduled Meds: . famotidine  20 mg Oral BID  . heparin  5,000 Units Subcutaneous Q8H  . levalbuterol  0.63 mg Nebulization Q6H  . methylPREDNISolone (SOLU-MEDROL) injection  40 mg Intravenous Q12H  . prednisoLONE acetate  1 drop Left Eye QID  . sodium chloride flush  3 mL Intravenous Q12H  . sodium chloride flush  3 mL Intravenous Q12H   Continuous Infusions: . sodium chloride 10 mL/hr at 03/16/18 0600   PRN Meds:.sodium chloride, acetaminophen (TYLENOL) oral liquid 160 mg/5 mL, diphenhydrAMINE, morphine injection, ondansetron **OR** ondansetron (ZOFRAN) IV, senna-docusate, sodium chloride flush, sodium chloride flush Allergies  Allergen Reactions  . Oxycodone Other (See Comments)    somnolence Sleep all the time  . Penicillins Anaphylaxis, Hives and Rash    Has patient had a PCN reaction causing immediate rash, facial/tongue/throat swelling, SOB or lightheadedness with hypotension: Yes Has patient had a PCN reaction causing severe rash involving mucus membranes or skin necrosis: No Has patient had a PCN reaction that required hospitalization: No Has patient had a PCN reaction occurring within the last 10 years: Yes If all of the above answers are "NO", then may proceed with Cephalosporin use.   . Codeine Nausea And Vomiting  . Other Nausea Only and Other (See Comments)    Uncoded Allergy. Allergen: IV contrast  . Hydrocodone-Acetaminophen Nausea And Vomiting  . Iohexol Nausea And Vomiting   Review of Systems Constipation, stabbing intermittent right shoulder pain (not currently present), vomiting, weight loss, constipation.  Physical Exam  Very thin, frail man sitting on EOB, appears energetic and  talkative. CV rrr resp short inspirations, CTA Abdomen soft, thin, NT, ND  Vital Signs: BP 115/75 (BP Location: Right Arm)   Pulse 70   Temp 97.9 F (36.6 C) (Oral)   Resp 18   Wt 55.5 kg   SpO2 93%   BMI 18.06 kg/m  Pain Scale: 0-10 POSS *See Group  Information*: 1-Acceptable,Awake and alert Pain Score: 0-No pain   SpO2: SpO2: 93 % O2 Device:SpO2: 93 % O2 Flow Rate: .O2 Flow Rate (L/min): 4 L/min  IO: Intake/output summary:   Intake/Output Summary (Last 24 hours) at 03/16/2018 1202 Last data filed at 03/16/2018 0600 Gross per 24 hour  Intake 594.33 ml  Output 676 ml  Net -81.67 ml    LBM: Last BM Date: 03/13/18 Baseline Weight: Weight: 55.5 kg Most recent weight: Weight: 55.5 kg     Palliative Assessment/Data: 50%     Time In: 11:00 Time Out: 12:10 Time Total: 70 min. Greater than 50%  of this time was spent counseling and coordinating care related to the above assessment and plan.  Signed by: Florentina Jenny, PA-C Palliative Medicine Pager: [phone]  Please contact Palliative Medicine Team phone at [phone] for questions and concerns.  For individual provider: See Shea Evans

## 2018-03-16 NOTE — Progress Notes (Addendum)
NAME:  Alexander Ellis, MRN:  [id number], DOB:  February 05, 1946, LOS: 2 ADMISSION DATE:  03/14/2018 CHIEF COMPLAINT:  Shortness of breath    Brief History:  72 y/o M, former smoker, admitted to [facility] on 10/22 from the pulmonary office with worsening shortness of breath for 4 weeks and desaturation to 50's on the am of presentation.  Admitted for acute hypoxemic respiratory failure.    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 Marin Olp, on Apalutamide). Chest CT in May 2019 showed evidence of ILD. Patient of Dr. Melvyn Novas, last seen 01/12/18 for dyspnea. Trial high dose prednisone and off Apalutamide in July 2019 and re-challenged in Aug x 1 week.  Saw Dr. Marin Olp and was put back on Apalutamide. Changed oncology providers recently to Dr. Hinton Rao with [facility] in [place], stopped chemotherpay on Oct 3rd.  On 87m prednisone daily on admit. He can not tell a difference with prednisone. Tried albuterol, stopped because it gave him chest pain.    He was placed on 3L O2 with improvement of saturations on admit.    Past Medical History  COPD, ILD, pulm nodules, metastatic prostate cancer, GERD,  hypotension, dyspnea, hepatitis, osteoarthritis, cirrhosis, anemia, aneurysm of abdominal aorta       Significant Hospital Events   10/22  Admit   Consults: date of consult/date signed off & final recs:  Palliative care to see 10/24  Procedures (surgical and bedside):    Significant Diagnostic Tests:  CTA Chest 09/22/2017 >> no evidence for acute pulmonary embolus. Prominence of the main pulmonary arteries suggests pulmonary arterial hypertension. Underlying subpleural reticulation in the lungs with a lower lung predominance. Imaging features suggest component of fibrotic lung disease. Stable appearance of mediastinal lymphadenopathy, bilateral pulmonary nodules, and left adrenal nodule. Widespread sclerotic bony metastatic  disease. PFT's 01/12/2018 >> FEV1 2.37 (79 % ) ratio 78 p 1 % improvement from saba p nothing prior to study with DLCO 14/16c % corrects to 27 % for alv volume  01/12/2018 Walked RA x 3 laps normal pace, no sob or desat CTA Chest 03/14/18 >> negative for PE, progression of metastatic disease in the thorax, progression of right hilar adenopathy with occlusion of the RML bronchus with RML collapse, increase in mediastinal nodes, diffuse osseus metastatic disease including extraossous soft tissue involvement of the spinal canal at T7, T9, RLL perihilar consolidation, advanced emphysema with basilar fibrosis  Micro Data:  RVP 10/22 >> negative  BCx2 10/22 >>  Sputum 10/23 >>  Rare gpc, no wbc Urine clean catch 10/22 >  Insignificant growth  Antimicrobials:  Levaquin 10/22 >>  D/c 10/24   Subjective:  Feels the best he's felt "in a while" on 02 but had episode of vomiting yesterday, tol fbast ok today  Objective    Wt Readings from Last 3 Encounters:  03/16/18 55.5 kg  03/14/18 57.6 kg  01/12/18 60.8 kg     Vital signs reviewed - Note at rest supine  02 sats  93% on 4lpm      Pt alert, approp talkative/  nad @ 30 degrees hob No jvd Orophartnx clear/ dentition poor  Neck supple Lungs with a few  exp > insp rhonchi bilaterally R > L  RRR no s3 or or sign murmur Abd obese with nl excursion   Extr wam with no edema - Moderate clubbing noted Neuro  Nl sensorium/ motor deficits    Resolved Hospital Problem list  Assessment & Plan:   Acute Respiratory Failure with Hypoxia - new O2 requirement, suspect secondary to RML volume loss in the setting of metastatic prostate adenocarcinoma superimposed on emphysema & scarring / ILD P: Improved on 4lpm / will need 02 at d/c     ILD  s evidence of significant copd    ? Chemo induced lung injury/ met ca - no evidence of na   P: Reduce  Solumedrol to  66m Q12  D/c levaquin PRN xopenex   Metastatic Prostate Cancer  - [facility] positive for [facility] adenocarcinoma - [facility] [place] Not currently on chemotherapy, stopped Apalutamide on October 3rd and was told there were no other options for therapy - CTA Chest 09/22/2017 showed mediastinal lymphadenopathy and pulmonary nodules. Widespread sclerotic bony metastatic disease. P: Discussed at length with pt at bedside with wife present  1) no options for effective treatment of the underlying dz 2) focus should be on comfort going forward, also hospice care on d/c to prevent unnecessary readmit but first need to be sure po route adequate for out pt rx    Disposition / Summary of Today's Plan 03/16/18   Palliative care to see today ? Home on Hospice 10/25 ???     Diet: Regular Pain/Anxiety/Delirium protocol (if indicated): Tramadol q6hrs prn pain DVT prophylaxis: Heparin [place] and SCD's GI prophylaxis: Famotidine BID Mobility: Up with assistance  Code Status: DNR/DNI Family Communication:  Wife and patient updated on plan of care    Labs   CBC: Recent Labs  Lab 03/14/18 1415 03/15/18 0532 03/16/18 0412  WBC 10.6* 8.8 12.7*  NEUTROABS 9.0*  --   --   HGB 9.7* 8.7* 9.0*  HCT 30.9* 28.3* 29.3*  MCV 106.6* 107.2* 106.2*  PLT 264 234 2440   Basic Metabolic Panel: Recent Labs  Lab 03/14/18 1415 03/15/18 0532 03/16/18 0412  NA 139 138 136  K 4.6 5.0 4.8  CL 107 106 103  CO2 22 21* 24  GLUCOSE 125* 134* 106*  BUN 20 22 29*  CREATININE 1.02 1.08 1.22  CALCIUM 8.5* 8.6* 8.5*   GFR: Estimated Creatinine Clearance: 41.7 mL/min (by C-G formula based on SCr of 1.22 mg/dL). Recent Labs  Lab 03/14/18 1415 03/15/18 0532 03/16/18 0412  PROCALCITON <0.10 <0.10 <0.10  WBC 10.6* 8.8 12.7*    Liver Function Tests: Recent Labs  Lab 03/14/18 1415  AST 18  ALT 11  ALKPHOS 40  BILITOT 0.5  PROT 6.5  ALBUMIN 2.6*   No results for input(s): LIPASE, AMYLASE in  the last 168 hours. No results for input(s): AMMONIA in the last 168 hours.  ABG    Component Value Date/Time   PHART 7.495 (H) 03/14/2018 1300   PCO2ART 28.7 (L) 03/14/2018 1300   PO2ART 54.6 (L) 03/14/2018 1300   HCO3 21.9 03/14/2018 1300   TCO2 22 07/19/2014 1317   ACIDBASEDEF 0.2 03/14/2018 1300   O2SAT 87.9 03/14/2018 1300     Coagulation Profile: No results for input(s): INR, PROTIME in the last 168 hours.  Cardiac Enzymes: Recent Labs  Lab 03/14/18 1415 03/14/18 1830 03/15/18 0028  TROPONINI <0.03 <0.03 <0.03    HbA1C: No results found for: HGBA1C  CBG: No results for input(s): GLUCAP in the last 168 hours.  > 50% of 30 min hosp visit spent in counseling    MChristinia Gully MD Pulmonary and Critical Care  [place] [phone] After 5:30 PM or weekends, use [facility] [phone]

## 2018-03-16 NOTE — Plan of Care (Addendum)
Patient continues to require 3 liters of oxygen to main oxygen saturation above 90% at rest.  Patient verbally denies dyspnea but is noticeably dyspneic with exertion.  Tolerating regular diet.  Wife at bedside most of shift.  Refused prune juice as ordered by palliative.

## 2018-03-16 NOTE — Progress Notes (Signed)
Patient took oxygen off to get up to bathroom, checked oxygen saturation when returned to bed, read as 62-65% on room air.  Patient placed back on oxygen at 4 liters, oxygen saturation gradually up to 92% at rest.  Educated patient on the importance of not removing his oxygen, extensions in place to assist patient to  move around in room while keeping his oxygen on.

## 2018-03-16 NOTE — Progress Notes (Signed)
PAtient refused prune juice.

## 2018-03-17 DIAGNOSIS — C799 Secondary malignant neoplasm of unspecified site: Secondary | ICD-10-CM

## 2018-03-17 LAB — CULTURE, RESPIRATORY: CULTURE: NORMAL

## 2018-03-17 LAB — CULTURE, RESPIRATORY W GRAM STAIN: Gram Stain: NONE SEEN

## 2018-03-17 LAB — GLUCOSE, RANDOM: Glucose, Bld: 111 mg/dL — ABNORMAL HIGH (ref 70–99)

## 2018-03-17 MED ORDER — PREDNISONE 20 MG PO TABS: ORAL_TABLET | ORAL | 3 refills | Status: AC

## 2018-03-17 MED ORDER — PREDNISONE 20 MG PO TABS
40.0000 mg | ORAL_TABLET | Freq: Every day | ORAL | Status: DC
  Administered 2018-03-17: 40 mg via ORAL
  Filled 2018-03-17: qty 2

## 2018-03-17 MED ORDER — MORPHINE SULFATE (CONCENTRATE) 10 MG/0.5ML PO SOLN: 5.0000 mg | ORAL | 0 refills | Status: AC | PRN

## 2018-03-17 MED ORDER — HEPARIN SOD (PORK) LOCK FLUSH 100 UNIT/ML IV SOLN
500.0000 [IU] | INTRAVENOUS | Status: AC | PRN
  Administered 2018-03-17: 500 [IU]

## 2018-03-17 NOTE — Progress Notes (Addendum)
NAME:  DIERRE CREVIER, MRN:  [id number], DOB:  [birth year], LOS: 3 ADMISSION DATE:  03/14/2018 CHIEF COMPLAINT:  Shortness of breath    Brief History:  72 y/o M, former smoker, admitted to [facility] on 10/22 from the pulmonary office with worsening shortness of breath for 4 weeks and desaturation to 50's on the am of presentation.  Admitted for acute hypoxemic respiratory failure.    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 Marin Olp, on Apalutamide). Chest CT in May 2019 showed evidence of ILD. Patient of Dr. Melvyn Novas, last seen 01/12/18 for dyspnea. Trial high dose prednisone and off Apalutamide in July 2019 and re-challenged in Aug x 1 week.  Saw Dr. Marin Olp and was put back on Apalutamide. Changed oncology providers recently to Dr. Hinton Rao with [facility] in [place], stopped chemotherpay on Oct 3rd.  On 47m prednisone daily on admit. He can not tell a difference with prednisone. Tried albuterol, stopped because it gave him chest pain.    He was placed on 3L O2 with improvement of saturations on admit.    Past Medical History  COPD, ILD, pulm nodules, metastatic prostate cancer, GERD,  hypotension, dyspnea, hepatitis, osteoarthritis, cirrhosis, anemia, aneurysm of abdominal aorta   Significant Hospital Events   10/22  Admit  10/24  Palliative care consulted   Consults: date of consult/date signed off & final recs:  Palliative Care 10/24  Procedures (surgical and bedside):    Significant Diagnostic Tests:  CTA Chest 09/22/2017 >> no evidence for acute pulmonary embolus. Prominence of the main pulmonary arteries suggests pulmonary arterial hypertension. Underlying subpleural reticulation in the lungs with a lower lung predominance. Imaging features suggest component of fibrotic lung disease. Stable appearance of mediastinal lymphadenopathy, bilateral pulmonary nodules, and left adrenal nodule. Widespread  sclerotic bony metastatic disease. PFT's 01/12/2018 >> FEV1 2.37 (79 % ) ratio 78 p 1 % improvement from saba p nothing prior to study with DLCO 14/16c % corrects to 27 % for alv volume  01/12/2018 Walked RA x 3 laps normal pace, no sob or desat CTA Chest 03/14/18 >> negative for PE, progression of metastatic disease in the thorax, progression of right hilar adenopathy with occlusion of the RML bronchus with RML collapse, increase in mediastinal nodes, diffuse osseus metastatic disease including extraossous soft tissue involvement of the spinal canal at T7, T9, RLL perihilar consolidation, advanced emphysema with basilar fibrosis  Micro Data:  RVP 10/22 >> negative  BCx2 10/22 >>  Sputum 10/23 >>  Rare gpc, no wbc Urine clean catch 10/22 >  Insignificant growth  Antimicrobials:  Levaquin 10/22 >> 10/24   Subjective:  Pt reports he feels like "a new man with the oxygen".  No acute complaints.   Objective    Wt Readings from Last 3 Encounters:  03/17/18 54.1 kg  03/14/18 57.6 kg  01/12/18 60.8 kg   Blood pressure 98/65, pulse 75, temperature 97.7 F (36.5 C), temperature source Oral, resp. rate 18, weight 54.1 kg, SpO2 92 %.  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 Chemo induced lung injury/ met ca  P: Transitioned to prednisone 40 mg QD> taper to  20 mg daily and hold there  Monitor off antibiotics  PRN xopenex    Metastatic Prostate Cancer  - 07/2017 he had an FOB at [facility] with FNA > positive for metastatic prostate adenocarcinoma - Followed by Dr. Anabel Bene with [facility] in [place] - Not currently on chemotherapy, stopped Apalutamide on October 3rd and was told there were no other options for therapy - CTA Chest 09/22/2017 showed mediastinal lymphadenopathy and pulmonary nodules. Widespread sclerotic bony metastatic disease. P: Discussed at length with pt at bedside with wife present  1) no options for effective treatment of the underlying dz 2) focus should be on comfort going forward >  Pt declined hospice for now   Severe Protein Calorie Malnutrition P: Ensure TID  Diet as tolerated   Disposition / Summary of Today's Plan 03/17/18   Ambulate.  May be able to go home once O2 arranged. Will follow up.     Diet: Regular Pain/Anxiety/Delirium protocol (if indicated): Tramadol q6hrs prn pain DVT prophylaxis: Heparin [place] and SCD's GI prophylaxis: Famotidine BID Mobility: Up with assistance  Code Status: DNR/DNI Family Communication:  Wife & patient updated on plan of care.  Will follow up on O2 to assess for discharge 10/25.     Labs   CBC: Recent Labs  Lab 03/14/18 1415 03/15/18 0532 03/16/18 0412  WBC 10.6* 8.8 12.7*  NEUTROABS 9.0*  --   --   HGB 9.7* 8.7* 9.0*  HCT 30.9* 28.3* 29.3*  MCV 106.6* 107.2* 106.2*  PLT 264 234 427    Basic Metabolic Panel: Recent Labs  Lab 03/14/18 1415 03/15/18 0532 03/16/18 0412 03/17/18 0334  NA 139 138 136  --   K 4.6 5.0 4.8  --   CL 107 106 103  --   CO2 22 21* 24  --   GLUCOSE 125* 134* 106* 111*  BUN 20 22 29*  --   CREATININE 1.02 1.08 1.22  --   CALCIUM 8.5* 8.6* 8.5*  --    GFR: Estimated Creatinine Clearance: 40.6 mL/min (by C-G formula based on SCr of 1.22 mg/dL). Recent Labs  Lab 03/14/18 1415 03/15/18 0532 03/16/18 0412  PROCALCITON  <0.10 <0.10 <0.10  WBC 10.6* 8.8 12.7*    Liver Function Tests: Recent Labs  Lab 03/14/18 1415  AST 18  ALT 11  ALKPHOS 40  BILITOT 0.5  PROT 6.5  ALBUMIN 2.6*   No results for input(s): LIPASE, AMYLASE in the last 168 hours. No results for input(s): AMMONIA in the last 168 hours.  ABG    Component Value Date/Time   PHART 7.495 (H) 03/14/2018 1300   PCO2ART 28.7 (L) 03/14/2018 1300   PO2ART 54.6 (L) 03/14/2018 1300   HCO3 21.9 03/14/2018 1300   TCO2 22 07/19/2014 1317   ACIDBASEDEF 0.2 03/14/2018 1300   O2SAT 87.9 03/14/2018 1300     Coagulation Profile: No results for input(s): INR, PROTIME in the last 168 hours.  Cardiac Enzymes: Recent Labs  Lab 03/14/18 1415 03/14/18 1830 03/15/18 0028  TROPONINI <0.03 <0.03 <0.03    HbA1C: No results found for: HGBA1C  CBG: No results for input(s): GLUCAP in the last  168 hours.   Noe Gens, NP-C [facility] Pgr: [phone] or if no answer [phone] 03/17/2018, 10:23 AM    Attending: Pt seen and examined and discussed limited options with pt and wife. He has has misconception on purpose of hospice and since we haven't yet tried a palliative approach (to which some pts with met ca seem to actually respond to better and live longer s continued exp to toxic chemo) I will address this again after we give him a few weeks of just focusing on comfort issues.    Ok for d/c from my perspective and f/u in 2 weeks.    Christinia Gully, MD Pulmonary and [place] [phone] After 5:30 PM or weekends, use [facility] [phone]

## 2018-03-17 NOTE — Progress Notes (Signed)
Patient notified writer he urinated in commode and bright red blood seen.  Writer did assess and notified Noe Gens, NP.  Velna Hatchet stated she would let Dr. Melvyn Novas know to see what he wanted to do.  Will continue to monitor patient.

## 2018-03-17 NOTE — Progress Notes (Signed)
SATURATION QUALIFICATIONS: (This note is used to comply with regulatory documentation for home oxygen)  Patient Saturations on Room Air at Rest = 76%  Patient Saturations on Room Air while Ambulating = 96%  Patient Saturations on 3 Liters of oxygen while Ambulating = 96%  Please briefly explain why patient needs home oxygen: Patient lives in [place], [place]

## 2018-03-17 NOTE — Discharge Summary (Signed)
Physician Discharge Summary  Patient ID: Alexander Ellis MRN: [id number] DOB/AGE: [birth year] 72 y.o.  Admit date: 03/14/2018 Discharge date: 03/17/2018    Discharge Diagnoses:  Acute Hypoxic Respiratory Failure  ILD Metastatic Prostate Cancer  Severe Protein Calorie Malnutrition                                                                       DISCHARGE PLAN BY DIAGNOSIS     Acute Hypoxic Respiratory Failure  ILD  Discharge Plan: O2 arranged for discharge, 3L continuously  PRN albuterol for home (though doubt he will use) PRN morphine as needed for dyspnea or pain  Follow up with Pulmonary as arranged Taper prednisone by 85m per week to 263mand then stay (baseline dosing)  Metastatic Prostate Cancer   Discharge Plan: No further acute follow up  Pt is followed by Dr. McAnabel Benen Ellis has been told there are no further therapies for his malignancy.  Pt met with palliative care and declined home services at this time.   Severe Protein Calorie Malnutrition   Discharge Plan: Ensure TID  Diet as tolerated                      DISCHARGE SUMMARY   [name], former smoker, who [facility] Pulmonary Office with a 4 week history of shortness of breath.  He was found to have saturations in the 50's on presentation to the office.  The patient was directly admitted for acute hypoxemic respiratory failure for further evaluation.    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 Chest CT in May 2019 showed evidence of ILD. Patient of Alexander Ellis for dyspnea. Trial high dose prednisone and off Ellis in July 2019 and re-challenged in Aug x 1 week. Saw Dr. EnMarin Ellis was put back on Ellis. Changed oncology providers recently to Alexander Ellis chemotherpay on Oct  3rd.  On 2074mrednisone daily on admit. He can not tell a difference with prednisone. Tried albuterol, stopped because it gave him chest pain.    He was placed on 3L O2 with improvement of saturations on admit. The patient reported he felt much improved with the use of oxygen.  He underwent CTA of the chest which was negative for PE but demonstrated progression of metastatic disease in the thorax, fibrosis, increased mediastinal lymphadenopathy and widespread bony metastatic disease.  The patient met with Palliative Care and agreed that he did not want CPR or intubation.  He declined palliative care services at home.  The patient was medically cleared for discharge 10/25 with plans as above.    SIGNIFICANT DIAGNOSTIC TESTS CTA Chest 09/22/2017 >> no evidence for acute pulmonary embolus. Prominence of the main pulmonary arteries suggests pulmonary arterial hypertension. Underlying subpleural reticulation in the lungs with a lower lung predominance. Imaging features suggest component of fibrotic lung disease. Stable appearance of mediastinal lymphadenopathy, bilateral pulmonary nodules, and left adrenal nodule. Widespread sclerotic bony metastatic disease. PFT's 01/12/2018 >> FEV1 2.37 (79 % ) ratio 78 p 1 % improvement from saba p  nothing prior to study with DLCO 14/16c % corrects to 27 % for alv volume  01/12/2018 Walked RA x 3 laps normal pace, no sob or desat CTA Chest 03/14/18 >> negative for PE, progression of metastatic disease in the thorax, progression of right hilar adenopathy with occlusion of the RML bronchus with RML collapse, increase in mediastinal nodes, diffuse osseus metastatic disease including extraossous soft tissue involvement of the spinal canal at T7, T9, RLL perihilar consolidation, advanced emphysema with basilar fibrosis           ANTIBIOTICS Levaquin 10/22 >> 10/24   MICRO DATA RVP 10/22 >> negative  BCx2 10/22 >>  Sputum 10/23 >>  Rare gpc, no wbc Urine clean catch  10/22 >  Insignificant growth  CONSULTS Palliative Care   Discharge Exam: General: elderly male lying in bed in NAD HEENT: edentulous, ulceration to left upper gum / chronic non-healing wound Neuro:AAOx4, speech clear, MAE  CV: s1s2 rrr, no m/r/g PULM: even/non-labored on [place] O2, lungs clear GI: soft, non-tender, bsx4 active  Extremities: warm/dry, no rashes or lesions  Vitals:   03/17/18 0515 03/17/18 0817 03/17/18 0821 03/17/18 1334  BP: 98/65     Pulse: 75     Resp: 18     Temp: 97.7 F (36.5 C)     TempSrc: Oral     SpO2: 93% 92% 92% 92%  Weight: 54.1 kg        Discharge Labs  BMET Recent Labs  Lab 03/14/18 1415 03/15/18 0532 03/16/18 0412 03/17/18 0334  NA 139 138 136  --   K 4.6 5.0 4.8  --   CL 107 106 103  --   CO2 22 21* 24  --   GLUCOSE 125* 134* 106* 111*  BUN 20 22 29*  --   CREATININE 1.02 1.08 1.22  --   CALCIUM 8.5* 8.6* 8.5*  --     CBC Recent Labs  Lab 03/14/18 1415 03/15/18 0532 03/16/18 0412  HGB 9.7* 8.7* 9.0*  HCT 30.9* 28.3* 29.3*  WBC 10.6* 8.8 12.7*  PLT 264 234 282    Anti-Coagulation No results for input(s): INR in the last 168 hours.  Discharge Instructions    Call MD for:  difficulty breathing, headache or visual disturbances   Complete by:  As directed    Call MD for:  extreme fatigue   Complete by:  As directed    Call MD for:  hives   Complete by:  As directed    Call MD for:  persistant dizziness or light-headedness   Complete by:  As directed    Call MD for:  persistant nausea and vomiting   Complete by:  As directed    Call MD for:  redness, tenderness, or signs of infection (pain, swelling, redness, odor or green/yellow discharge around incision site)   Complete by:  As directed    Call MD for:  severe uncontrolled pain   Complete by:  As directed    Call MD for:  temperature >100.4   Complete by:  As directed    Diet general   Complete by:  As directed    Discharge instructions   Complete by:  As  directed    1.  Please review your medications carefully as the may have changed. 2.  Taper your prednisone as prescribed 3.  Use the morphine as needed for shortness of breath or pain 4.  Follow up in the office as scheduled.  Please re-schedule if you  feel you can not make it.  5.  Call the Pulmonary office or return to the ER immediately if new or worsening symptoms.   Increase activity slowly   Complete by:  As directed        Follow-up Information    Martyn Ehrich, NP Follow up on 04/13/2018.   Specialty:  Pulmonary Disease Why:  Appt at 9:30 AM.  Please arrive at 9:15 for check in.  Contact information: [place] [address] [phone]            Allergies as of 03/17/2018      Reactions   Oxycodone Other (See Comments)   somnolence Sleep all the time   Penicillins Anaphylaxis, Hives, Rash   Has patient had a PCN reaction causing immediate rash, facial/tongue/throat swelling, SOB or lightheadedness with hypotension: Yes Has patient had a PCN reaction causing severe rash involving mucus membranes or skin necrosis: No Has patient had a PCN reaction that required hospitalization: No Has patient had a PCN reaction occurring within the last 10 years: Yes If all of the above answers are "NO", then may proceed with Cephalosporin use.   Codeine Nausea And Vomiting   Other Nausea Only, Other (See Comments)   Uncoded Allergy. Allergen: IV contrast   Hydrocodone-acetaminophen Nausea And Vomiting   Iohexol Nausea And Vomiting      Medication List    STOP taking these medications   Ellis 60 MG tablet Commonly known as:  ERLEADA   AZOPT 1 % ophthalmic suspension Generic drug:  brinzolamide   clindamycin 300 MG capsule Commonly known as:  CLEOCIN   COMBIGAN 0.2-0.5 % ophthalmic solution Generic drug:  brimonidine-timolol   erythromycin 250 MG EC tablet Commonly known as:  ERY-TAB   fludrocortisone 0.1 MG tablet Commonly  known as:  FLORINEF   levofloxacin 750 MG tablet Commonly known as:  LEVAQUIN   lidocaine-prilocaine cream Commonly known as:  EMLA   traMADol 50 MG tablet Commonly known as:  ULTRAM     TAKE these medications   acetaminophen 500 MG tablet Commonly known as:  TYLENOL Take 500 mg by mouth at bedtime as needed (pain).   albuterol (2.5 MG/3ML) 0.083% nebulizer solution Commonly known as:  PROVENTIL USE 1 VIAL IN NEBULIZER EVERY 4 HOURS   albuterol 108 (90 Base) MCG/ACT inhaler Commonly known as:  PROVENTIL HFA;VENTOLIN HFA INAHLE 1 PUFF BY MOUTH Q 4 H PRN   chlorhexidine 0.12 % solution Commonly known as:  PERIDEX Use as directed 15 mLs in the mouth or throat 2 (two) times daily.   dorzolamide-timolol 22.3-6.8 MG/ML ophthalmic solution Commonly known as:  COSOPT Place 1 drop into the left eye 2 times daily.   HYDROcodone-acetaminophen 7.5-325 MG tablet Commonly known as:  NORCO Take 1-2 tablets by mouth every 4 (four) hours as needed. for pain   ICY HOT BALM EXTRA STRENGTH EX Apply 1 patch topically daily as needed (back pain).   Lidocaine 2 % Gel Apply topically. Apply to gums 4 x days with a qtip   morphine CONCENTRATE 10 MG/0.5ML Soln concentrated solution Place 0.25 mLs (5 mg total) under the tongue every 2 (two) hours as needed for severe pain or shortness of breath.   ondansetron 8 MG tablet Commonly known as:  ZOFRAN Take 1 tablet (8 mg total) by mouth 2 (two) times daily as needed for refractory nausea / vomiting.   prednisoLONE acetate 1 % ophthalmic suspension Commonly known as:  PRED FORTE  Place 1 drop into the left eye 4 times daily.   predniSONE 20 MG tablet Commonly known as:  DELTASONE 2 tabs PO for one week, then 1.5 tabs for one week then 1 tab daily What changed:    medication strength  how much to take  how to take this  when to take this  additional instructions   PROBIOTIC PO Take 1 capsule by mouth daily.   prochlorperazine 10  MG tablet Commonly known as:  COMPAZINE Take 10 mg by mouth every 6 (six) hours as needed for nausea or vomiting.   ranitidine 300 MG tablet Commonly known as:  ZANTAC Take 300 mg by mouth daily as needed for heartburn.            Durable Medical Equipment  (From admission, onward)         Start     Ordered   03/17/18 1414  For home use only DME oxygen  Once    Question Answer Comment  Mode or (Route) Nasal cannula   Liters per Minute 3   Frequency Continuous (stationary and portable oxygen unit needed)   Oxygen delivery system Gas      03/17/18 1414          Disposition: Home.  No new home health needs identified.   Discharged Condition: Alexander Ellis has met maximum benefit of inpatient care and is medically stable and cleared for discharge.  Patient is pending follow up as above.      Time spent on disposition:  35  Minutes.   Signed: Noe Gens, NP-C [facility] Pgr: [phone] Office: [phone]

## 2018-03-17 NOTE — Care Management Note (Addendum)
Case Management Note  Patient Details  Name: Alexander Ellis MRN: [id number] Date of Birth: Jul 28, 1945  Subjective/Objective:                  Discharge planning: Palliative care for home patient and wife reufsed at this time, list and phone numbers given to wife for future use.  Difference between hospice and palliative care explained to the family understands.  Does want home o2  Action/Plan: Home o2 through [facility]. o2 results in the chart.Representative notified at 1431. Signed         SATURATION QUALIFICATIONS: (This note is used to comply with regulatory documentation for home oxygen)  Patient Saturations on Room Air at Rest = 76%  Patient Saturations on Room Air while Ambulating = 96%  Patient Saturations on 3 Liters of oxygen while Ambulating = 96%  Please briefly explain why patient needs home oxygen: Patient lives in [place], [place]           Expected Discharge Date:  (unknown)               Expected Discharge Plan:     In-House Referral:     Discharge planning Services     Post Acute Care Choice:    Choice offered to:     DME Arranged:    DME Agency:     HH Arranged:    [place] Agency:     Status of Service:     If discussed at [employer], dates discussed:    Additional Comments:  Leeroy Cha, RN 03/17/2018, 11:13 AM

## 2018-03-17 NOTE — Progress Notes (Signed)
    Durable Medical Equipment  (From admission, onward)         Start     Ordered   03/17/18 1414  For home use only DME oxygen  Once    Question Answer Comment  Mode or (Route) Nasal cannula   Liters per Minute 3   Frequency Continuous (stationary and portable oxygen unit needed)   Oxygen delivery system Gas      03/17/18 1414

## 2018-03-17 NOTE — Progress Notes (Signed)
Dr. Melvyn Novas came and assessed patient.  Dr. Melvyn Novas informed writer that patient cleared to discharge.

## 2018-03-19 LAB — CULTURE, BLOOD (ROUTINE X 2)
Culture: NO GROWTH
Culture: NO GROWTH
SPECIAL REQUESTS: ADEQUATE
Special Requests: ADEQUATE

## 2018-03-22 ENCOUNTER — Telehealth: Payer: Self-pay | Admitting: Primary Care

## 2018-03-22 NOTE — Telephone Encounter (Signed)
Called spoke with Loma Sousa at [facility] who is requesting dx code for patient's Morphine Rx done by Noe Gens at the 10.25.19 discharge  Dx code for dyspnea and prostate cancer metastatic to bone  Nothing further needed; will sign off

## 2018-03-24 DIAGNOSIS — J8410 Pulmonary fibrosis, unspecified: Secondary | ICD-10-CM

## 2018-03-24 DIAGNOSIS — D649 Anemia, unspecified: Secondary | ICD-10-CM

## 2018-03-24 DIAGNOSIS — R9721 Rising PSA following treatment for malignant neoplasm of prostate: Secondary | ICD-10-CM

## 2018-03-24 DIAGNOSIS — C7802 Secondary malignant neoplasm of left lung: Secondary | ICD-10-CM | POA: Diagnosis not present

## 2018-03-24 DIAGNOSIS — C61 Malignant neoplasm of prostate: Secondary | ICD-10-CM | POA: Diagnosis not present

## 2018-03-24 DIAGNOSIS — E871 Hypo-osmolality and hyponatremia: Secondary | ICD-10-CM

## 2018-03-24 DIAGNOSIS — C7801 Secondary malignant neoplasm of right lung: Secondary | ICD-10-CM

## 2018-03-24 DIAGNOSIS — C7951 Secondary malignant neoplasm of bone: Secondary | ICD-10-CM | POA: Diagnosis not present

## 2018-03-24 DIAGNOSIS — Z9221 Personal history of antineoplastic chemotherapy: Secondary | ICD-10-CM

## 2018-03-28 ENCOUNTER — Ambulatory Visit (INDEPENDENT_AMBULATORY_CARE_PROVIDER_SITE_OTHER): Payer: Medicare Other | Admitting: Pulmonary Disease

## 2018-03-28 ENCOUNTER — Encounter: Payer: Self-pay | Admitting: Pulmonary Disease

## 2018-03-28 VITALS — BP 98/60 | HR 83 | Ht 69.0 in | Wt 120.0 lb

## 2018-03-28 DIAGNOSIS — Z7189 Other specified counseling: Secondary | ICD-10-CM

## 2018-03-28 DIAGNOSIS — J439 Emphysema, unspecified: Secondary | ICD-10-CM | POA: Diagnosis not present

## 2018-03-28 DIAGNOSIS — J9611 Chronic respiratory failure with hypoxia: Secondary | ICD-10-CM

## 2018-03-28 MED ORDER — IPRATROPIUM BROMIDE 0.02 % IN SOLN: 0.5000 mg | Freq: Four times a day (QID) | RESPIRATORY_TRACT | 3 refills | Status: DC | PRN

## 2018-03-28 MED ORDER — IPRATROPIUM BROMIDE 0.02 % IN SOLN: 0.5000 mg | Freq: Four times a day (QID) | RESPIRATORY_TRACT | 12 refills | Status: DC

## 2018-03-28 NOTE — Assessment & Plan Note (Signed)
Continue follow-up with palliative care >>> Further address CPR status and discussed mechanical ventilation  Will discuss with [facility] increasing her oxygen need to make sure that your concentrator at home >>> Will order 10 L concentrator  Suspect patient will need 6 L at rest and 8 L with exertion  Bring SPO2 to next office visit  >>>make sure hands are warm as possible   Walk/ wheelchair assist in office today to adjust oxygen settings   Follow up with [place] next week   If oxygen saturations are persistently dropping unable to be maintained with oxygen as prescribed, patient will need to present to emergency room or contact 911

## 2018-03-28 NOTE — Assessment & Plan Note (Addendum)
We will add ipratropium nebulized medications to help with breathing Increased oxygen needs to 6 L at rest and 8 L to 10 L with exertion New oxygen concentrator ordered from [facility] Continue with 20mg  prednisone  Follow-up in 1 week with Dr. Melvyn Novas

## 2018-03-28 NOTE — Assessment & Plan Note (Signed)
I discussed with the patient today with his needs and request would be.  Patient would like to have CPR performed him if his heart would stop.  Patient would not like mechanical ventilation if he has shortness of breath and would require so.  Patient would like noninvasive ventilation such as BiPAP.  I have discussed with the patient that this will be very difficult for [place] to maintain as patient is at end-stage of emphysema and respiratory needs.  We are very limited with other agents that we could add to help support him with his breathing.  Continue follow-up with palliative care

## 2018-03-28 NOTE — Progress Notes (Signed)
@Patient  ID: Alexander Ellis, male    DOB: 02-27-1946, 72 y.o.   MRN: [id number]  Chief Complaint  Patient presents with  . Hospitalization Follow-up    SOB    Referring provider: [name], NP  HPI:  72 year old male patient followed in our office for  PMH: Metastatic prostate cancer, severe protein calorie malnutrition, Smoker/ Smoking History: Former smoker.  30-pack-year smoking history. Maintenance:   Pt of: Dr. Melvyn Novas  03/28/2018  - Visit   72 year old male patient presenting today for hospital follow-up.  Unfortunately patient's oxygen saturations have not been well controlled.  Patient presents today with spouse as well as son.  Family reporting that oxygen saturations have been sometimes in the 40s to 60s.  Patient checking with SPO2 monitor peripherally on fingers.  Patient is still on 3 L nasal cannula.  Patient reports they have not increased her oxygen as they were told by previous providers not to go up on oxygen levels as it could cause him to have toxicity.  Patient also reports increased weakness, at risk for falls, occasional chest pain with exertion which is relieved at rest.  Patient completed follow-up with oncologist in [place] yesterday.  Oxygen saturations were 60%.  Oncologist encourage patient to follow-up with pulmonary.  Patient as well as family reports that they have started working with palliative care.  Through [facility], in the [facility] area.   Tests:  CTA Chest 09/22/2017>>no evidence for acute pulmonary embolus. Prominence of the main pulmonary arteries suggests pulmonary arterial hypertension. Underlying subpleural reticulation in the lungs with a lower lung predominance. Imaging features suggest component of fibrotic lung disease. Stable appearance of mediastinal lymphadenopathy, bilateral pulmonary nodules, and left adrenal nodule. Widespread sclerotic bony metastatic disease. PFT's 01/12/2018>>FEV1 2.37 (79 % ) ratio 78 p 1 % improvement  from saba p nothing prior to study with DLCO 14/16c % corrects to 27 % for alv volume  8/22/2019Walked RA x 3 laps normal pace, no sob or desat CTA Chest 03/14/18 >>negative for PE, progression of metastatic disease in the thorax, progression of right hilar adenopathy with occlusion of the RML bronchus with RML collapse, increase in mediastinal nodes, diffuse osseus metastatic disease including extraossous soft tissue involvement of the spinal canal at T7, T9, RLL perihilar consolidation, advanced emphysema with basilar fibrosis  FENO:  No results found for: NITRICOXIDE  PFT: PFT Results Latest Ref Rng & Units 01/12/2018  FVC-Pre L 3.17  FVC-Predicted Pre % 77  FVC-Post L 3.03  FVC-Predicted Post % 74  Pre FEV1/FVC % % 74  Post FEV1/FCV % % 78  FEV1-Pre L 2.34  FEV1-Predicted Pre % 79  DLCO UNC% % 14  DLCO COR %Predicted % 27  TLC L 5.65  TLC % Predicted % 82  RV % Predicted % 110    Imaging: X-ray Chest Pa And Lateral  Result Date: 03/14/2018 CLINICAL DATA:  Respiratory failure EXAM: CHEST - 2 VIEW COMPARISON:  CT 02/21/2018, radiograph 02/21/2018, 01/27/2018 FINDINGS: Right-sided central venous port tip over the SVC. Emphysematous disease and bilateral fibrosis. Progressed consolidation at the right middle lobe and right base. No pleural effusion. Bilateral lung nodules corresponding to pulmonary metastatic disease. Old left-sided rib fractures. Stable cardiomediastinal silhouette. Multifocal sclerosis at the ribs and spine. IMPRESSION: 1. Progressed airspace disease at the right middle lobe and right base, may reflect atelectasis or pneumonia superimposed on chronic interstitial disease 2. Diffuse fibrosis and emphysematous disease. Bilateral lung nodules corresponding to history of metastatic pulmonary nodules. 3.  Multifocal sclerosis consistent with osseous metastatic disease Electronically Signed   By: Donavan Foil M.D.   On: 03/14/2018 16:12   Ct Angio Chest Pe W Or Wo  Contrast  Result Date: 03/14/2018 CLINICAL DATA:  Increasing shortness of breath. PE suspected, high pretest prob EXAM: CT ANGIOGRAPHY CHEST WITH CONTRAST TECHNIQUE: Multidetector CT imaging of the chest was performed using the standard protocol during bolus administration of intravenous contrast. Multiplanar CT image reconstructions and MIPs were obtained to evaluate the vascular anatomy. CONTRAST:  14mL ISOVUE-370 IOPAMIDOL (ISOVUE-370) INJECTION 76% COMPARISON:  Multiple prior exams including radiographs earlier this day. Most recent chest CTA 3 weeks ago 02/21/2018 FINDINGS: Cardiovascular: Right chest port with tip in the SVC. There are no filling defects within the pulmonary arteries to suggest pulmonary embolus. Thoracic atherosclerosis without aneurysm or dissection. Coronary artery calcifications versus stents. Heart size is normal. No pericardial effusion. Mediastinum/Nodes: Definite progression of mediastinal adenopathy from prior exam with increased number and size of multiple mediastinal nodes. For example highest mediastinal node image 33 series 4 measures 12 mm short axis, previously 7 mm. Right lower paratracheal node, image 46, measures 2.1 cm, previously 1.8 cm. Right hilar adenopathy has progressed and now causes occlusion of the right middle lobe bronchus. Multiple small left hilar nodes are similar. Esophagus is nondilated. Bilateral gynecomastia. No visualized thyroid nodule. Lungs/Pleura: Advanced emphysema. Honeycombing and fibrosis at the bases again seen. Right hilar adenopathy causes complete right middle lobe collapse. Multiple pulmonary nodules. Definite increase in lingular nodule measuring 16 mm image 105 series 10, previously 9 mm. Previous right middle lobe nodule obscured by surrounding collapse. Right basilar lower lobe nodule measures 19 mm, image 117, unchanged. Multiple additional pulmonary nodules have also increased. Peripheral opacity in the left upper and lower lobes  appears similar and is consistent with scarring. Ill-defined right lower lobe perihilar opacities suggest partial collapse rather than pneumonia. No significant pleural fluid. No evidence of pulmonary edema. Upper Abdomen: Bilateral adrenal nodularity as before. Cholelithiasis. Irregular upper abdominal aortic atherosclerosis. No acute findings. Musculoskeletal: Diffuse osseous metastatic disease with mixed lytic and sclerotic lesions. Extraosseous soft tissue extension of metastasis involving left lamina at T7 invades the spinal canal. Also spinal canal involvement from lesion involving right lamina of T9. No acute fracture. Review of the MIP images confirms the above findings. IMPRESSION: 1. No pulmonary embolus. 2. Progression of metastatic disease in the thorax. Progression in right hilar adenopathy causes occlusion of the right middle lobe bronchus and right middle lobe collapse. Additional mediastinal nodes have also increased in size. Increased size of multiple pulmonary metastasis. 3. Diffuse osseous metastatic disease including extraosseous soft tissue involvement of the spinal canal at T7 and T9. 4. Right lower lobe perihilar consolidation may be postobstructive or infectious. 5. Advanced emphysema with basilar fibrosis. 6. Additional findings as described. Aortic Atherosclerosis (ICD10-I70.0) and Emphysema (ICD10-J43.9). Electronically Signed   By: Keith Rake M.D.   On: 03/14/2018 21:56    Chart Review:    Specialty Problems      Pulmonary Problems   DOE (dyspnea on exertion)    Trial of high dose prednisone and off apalutamide 12/15/2017 > rechallenged Jan 04 2018 x one week s recurrence  - Spirometry 12/15/2017  FEV1 1.90 (64%)  Ratio 72 on no rx with fvc 2.65 =65% with no curvature s prior rx >>>>     c/w restriction/ not obstruction   - PFT's  01/12/2018  FEV1 2.37 (79 % ) ratio 78  p 1 %  improvement from saba p nothing prior to study with DLCO  14/16c % corrects to 27  % for alv  volume   - 01/12/2018  Walked RA x 3 laps @ 185 ft each stopped due to  End of study, nl pace, no sob or desat         Pulmonary infiltrates    PF changes date back to at least  11/12/10 CT abd study  - PFT's  01/12/2018  FEV1 2.37 (79 % ) ratio 78  p 1 % improvement from saba p nothing prior to study with DLCO  14/16c % corrects to 27  % for alv volume   - 01/12/2018  Walked RA x 3 laps @ 185 ft each stopped due to  End of study, nl pace, no sob or desat        Chronic respiratory failure with hypoxia [facility])    Hospital admission 03/14/2018 for acute resp failure, O2 sat 83% RA at rest. Requiring 3L oxygen       Emphysema of lung (HCC)    PFT's 01/12/2018>>FEV1 2.37 (79 % ) ratio 78 p 1 % improvement from saba p nothing prior to study with DLCO 14/16c % corrects to 27 % for alv volume   8/22/2019Walked RA x 3 laps normal pace, no sob or desat  CTA Chest 03/14/18 >>negative for PE, progression of metastatic disease in the thorax, progression of right hilar adenopathy with occlusion of the RML bronchus with RML collapse, increase in mediastinal nodes, diffuse osseus metastatic disease including extraossous soft tissue involvement of the spinal canal at T7, T9, RLL perihilar consolidation, advanced emphysema with basilar fibrosis         Allergies  Allergen Reactions  . Oxycodone Other (See Comments)    somnolence Sleep all the time  . Penicillins Anaphylaxis, Hives and Rash    Has patient had a PCN reaction causing immediate rash, facial/tongue/throat swelling, SOB or lightheadedness with hypotension: Yes Has patient had a PCN reaction causing severe rash involving mucus membranes or skin necrosis: No Has patient had a PCN reaction that required hospitalization: No Has patient had a PCN reaction occurring within the last 10 years: Yes If all of the above answers are "NO", then may proceed with Cephalosporin use.   . Codeine Nausea And Vomiting  . Other Nausea Only and Other  (See Comments)    Uncoded Allergy. Allergen: IV contrast  . Hydrocodone-Acetaminophen Nausea And Vomiting  . Iohexol Nausea And Vomiting    Immunization History  Administered Date(s) Administered  . Influenza, High Dose Seasonal PF 03/16/2017  . Pneumococcal Conjugate-13 07/15/2014  . Pneumococcal Polysaccharide-23 05/25/1999    Past Medical History:  Diagnosis Date  . Anemia   . Aneurysm of abdominal aorta (HCC)   . Bone metastasis ([place])   . Cancer [facility]) 2014   prostate  . Chronic hepatitis B virus infection ([place])   . Cirrhosis ([place])   . COPD (chronic obstructive pulmonary disease) ([place])   . Dyspnea   . GERD (gastroesophageal reflux disease)   . GERD (gastroesophageal reflux disease)   . Hand pain   . Hepatitis   . Hypotension   . Lung nodules   . Osteoarthritis     Tobacco History: Social History   Tobacco Use  Smoking Status Former Smoker  . Packs/day: 0.50  . Years: 60.00  . Pack years: 30.00  . Types: Cigarettes  . Last attempt to quit: 12/20/2017  . Years since quitting: 0.2  Smokeless Tobacco  Never Used   Counseling given: Not Answered Continue to not smoke  Outpatient Encounter Medications as of 03/28/2018  Medication Sig  . acetaminophen (TYLENOL) 500 MG tablet Take 500 mg by mouth at bedtime as needed (pain).  . chlorhexidine (PERIDEX) 0.12 % solution Use as directed 15 mLs in the mouth or throat 2 (two) times daily.  . dorzolamide-timolol (COSOPT) 22.3-6.8 MG/ML ophthalmic solution Place 1 drop into the left eye 2 times daily.  . Lidocaine 2 % GEL Apply topically. Apply to gums 4 x days with a qtip  . Menthol-Methyl Salicylate (ICY HOT BALM EXTRA STRENGTH EX) Apply 1 patch topically daily as needed (back pain).  . Morphine Sulfate (MORPHINE CONCENTRATE) 10 MG/0.5ML SOLN concentrated solution Place 0.25 mLs (5 mg total) under the tongue every 2 (two) hours as needed for severe pain or shortness of breath.  . ondansetron (ZOFRAN) 8 MG tablet Take 1  tablet (8 mg total) by mouth 2 (two) times daily as needed for refractory nausea / vomiting.  . prednisoLONE acetate (PRED FORTE) 1 % ophthalmic suspension Place 1 drop into the left eye 4 times daily.  . predniSONE (DELTASONE) 20 MG tablet 2 tabs PO for one week, then 1.5 tabs for one week then 1 tab daily  . Probiotic Product (PROBIOTIC PO) Take 1 capsule by mouth daily.  . prochlorperazine (COMPAZINE) 10 MG tablet Take 10 mg by mouth every 6 (six) hours as needed for nausea or vomiting.  . ranitidine (ZANTAC) 300 MG tablet Take 300 mg by mouth daily as needed for heartburn.   Marland Kitchen albuterol (PROVENTIL HFA;VENTOLIN HFA) 108 (90 Base) MCG/ACT inhaler INAHLE 1 PUFF BY MOUTH Q 4 H PRN  . albuterol (PROVENTIL) (2.5 MG/3ML) 0.083% nebulizer solution USE 1 VIAL IN NEBULIZER EVERY 4 HOURS  . HYDROcodone-acetaminophen (NORCO) 7.5-325 MG tablet Take 1-2 tablets by mouth every 4 (four) hours as needed. for pain  . ipratropium (ATROVENT) 0.02 % nebulizer solution Take 2.5 mLs (0.5 mg total) by nebulization every 6 (six) hours as needed for wheezing or shortness of breath.  . [DISCONTINUED] ipratropium (ATROVENT) 0.02 % nebulizer solution Take 2.5 mLs (0.5 mg total) by nebulization 4 (four) times daily.   Facility-Administered Encounter Medications as of 03/28/2018  Medication  . sodium chloride flush (NS) 0.9 % injection 10 mL     Review of Systems  Review of Systems  Constitutional: Negative for activity change, chills, fatigue, fever and unexpected weight change.  HENT: Positive for congestion. Negative for postnasal drip, rhinorrhea, sinus pressure, sinus pain, sneezing and sore throat.   Eyes: Negative.   Respiratory: Positive for chest tightness and shortness of breath. Negative for cough and wheezing.   Cardiovascular: Positive for chest pain and palpitations.  Gastrointestinal: Negative for constipation, diarrhea, nausea and vomiting.  Endocrine: Negative.   Musculoskeletal: Positive for gait  problem.  Skin: Negative.   Neurological: Negative for dizziness and headaches.  Psychiatric/Behavioral: Negative for dysphoric mood. The patient is nervous/anxious.   All other systems reviewed and are negative.    Physical Exam  BP 98/60 (BP Location: Left Arm, Cuff Size: Small)   Pulse 83   Ht 5\' 9"  (1.753 m)   Wt 120 lb (54.4 kg)   SpO2 95%   BMI 17.72 kg/m   Wt Readings from Last 5 Encounters:  03/28/18 120 lb (54.4 kg)  03/17/18 119 lb 4.3 oz (54.1 kg)  03/14/18 127 lb (57.6 kg)  01/12/18 134 lb (60.8 kg)  01/04/18 129 lb 12 oz (58.9  kg)   4L via [place], increased to 6L  Physical Exam  Constitutional: He is oriented to person, place, and time and well-developed, well-nourished, and in no distress. No distress.  HENT:  Head: Normocephalic and atraumatic.  Right Ear: Hearing, tympanic membrane, external ear and ear canal normal.  Left Ear: Hearing, tympanic membrane, external ear and ear canal normal.  Nose: Nose normal. Right sinus exhibits no maxillary sinus tenderness and no frontal sinus tenderness. Left sinus exhibits no maxillary sinus tenderness and no frontal sinus tenderness.  Mouth/Throat: Uvula is midline and oropharynx is clear and moist. No oropharyngeal exudate.  Eyes: Pupils are equal, round, and reactive to light.  Neck: Normal range of motion. Neck supple. No JVD present.  Cardiovascular: Normal rate, regular rhythm and normal heart sounds.  Pulmonary/Chest: Effort normal. No accessory muscle usage. No respiratory distress. He has no decreased breath sounds. He has wheezes (Slight expiratory wheeze). He has no rhonchi. He has rales (Bibasilar crackles).  Abdominal: Soft. Bowel sounds are normal. There is no tenderness.  Musculoskeletal: Normal range of motion. He exhibits no edema.  Lymphadenopathy:    He has no cervical adenopathy.  Neurological: He is alert and oriented to person, place, and time. Gait normal. Coordination abnormal.  Skin: Skin is warm  and dry. He is not diaphoretic. No erythema.  Psychiatric: Mood, memory, affect and judgment normal.  Nursing note and vitals reviewed.     Lab Results:  CBC    Component Value Date/Time   WBC 12.7 (H) 03/16/2018 0412   RBC 2.76 (L) 03/16/2018 0412   HGB 9.0 (L) 03/16/2018 0412   HGB 10.2 (L) 01/04/2018 0832   HGB 12.7 (L) 10/09/2015 1204   HCT 29.3 (L) 03/16/2018 0412   HCT 37.8 (L) 10/09/2015 1204   PLT 282 03/16/2018 0412   PLT 225 01/04/2018 0832   PLT 243 10/09/2015 1204   MCV 106.2 (H) 03/16/2018 0412   MCV 97.5 10/09/2015 1204   MCH 32.6 03/16/2018 0412   MCHC 30.7 03/16/2018 0412   RDW 15.0 03/16/2018 0412   RDW 15.7 (H) 10/09/2015 1204   LYMPHSABS 0.6 (L) 03/14/2018 1415   LYMPHSABS 0.9 10/09/2015 1204   MONOABS 0.8 03/14/2018 1415   MONOABS 0.7 10/09/2015 1204   EOSABS 0.0 03/14/2018 1415   EOSABS 0.1 10/09/2015 1204   EOSABS 0.1 06/06/2015 1156   BASOSABS 0.0 03/14/2018 1415   BASOSABS 0.0 10/09/2015 1204    BMET    Component Value Date/Time   NA 136 03/16/2018 0412   NA 147 (H) 06/06/2015 1156   K 4.8 03/16/2018 0412   K 5.4 (H) 06/06/2015 1156   CL 103 03/16/2018 0412   CL 106 06/06/2015 1156   CO2 24 03/16/2018 0412   CO2 26 06/06/2015 1156   GLUCOSE 111 (H) 03/17/2018 0334   GLUCOSE 92 06/06/2015 1156   BUN 29 (H) 03/16/2018 0412   BUN 18 06/06/2015 1156   CREATININE 1.22 03/16/2018 0412   CREATININE 1.10 01/04/2018 0832   CREATININE 1.1 06/06/2015 1156   CALCIUM 8.5 (L) 03/16/2018 0412   CALCIUM 9.6 06/06/2015 1156   GFRNONAA 57 (L) 03/16/2018 0412   GFRAA >60 03/16/2018 0412    BNP    Component Value Date/Time   BNP 64.7 03/14/2018 1415    ProBNP No results found for: PROBNP    Assessment & Plan:   72 year old male patient presenting today for hospital follow-up.  I am very concerned for Mr. Bynum.  As I discussed extensively  with his spouse and son and the patient patient is a end-stage emphysema.  Patient also with fibrosis  on CT.  I do not believe the patient needs to be acutely admitted as I do not believe that this would change his plan of care.  I believe the patient needs to proceed forward with palliative care.  We will increase his oxygen needs at this time to 6 L at rest and 8 L with exertion patient can go up to 10 L if needed with exertion.  Will need to change oxygen concentrator.  Patient be discharged today on 6 L via [facility] tank.  We will add Atrovent nebulized solutions as patient reports he cannot take albuterol.  We will bring patient back in 1 week to see Dr. Melvyn Novas as patient would like to see Dr. Melvyn Novas.  Chronic respiratory failure with hypoxia (HCC) Continue follow-up with palliative care >>> Further address CPR status and discussed mechanical ventilation  Will discuss with [facility] increasing her oxygen need to make sure that your concentrator at home >>> Will order 10 L concentrator  Suspect patient will need 6 L at rest and 8 L with exertion  Bring SPO2 to next office visit  >>>make sure hands are warm as possible   Walk/ wheelchair assist in office today to adjust oxygen settings   Follow up with [facility] next week   If oxygen saturations are persistently dropping unable to be maintained with oxygen as prescribed, patient will need to present to emergency room or contact 911  Goals of care, counseling/discussion I discussed with the patient today with his needs and request would be.  Patient would like to have CPR performed him if his heart would stop.  Patient would not like mechanical ventilation if he has shortness of breath and would require so.  Patient would like noninvasive ventilation such as BiPAP.  I have discussed with the patient that this will be very difficult for [place] to maintain as patient is at end-stage of emphysema and respiratory needs.  We are very limited with other agents that we could add to help support him with his breathing.  Continue follow-up  with palliative care    Emphysema of lung (HCC) We will add ipratropium nebulized medications to help with breathing Increased oxygen needs to 6 L at rest and 8 L to 10 L with exertion New oxygen concentrator ordered from [facility] Continue with 20mg  prednisone  Follow-up in 1 week with Dr. Melvyn Novas   This appointment was 48 minutes along with over 50% that time in direct face-to-face patient care, assessment, plan of care discussion, discussion of palliative care and discussion of goals of care, discussion of acute interventions if needed, discussion of oxygen needs.  Lauraine Rinne, NP 03/28/2018

## 2018-03-28 NOTE — Patient Instructions (Addendum)
Continue follow-up with palliative care >>> Further address CPR status and discussed mechanical ventilation  Will discuss with [facility] increasing her oxygen need to make sure that your concentrator at home >>> Will order 10 L concentrator  Suspect patient will need 6 L at rest and 8 L with exertion  Bring SPO2 to next office visit  >>>make sure hands are warm as possible   Walk/ wheelchair assist in office today to adjust oxygen settings   Follow up with [facility] next week   If oxygen saturations are persistently dropping unable to be maintained with oxygen as prescribed, patient will need to present to emergency room or contact 911     November/2019 we will be moving! We will no longer be at our [place] location.  Be on the look out for a post card/mailer to let you know we have officially moved.  Our new address and phone number will be:  [address] [place], [place] [address] Telephone number: [phone]  It is flu season:   >>>Remember to be washing your hands regularly, using hand sanitizer, be careful to use around herself with has contact with people who are sick will increase her chances of getting sick yourself. >>> Best ways to protect herself from the flu: Receive the yearly flu vaccine, practice good hand hygiene washing with soap and also using hand sanitizer when available, eat a nutritious meals, get adequate rest, hydrate appropriately   Please contact the office if your symptoms worsen or you have concerns that you are not improving.   Thank you for choosing [facility] for your healthcare, and for allowing [place] to partner with you on your healthcare journey. I am thankful to be able to provide care to you today.   Wyn Quaker FNP-C

## 2018-03-28 NOTE — Progress Notes (Signed)
Chart and office note reviewed in detail  > agree with a/p as outlined   / nothing to offer at this point but palliative rx and so far pt has refuse offer of hospice care

## 2018-03-30 ENCOUNTER — Telehealth: Payer: Self-pay | Admitting: Pulmonary Disease

## 2018-03-30 MED ORDER — IPRATROPIUM BROMIDE 0.02 % IN SOLN: 0.5000 mg | Freq: Four times a day (QID) | RESPIRATORY_TRACT | 3 refills | Status: AC | PRN

## 2018-03-30 NOTE — Telephone Encounter (Signed)
Called and spoke with Alexander Ellis, [facility] in [place], on [address]  He stated that the Patient would like Atrovent prescription transferred to [facility] from [facility].  For medicare purposes, they require a new prescription, with diagnosis code.  New prescription with diagnosis code sent to [facility] in [place].  Nothing further needed.

## 2018-03-31 ENCOUNTER — Other Ambulatory Visit: Payer: Self-pay

## 2018-03-31 ENCOUNTER — Emergency Department (HOSPITAL_COMMUNITY): Payer: Medicare Other

## 2018-03-31 ENCOUNTER — Encounter (HOSPITAL_COMMUNITY): Payer: Self-pay

## 2018-03-31 ENCOUNTER — Inpatient Hospital Stay (HOSPITAL_COMMUNITY)
Admission: EM | Admit: 2018-03-31 | Discharge: 2018-04-23 | DRG: 193 | Disposition: E | Payer: Medicare Other | Attending: Internal Medicine | Admitting: Internal Medicine

## 2018-03-31 ENCOUNTER — Inpatient Hospital Stay (HOSPITAL_COMMUNITY): Payer: Medicare Other

## 2018-03-31 ENCOUNTER — Inpatient Hospital Stay: Payer: Medicare Other | Admitting: Primary Care

## 2018-03-31 DIAGNOSIS — R0609 Other forms of dyspnea: Secondary | ICD-10-CM

## 2018-03-31 DIAGNOSIS — J9621 Acute and chronic respiratory failure with hypoxia: Secondary | ICD-10-CM | POA: Diagnosis present

## 2018-03-31 DIAGNOSIS — C7800 Secondary malignant neoplasm of unspecified lung: Secondary | ICD-10-CM | POA: Diagnosis present

## 2018-03-31 DIAGNOSIS — J189 Pneumonia, unspecified organism: Principal | ICD-10-CM

## 2018-03-31 DIAGNOSIS — Z9089 Acquired absence of other organs: Secondary | ICD-10-CM

## 2018-03-31 DIAGNOSIS — C799 Secondary malignant neoplasm of unspecified site: Secondary | ICD-10-CM

## 2018-03-31 DIAGNOSIS — J441 Chronic obstructive pulmonary disease with (acute) exacerbation: Secondary | ICD-10-CM | POA: Diagnosis present

## 2018-03-31 DIAGNOSIS — I5030 Unspecified diastolic (congestive) heart failure: Secondary | ICD-10-CM | POA: Diagnosis not present

## 2018-03-31 DIAGNOSIS — Z888 Allergy status to other drugs, medicaments and biological substances status: Secondary | ICD-10-CM

## 2018-03-31 DIAGNOSIS — C7951 Secondary malignant neoplasm of bone: Secondary | ICD-10-CM | POA: Diagnosis present

## 2018-03-31 DIAGNOSIS — J9601 Acute respiratory failure with hypoxia: Secondary | ICD-10-CM

## 2018-03-31 DIAGNOSIS — Z825 Family history of asthma and other chronic lower respiratory diseases: Secondary | ICD-10-CM | POA: Diagnosis not present

## 2018-03-31 DIAGNOSIS — K7460 Unspecified cirrhosis of liver: Secondary | ICD-10-CM | POA: Diagnosis present

## 2018-03-31 DIAGNOSIS — F410 Panic disorder [episodic paroxysmal anxiety] without agoraphobia: Secondary | ICD-10-CM | POA: Diagnosis present

## 2018-03-31 DIAGNOSIS — D539 Nutritional anemia, unspecified: Secondary | ICD-10-CM | POA: Diagnosis present

## 2018-03-31 DIAGNOSIS — I5033 Acute on chronic diastolic (congestive) heart failure: Secondary | ICD-10-CM | POA: Diagnosis present

## 2018-03-31 DIAGNOSIS — J438 Other emphysema: Secondary | ICD-10-CM | POA: Diagnosis not present

## 2018-03-31 DIAGNOSIS — Z87891 Personal history of nicotine dependence: Secondary | ICD-10-CM

## 2018-03-31 DIAGNOSIS — Z880 Allergy status to penicillin: Secondary | ICD-10-CM

## 2018-03-31 DIAGNOSIS — Z885 Allergy status to narcotic agent status: Secondary | ICD-10-CM

## 2018-03-31 DIAGNOSIS — R590 Localized enlarged lymph nodes: Secondary | ICD-10-CM | POA: Diagnosis present

## 2018-03-31 DIAGNOSIS — C61 Malignant neoplasm of prostate: Secondary | ICD-10-CM | POA: Diagnosis present

## 2018-03-31 DIAGNOSIS — R0602 Shortness of breath: Secondary | ICD-10-CM

## 2018-03-31 DIAGNOSIS — K219 Gastro-esophageal reflux disease without esophagitis: Secondary | ICD-10-CM | POA: Diagnosis present

## 2018-03-31 DIAGNOSIS — Z9981 Dependence on supplemental oxygen: Secondary | ICD-10-CM

## 2018-03-31 DIAGNOSIS — Z8249 Family history of ischemic heart disease and other diseases of the circulatory system: Secondary | ICD-10-CM | POA: Diagnosis not present

## 2018-03-31 DIAGNOSIS — Z66 Do not resuscitate: Secondary | ICD-10-CM | POA: Diagnosis present

## 2018-03-31 DIAGNOSIS — Z7952 Long term (current) use of systemic steroids: Secondary | ICD-10-CM | POA: Diagnosis not present

## 2018-03-31 DIAGNOSIS — J440 Chronic obstructive pulmonary disease with acute lower respiratory infection: Secondary | ICD-10-CM | POA: Diagnosis present

## 2018-03-31 DIAGNOSIS — I714 Abdominal aortic aneurysm, without rupture, unspecified: Secondary | ICD-10-CM | POA: Diagnosis present

## 2018-03-31 DIAGNOSIS — Z9049 Acquired absence of other specified parts of digestive tract: Secondary | ICD-10-CM

## 2018-03-31 DIAGNOSIS — J9622 Acute and chronic respiratory failure with hypercapnia: Secondary | ICD-10-CM | POA: Diagnosis present

## 2018-03-31 DIAGNOSIS — R918 Other nonspecific abnormal finding of lung field: Secondary | ICD-10-CM

## 2018-03-31 DIAGNOSIS — Y95 Nosocomial condition: Secondary | ICD-10-CM | POA: Diagnosis present

## 2018-03-31 DIAGNOSIS — E43 Unspecified severe protein-calorie malnutrition: Secondary | ICD-10-CM | POA: Diagnosis present

## 2018-03-31 DIAGNOSIS — J181 Lobar pneumonia, unspecified organism: Secondary | ICD-10-CM

## 2018-03-31 DIAGNOSIS — M1990 Unspecified osteoarthritis, unspecified site: Secondary | ICD-10-CM | POA: Diagnosis present

## 2018-03-31 DIAGNOSIS — J15212 Pneumonia due to Methicillin resistant Staphylococcus aureus: Secondary | ICD-10-CM | POA: Diagnosis not present

## 2018-03-31 DIAGNOSIS — B181 Chronic viral hepatitis B without delta-agent: Secondary | ICD-10-CM | POA: Diagnosis present

## 2018-03-31 DIAGNOSIS — Z515 Encounter for palliative care: Secondary | ICD-10-CM | POA: Diagnosis present

## 2018-03-31 DIAGNOSIS — H4050X0 Glaucoma secondary to other eye disorders, unspecified eye, stage unspecified: Secondary | ICD-10-CM | POA: Diagnosis present

## 2018-03-31 DIAGNOSIS — Z9079 Acquired absence of other genital organ(s): Secondary | ICD-10-CM

## 2018-03-31 DIAGNOSIS — Z7189 Other specified counseling: Secondary | ICD-10-CM | POA: Diagnosis not present

## 2018-03-31 LAB — COMPREHENSIVE METABOLIC PANEL
ALT: 18 U/L (ref 0–44)
ANION GAP: 9 (ref 5–15)
AST: 23 U/L (ref 15–41)
Albumin: 2.5 g/dL — ABNORMAL LOW (ref 3.5–5.0)
Alkaline Phosphatase: 38 U/L (ref 38–126)
BILIRUBIN TOTAL: 0.7 mg/dL (ref 0.3–1.2)
BUN: 21 mg/dL (ref 8–23)
CO2: 26 mmol/L (ref 22–32)
Calcium: 8.3 mg/dL — ABNORMAL LOW (ref 8.9–10.3)
Chloride: 103 mmol/L (ref 98–111)
Creatinine, Ser: 1.06 mg/dL (ref 0.61–1.24)
GFR calc Af Amer: >60 mL/min (ref >60)
Glucose, Bld: 140 mg/dL — ABNORMAL HIGH (ref 70–99)
Potassium: 4.5 mmol/L (ref 3.5–5.1)
Sodium: 138 mmol/L (ref 135–145)
Total Protein: 6.1 g/dL — ABNORMAL LOW (ref 6.5–8.1)

## 2018-03-31 LAB — CBC WITH DIFFERENTIAL/PLATELET
ABS IMMATURE GRANULOCYTES: 0.13 10*3/uL — AB (ref 0.00–0.07)
Basophils Absolute: 0 10*3/uL (ref 0.0–0.1)
Basophils Relative: 0 %
Eosinophils Absolute: 0 10*3/uL (ref 0.0–0.5)
Eosinophils Relative: 0 %
HCT: 31.2 % — ABNORMAL LOW (ref 39.0–52.0)
Hemoglobin: 9.5 g/dL — ABNORMAL LOW (ref 13.0–17.0)
IMMATURE GRANULOCYTES: 1 %
Lymphocytes Relative: 4 %
Lymphs Abs: 0.4 10*3/uL — ABNORMAL LOW (ref 0.7–4.0)
MCH: 33.3 pg (ref 26.0–34.0)
MCHC: 30.4 g/dL (ref 30.0–36.0)
MCV: 109.5 fL — ABNORMAL HIGH (ref 80.0–100.0)
Monocytes Absolute: 0.3 10*3/uL (ref 0.1–1.0)
Monocytes Relative: 3 %
NEUTROS ABS: 9.9 10*3/uL — AB (ref 1.7–7.7)
NEUTROS PCT: 92 %
PLATELETS: 226 10*3/uL (ref 150–400)
RBC: 2.85 MIL/uL — AB (ref 4.22–5.81)
RDW: 15.1 % (ref 11.5–15.5)
WBC: 10.7 10*3/uL — AB (ref 4.0–10.5)
nRBC: 0 % (ref 0.0–0.2)

## 2018-03-31 LAB — TROPONIN I: Troponin I: <0.03 ng/mL (ref <0.03)

## 2018-03-31 LAB — BRAIN NATRIURETIC PEPTIDE: B NATRIURETIC PEPTIDE 5: 93 pg/mL (ref 0.0–100.0)

## 2018-03-31 MED ORDER — FAMOTIDINE 20 MG PO TABS
20.0000 mg | ORAL_TABLET | Freq: Two times a day (BID) | ORAL | Status: DC
  Administered 2018-03-31 – 2018-04-09 (×16): 20 mg via ORAL
  Filled 2018-03-31 (×17): qty 1

## 2018-03-31 MED ORDER — MORPHINE SULFATE (CONCENTRATE) 10 MG/0.5ML PO SOLN: 5.0000 mg | ORAL | Status: DC | PRN

## 2018-03-31 MED ORDER — IPRATROPIUM BROMIDE 0.02 % IN SOLN
0.5000 mg | Freq: Four times a day (QID) | RESPIRATORY_TRACT | Status: DC | PRN
  Administered 2018-04-01: 0.5 mg via RESPIRATORY_TRACT
  Filled 2018-03-31: qty 2.5

## 2018-03-31 MED ORDER — VANCOMYCIN HCL IN DEXTROSE 1-5 GM/200ML-% IV SOLN
1000.0000 mg | Freq: Once | INTRAVENOUS | Status: AC
  Administered 2018-03-31: 1000 mg via INTRAVENOUS
  Filled 2018-03-31: qty 200

## 2018-03-31 MED ORDER — HYDROCORTISONE NA SUCCINATE PF 250 MG IJ SOLR
200.0000 mg | Freq: Once | INTRAMUSCULAR | Status: AC
  Administered 2018-03-31: 200 mg via INTRAVENOUS
  Filled 2018-03-31: qty 200

## 2018-03-31 MED ORDER — PROCHLORPERAZINE MALEATE 10 MG PO TABS
10.0000 mg | ORAL_TABLET | Freq: Four times a day (QID) | ORAL | Status: DC | PRN
  Administered 2018-04-03: 10 mg via ORAL
  Filled 2018-03-31: qty 1

## 2018-03-31 MED ORDER — HYDROCODONE-ACETAMINOPHEN 7.5-325 MG PO TABS
1.0000 | ORAL_TABLET | ORAL | Status: DC | PRN
  Administered 2018-04-06 (×2): 1 via ORAL
  Filled 2018-03-31: qty 1
  Filled 2018-03-31: qty 2
  Filled 2018-03-31: qty 1

## 2018-03-31 MED ORDER — MUSCLE RUB 10-15 % EX CREA
1.0000 "application " | TOPICAL_CREAM | Freq: Every day | CUTANEOUS | Status: DC | PRN
  Filled 2018-03-31: qty 85

## 2018-03-31 MED ORDER — DIPHENHYDRAMINE HCL 50 MG/ML IJ SOLN
50.0000 mg | Freq: Once | INTRAMUSCULAR | Status: AC
  Administered 2018-03-31: 50 mg via INTRAVENOUS
  Filled 2018-03-31: qty 1

## 2018-03-31 MED ORDER — DIPHENHYDRAMINE HCL 50 MG PO CAPS: 50.0000 mg | ORAL_CAPSULE | Freq: Once | ORAL | Status: AC

## 2018-03-31 MED ORDER — ONDANSETRON HCL 4 MG PO TABS
8.0000 mg | ORAL_TABLET | Freq: Two times a day (BID) | ORAL | Status: DC | PRN
  Administered 2018-04-08: 8 mg via ORAL
  Filled 2018-03-31: qty 2

## 2018-03-31 MED ORDER — PREDNISONE 20 MG PO TABS
20.0000 mg | ORAL_TABLET | Freq: Every day | ORAL | Status: DC
  Administered 2018-04-01: 20 mg via ORAL
  Filled 2018-03-31: qty 1

## 2018-03-31 MED ORDER — ENSURE ENLIVE PO LIQD
237.0000 mL | Freq: Two times a day (BID) | ORAL | Status: DC
  Administered 2018-04-01 – 2018-04-03 (×3): 237 mL via ORAL

## 2018-03-31 MED ORDER — PREDNISOLONE ACETATE 1 % OP SUSP
1.0000 [drp] | Freq: Four times a day (QID) | OPHTHALMIC | Status: DC
  Administered 2018-03-31 – 2018-04-09 (×30): 1 [drp] via OPHTHALMIC
  Filled 2018-03-31: qty 5

## 2018-03-31 MED ORDER — VANCOMYCIN HCL IN DEXTROSE 750-5 MG/150ML-% IV SOLN
750.0000 mg | INTRAVENOUS | Status: AC
  Administered 2018-04-01 – 2018-04-04 (×4): 750 mg via INTRAVENOUS
  Filled 2018-03-31 (×4): qty 150

## 2018-03-31 MED ORDER — SODIUM CHLORIDE 0.9 % IV SOLN
2.0000 g | Freq: Once | INTRAVENOUS | Status: AC
  Administered 2018-03-31: 2 g via INTRAVENOUS
  Filled 2018-03-31: qty 2

## 2018-03-31 MED ORDER — DORZOLAMIDE HCL-TIMOLOL MAL 2-0.5 % OP SOLN
1.0000 [drp] | Freq: Two times a day (BID) | OPHTHALMIC | Status: DC
  Administered 2018-03-31 – 2018-04-09 (×16): 1 [drp] via OPHTHALMIC
  Filled 2018-03-31: qty 10

## 2018-03-31 MED ORDER — SODIUM CHLORIDE 0.9% FLUSH: 10.0000 mL | INTRAVENOUS | Status: DC | PRN

## 2018-03-31 MED ORDER — SODIUM CHLORIDE 0.9 % IV SOLN
INTRAVENOUS | Status: DC
  Administered 2018-03-31: 21:00:00 via INTRAVENOUS

## 2018-03-31 MED ORDER — LIDOCAINE-PRILOCAINE 2.5-2.5 % EX CREA
TOPICAL_CREAM | Freq: Once | CUTANEOUS | Status: AC
  Administered 2018-03-31: 18:00:00 via TOPICAL
  Filled 2018-03-31: qty 5

## 2018-03-31 MED ORDER — IOPAMIDOL (ISOVUE-370) INJECTION 76%
INTRAVENOUS | Status: AC
  Filled 2018-03-31: qty 100

## 2018-03-31 MED ORDER — IOPAMIDOL (ISOVUE-370) INJECTION 76%
100.0000 mL | Freq: Once | INTRAVENOUS | Status: AC | PRN
  Administered 2018-03-31: 100 mL via INTRAVENOUS

## 2018-03-31 MED ORDER — CHLORHEXIDINE GLUCONATE 0.12 % MT SOLN
15.0000 mL | Freq: Two times a day (BID) | OROMUCOSAL | Status: DC
  Administered 2018-03-31 – 2018-04-09 (×11): 15 mL via OROMUCOSAL
  Filled 2018-03-31 (×10): qty 15

## 2018-03-31 MED ORDER — RISAQUAD PO CAPS
1.0000 | ORAL_CAPSULE | Freq: Every day | ORAL | Status: DC
  Administered 2018-03-31 – 2018-04-06 (×7): 1 via ORAL
  Filled 2018-03-31 (×7): qty 1

## 2018-03-31 MED ORDER — SODIUM CHLORIDE 0.9 % IV SOLN
2.0000 g | Freq: Three times a day (TID) | INTRAVENOUS | Status: AC
  Administered 2018-03-31 – 2018-04-04 (×12): 2 g via INTRAVENOUS
  Filled 2018-03-31 (×12): qty 2

## 2018-03-31 MED ORDER — ACETAMINOPHEN 500 MG PO TABS
500.0000 mg | ORAL_TABLET | Freq: Three times a day (TID) | ORAL | Status: DC | PRN
  Administered 2018-04-01: 500 mg via ORAL
  Filled 2018-03-31: qty 1

## 2018-03-31 MED ORDER — HEPARIN SODIUM (PORCINE) 5000 UNIT/ML IJ SOLN
5000.0000 [IU] | Freq: Three times a day (TID) | INTRAMUSCULAR | Status: DC
  Administered 2018-04-01 – 2018-04-07 (×19): 5000 [IU] via SUBCUTANEOUS
  Filled 2018-03-31 (×18): qty 1

## 2018-03-31 MED ORDER — SODIUM CHLORIDE (PF) 0.9 % IJ SOLN
INTRAMUSCULAR | Status: AC
  Filled 2018-03-31: qty 50

## 2018-03-31 NOTE — Progress Notes (Signed)
A consult was received from an ED physician for vancomycin per pharmacy dosing.  The patient's profile has been reviewed for ht/wt/allergies/indication/available labs.    A one time order has been placed for vancomycin 1000mg  IV.  Further antibiotics/pharmacy consults should be ordered by admitting physician if indicated.                       Thank you,  Eudelia Bunch, Pharm.D [phone] 04/11/2018 5:48 PM

## 2018-03-31 NOTE — ED Triage Notes (Signed)
Pt arrives from home via [facility]. Pt c/o Shortness of breath and dyspnea. Per EMS: Pt was 80% on 8L o2 at home. Pt is typically on 6L O2. Pt has labored respirations. EMS administered 5mg  Albuterol neb. Pt dc'ed 2 weeks ago for pneumonia. Pt reports productive cough.

## 2018-03-31 NOTE — ED Provider Notes (Signed)
[facility] DEPT Provider Note   CSN: [id number] Arrival date & time: 04/19/2018  1419     History   Chief Complaint Chief Complaint  Patient presents with  . Shortness of Breath    HPI Alexander Ellis is a 72 y.o. male.  72yo M w/ PMH including lung cancer on 6-8L O2, cirrhosis, COPD, prostate cancer, Hep B who p/w shortness of breath. Pt was discharged from the hospital 2 weeks ago after admission for pneumonia. He completed antibiotics in hospital but was discharged on prednisone.  On discharge paperwork, he was supposed to taper the prednisone after week but family member reports that he has stayed on 20 mg prednisone daily.  Over the past few days, he has had increased shortness of breath and has felt like he was wheezing today.  He reports associated cough productive of phlegm.  Shortness of breath is worse with exertion and laying flat.  Currently he feels okay.  He denies any associated fevers, vomiting, diarrhea, or chest pain.  No improvement with albuterol from EMS.  He is normally on 6 L at home but recently had to increase to 8 L.  EMS noted that he was 80% on 8 L at home.  No lower extremity edema.  The history is provided by the patient.    Past Medical History:  Diagnosis Date  . Anemia   . Aneurysm of abdominal aorta (HCC)   . Bone metastasis ([place])   . Cancer [facility]) 2014   prostate  . Chronic hepatitis B virus infection ([place])   . Cirrhosis ([place])   . COPD (chronic obstructive pulmonary disease) ([place])   . Dyspnea   . GERD (gastroesophageal reflux disease)   . GERD (gastroesophageal reflux disease)   . Hand pain   . Hepatitis   . Hypotension   . Lung nodules   . Osteoarthritis     Patient Active Problem List   Diagnosis Date Noted  . Goals of care, counseling/discussion 03/28/2018  . Emphysema of lung ([place]) 03/28/2018  . Metastatic cancer ([place])   . Palliative care encounter   . Chronic respiratory failure with hypoxia ([place])  03/14/2018  . Pulmonary infiltrates 12/16/2017  . DOE (dyspnea on exertion) 12/15/2017  . IDA (iron deficiency anemia) 11/17/2017  . Carotid stenosis 07/26/2017  . 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 Hx of prostatectomy 07/16/2014  . Cigarette smoker 07/16/2014  . Counseling regarding goals of care 07/16/2014  . Constipation 06/04/2014  . AAA (abdominal aortic aneurysm) ([place]) 10/12/2012  . Chronic hepatitis B ([place]) 10/12/2012    Past Surgical History:  Procedure Laterality Date  . APPENDECTOMY    . BACK SURGERY     [phone]  . IR FLUORO GUIDE PORT INSERTION RIGHT  09/06/2017  . IR US GUIDE VASC ACCESS RIGHT  09/06/2017  . PROSTATECTOMY  2012  . THROAT SURGERY    . TONSILLECTOMY          Home Medications    Prior to Admission medications   Medication Sig Start Date End Date Taking? Authorizing Provider  acetaminophen (TYLENOL) 500 MG tablet Take 500 mg by mouth every 8 (eight) hours as needed for moderate pain (pain).    Yes [provider]  chlorhexidine (PERIDEX) 0.12 % solution Use as directed 15 mLs in the mouth or throat 2 (two) times daily.   Yes [provider]  dorzolamide-timolol (COSOPT) 22.3-6.8 MG/ML ophthalmic solution Place 1 drop  into the left eye 2 (two) times daily.  03/08/18 03/08/19 Yes [provider]  ipratropium (ATROVENT) 0.02 % nebulizer solution Take 2.5 mLs (0.5 mg total) by nebulization every 6 (six) hours as needed for wheezing or shortness of breath. 03/30/18  Yes Lauraine Rinne, NP  Lidocaine 2 % GEL Apply topically. Apply to gums 4 x days with a qtip   Yes [provider]  Menthol-Methyl Salicylate (ICY HOT BALM EXTRA STRENGTH EX) Apply 1 patch topically daily as needed (back pain).   Yes [provider]  Morphine Sulfate (MORPHINE CONCENTRATE) 10 MG/0.5ML SOLN concentrated solution Place 0.25 mLs (5 mg total) under the tongue every 2  (two) hours as needed for severe pain or shortness of breath. 03/17/18  Yes Ollis, Brandi L, NP  ondansetron (ZOFRAN) 8 MG tablet Take 1 tablet (8 mg total) by mouth 2 (two) times daily as needed for refractory nausea / vomiting. 02/27/18  Yes Volanda Napoleon, MD  prednisoLONE acetate (PRED FORTE) 1 % ophthalmic suspension Place 1 drop into the left eye 4 (four) times daily.  02/01/18  Yes [provider]  predniSONE (DELTASONE) 20 MG tablet 2 tabs PO for one week, then 1.5 tabs for one week then 1 tab daily Patient taking differently: Take 20 mg by mouth daily with breakfast.  03/17/18  Yes Ollis, Brandi L, NP  Probiotic Product (PROBIOTIC PO) Take 1 capsule by mouth daily.   Yes [provider]  HYDROcodone-acetaminophen (NORCO) 7.5-325 MG tablet Take 1-2 tablets by mouth every 4 (four) hours as needed. for pain 12/08/17   [provider]  prochlorperazine (COMPAZINE) 10 MG tablet Take 10 mg by mouth every 6 (six) hours as needed for nausea or vomiting.    [provider]  ranitidine (ZANTAC) 300 MG tablet Take 300 mg by mouth daily as needed for heartburn.  06/01/17   [provider]    Family History Family History  Problem Relation Age of Onset  . Heart disease Mother   . Hypertension Mother   . Emphysema Father        smoked    Social History Social History   Tobacco Use  . Smoking status: Former Smoker    Packs/day: 0.50    Years: 60.00    Pack years: 30.00    Types: Cigarettes    Last attempt to quit: 12/20/2017    Years since quitting: 0.2  . Smokeless tobacco: Never Used  Substance Use Topics  . Alcohol use: No    Alcohol/week: 0.0 standard drinks  . Drug use: No     Allergies   Oxycodone; Penicillins; Codeine; Other; Hydrocodone-acetaminophen; and Iohexol   Review of Systems Review of Systems All other systems reviewed and are negative except that which was mentioned in HPI   Physical Exam Updated Vital Signs BP (!)  104/56 (BP Location: Left Arm)   Pulse 71   Temp (!) 96.8 F (36 C) (Axillary)   Resp (!) 21   Wt 54.4 kg   SpO2 98%   BMI 17.71 kg/m   Physical Exam  Constitutional: He is oriented to person, place, and time. He appears well-developed and well-nourished. No distress.  Chronically Ill-appearing, no acute distress  HENT:  Head: Normocephalic and atraumatic.  Moist mucous membranes  Eyes: Conjunctivae are normal.  L pupil irregularly shaped  Neck: Neck supple.  Cardiovascular: Normal rate, regular rhythm and normal heart sounds.  No murmur heard. Pulmonary/Chest:  Mild tachypnea, occasional wheeze L lower  lung, no respiratory distress  Abdominal: Soft. Bowel sounds are normal. He exhibits no distension. There is no tenderness.  Musculoskeletal: He exhibits no edema.  Neurological: He is alert and oriented to person, place, and time.  Fluent speech  Skin: Skin is warm and dry.  Port R upper chest without redness or drainage, mild bruising  Psychiatric: He has a normal mood and affect. Judgment normal.  Nursing note and vitals reviewed.    ED Treatments / Results  Labs (all labs ordered are listed, but only abnormal results are displayed) Labs Reviewed  COMPREHENSIVE METABOLIC PANEL - Abnormal; Notable for the following components:      Result Value   Glucose, Bld 140 (*)    Calcium 8.3 (*)    Total Protein 6.1 (*)    Albumin 2.5 (*)    All other components within normal limits  CBC WITH DIFFERENTIAL/PLATELET - Abnormal; Notable for the following components:   WBC 10.7 (*)    RBC 2.85 (*)    Hemoglobin 9.5 (*)    HCT 31.2 (*)    MCV 109.5 (*)    Neutro Abs 9.9 (*)    Lymphs Abs 0.4 (*)    Abs Immature Granulocytes 0.13 (*)    All other components within normal limits  CULTURE, BLOOD (ROUTINE X 2)  CULTURE, BLOOD (ROUTINE X 2)  EXPECTORATED SPUTUM ASSESSMENT W REFEX TO RESP CULTURE  GRAM STAIN  BRAIN NATRIURETIC PEPTIDE  TROPONIN I  HIV ANTIBODY (ROUTINE  TESTING W REFLEX)  STREP PNEUMONIAE URINARY ANTIGEN  COMPREHENSIVE METABOLIC PANEL  CBC    EKG EKG Interpretation  Date/Time:  Friday March 31 2018 14:32:12 EST Ventricular Rate:  75 PR Interval:    QRS Duration: 96 QT Interval:  377 QTC Calculation: 419 R Axis:   45 Text Interpretation:  Sinus rhythm Borderline low voltage, extremity leads ST elevation, consider inferior injury Interpretation limited secondary to artifact Confirmed by Theotis Burrow [phone]) on 03/24/2018 4:07:43 PM   Radiology Dg Chest 2 View  Result Date: 03/26/2018 CLINICAL DATA:  Shortness of breath EXAM: CHEST - 2 VIEW COMPARISON:  03/19/2018 FINDINGS: Porta catheter on the right with tip at the SVC. There is extensive bilateral interstitial and airspace opacity with progressed density in the right mid lung since prior. No effusion or pneumothorax. Normal heart size and mediastinal contours.  Remote rib fractures. IMPRESSION: 1. Increased opacity in the right mid lung, presumably pneumonia. 2. Background of severe emphysema. Electronically Signed   By: Monte Fantasia M.D.   On: 04/15/2018 15:52    Procedures Procedures (including critical care time)  Medications Ordered in ED Medications - No data to display   Initial Impression / Assessment and Plan / ED Course  I have reviewed the triage vital signs and the nursing notes.  Pertinent labs & imaging results that were available during my care of the patient were reviewed by me and considered in my medical decision making (see chart for details).    Non-toxic on exam, VS stable. No respiratory distress. CXR shows increased opacity R mid lung, ?pneumonia.  Lab work shows reassuring CMP, WBC 10.7, hemoglobin 9.5, normal BNP and troponin.  Gave the patient broad antibiotics to cover HCAP given recent hospitalization and abnormal CXR. DDx includes PE, post-obstructive pneumonia, aspiration pneumonia, worsening metastatic disease.  I have ordered a CT angio of  the chest for better evaluation but the patient will require pretreatment due to contrast allergy of nausea and vomiting.  I have ordered the  medications for pretreatment.  Discussed admission with [facility] hospitalist, Dr. Jonelle Sidle, appreciate assistance.  Patient admitted for further work-up and care.  Final Clinical Impressions(s) / ED Diagnoses   Final diagnoses:  None    ED Discharge Orders    None       Surya Schroeter, Wenda Overland, MD 04/13/2018 2333

## 2018-03-31 NOTE — ED Notes (Signed)
Bed: WA20 Expected date:  Expected time:  Means of arrival:  Comments: 72 yo SOB, Lung  CA

## 2018-03-31 NOTE — ED Notes (Signed)
Patient transported to X-ray 

## 2018-03-31 NOTE — ED Notes (Signed)
ED TO INPATIENT HANDOFF REPORT  Name/Age/Gender Alexander Ellis 72 y.o. male  Code Status Code Status History    Date Active Date Inactive Code Status Order ID Comments User Context   03/15/2018 1438 03/17/2018 1900 DNR [id number]  Marshell Garfinkel, MD Inpatient   03/14/2018 1251 03/15/2018 1438 Full Code [id number]  Melvenia Needles, NP Inpatient    Questions for Most Recent Historical Code Status (Order [id number])    Question Answer Comment   In the event of cardiac or respiratory ARREST Do not call a "code blue"    In the event of cardiac or respiratory ARREST Do not perform Intubation, CPR, defibrillation or ACLS    In the event of cardiac or respiratory ARREST Use medication by any route, position, wound care, and other measures to relive pain and suffering. May use oxygen, suction and manual treatment of airway obstruction as needed for comfort.         Advance Directive Documentation     Most Recent Value  Type of Advance Directive  Out of facility DNR (pink MOST or yellow form), Healthcare Power of Attorney  Pre-existing out of facility DNR order (yellow form or pink MOST form)  -  "MOST" Form in Place?  -      Home/SNF/Other Home  Chief Complaint shob  Level of Care/Admitting Diagnosis ED Disposition    ED Disposition Condition [place]: [facility] [100102]  Level of Care: Med-Surg [16]  Diagnosis: Pneumonia [227785]  Admitting Physician: Elwyn Reach [[id number]]  Attending Physician: Elwyn Reach [[id number]]  Estimated length of stay: past midnight tomorrow  Certification:: I certify this patient will need inpatient services for at least 2 midnights  PT Class (Do Not Modify): Inpatient [101]  PT Acc Code (Do Not Modify): Private [1]       Medical History Past Medical History:  Diagnosis Date  . Anemia   . Aneurysm of abdominal aorta (HCC)   . Bone metastasis ([place])   . Cancer [facility]) 2014   prostate  . Chronic  hepatitis B virus infection ([place])   . Cirrhosis ([place])   . COPD (chronic obstructive pulmonary disease) ([place])   . Dyspnea   . GERD (gastroesophageal reflux disease)   . GERD (gastroesophageal reflux disease)   . Hand pain   . Hepatitis   . Hypotension   . Lung nodules   . Osteoarthritis     Allergies Allergies  Allergen Reactions  . Oxycodone Other (See Comments)    somnolence Sleep all the time  . Penicillins Anaphylaxis, Hives and Rash    Has patient had a PCN reaction causing immediate rash, facial/tongue/throat swelling, SOB or lightheadedness with hypotension: Yes Has patient had a PCN reaction causing severe rash involving mucus membranes or skin necrosis: No Has patient had a PCN reaction that required hospitalization: No Has patient had a PCN reaction occurring within the last 10 years: Yes If all of the above answers are "NO", then may proceed with Cephalosporin use.   . Codeine Nausea And Vomiting  . Other Nausea Only and Other (See Comments)    Uncoded Allergy. Allergen: IV contrast  . Hydrocodone-Acetaminophen Nausea And Vomiting  . Iohexol Nausea And Vomiting    IV Location/Drains/Wounds Patient Lines/Drains/Airways Status   Active Line/Drains/Airways    Name:   Placement date:   Placement time:   Site:   Days:   Implanted Port 09/06/17 Right Chest   09/06/17    1401  Chest   206   Implanted Port 03/14/18 Right Chest   03/14/18    1328    Chest   17   Peripheral IV 04/19/2018 Right Forearm   04/18/2018    -    Forearm   less than 1          Labs/Imaging Results for orders placed or performed during the hospital encounter of 04/01/2018 (from the past 48 hour(s))  Comprehensive metabolic panel     Status: Abnormal   Collection Time: 03/29/2018  4:11 PM  Result Value Ref Range   Sodium 138 135 - 145 mmol/L   Potassium 4.5 3.5 - 5.1 mmol/L   Chloride 103 98 - 111 mmol/L   CO2 26 22 - 32 mmol/L   Glucose, Bld 140 (H) 70 - 99 mg/dL   BUN 21 8 - 23 mg/dL    Creatinine, Ser 1.06 0.61 - 1.24 mg/dL   Calcium 8.3 (L) 8.9 - 10.3 mg/dL   Total Protein 6.1 (L) 6.5 - 8.1 g/dL   Albumin 2.5 (L) 3.5 - 5.0 g/dL   AST 23 15 - 41 U/L   ALT 18 0 - 44 U/L   Alkaline Phosphatase 38 38 - 126 U/L   Total Bilirubin 0.7 0.3 - 1.2 mg/dL   GFR calc non Af Amer >60 >60 mL/min   GFR calc Af Amer >60 >60 mL/min    Comment: (NOTE) The eGFR has been calculated using the CKD EPI equation. This calculation has not been validated in all clinical situations. eGFR's persistently <60 mL/min signify possible Chronic Kidney Disease.    Anion gap 9 5 - 15    Comment: Performed at [facility], [place] [address], [place], [place] [address]  CBC with Differential     Status: Abnormal   Collection Time: 04/08/2018  4:11 PM  Result Value Ref Range   WBC 10.7 (H) 4.0 - 10.5 K/uL   RBC 2.85 (L) 4.22 - 5.81 MIL/uL   Hemoglobin 9.5 (L) 13.0 - 17.0 g/dL   HCT 31.2 (L) 39.0 - 52.0 %   MCV 109.5 (H) 80.0 - 100.0 fL   MCH 33.3 26.0 - 34.0 pg   MCHC 30.4 30.0 - 36.0 g/dL   RDW 15.1 11.5 - 15.5 %   Platelets 226 150 - 400 K/uL   nRBC 0.0 0.0 - 0.2 %   Neutrophils Relative % 92 %   Neutro Abs 9.9 (H) 1.7 - 7.7 K/uL   Lymphocytes Relative 4 %   Lymphs Abs 0.4 (L) 0.7 - 4.0 K/uL   Monocytes Relative 3 %   Monocytes Absolute 0.3 0.1 - 1.0 K/uL   Eosinophils Relative 0 %   Eosinophils Absolute 0.0 0.0 - 0.5 K/uL   Basophils Relative 0 %   Basophils Absolute 0.0 0.0 - 0.1 K/uL   Immature Granulocytes 1 %   Abs Immature Granulocytes 0.13 (H) 0.00 - 0.07 K/uL    Comment: Performed at [facility], [place] [address], [place], [place] [address]  Brain natriuretic peptide     Status: None   Collection Time: 04/05/2018  4:11 PM  Result Value Ref Range   B Natriuretic Peptide 93.0 0.0 - 100.0 pg/mL    Comment: Performed at [facility], [place] [address], [place], [place] [address]  Troponin I Once     Status: None   Collection Time:  04/15/2018  4:11 PM  Result Value Ref Range   Troponin I <0.03 <0.03 ng/mL  Comment: Performed at [facility], [place] [address], [place], [place] [address]   Dg Chest 2 View  Result Date: 03/30/2018 CLINICAL DATA:  Shortness of breath EXAM: CHEST - 2 VIEW COMPARISON:  03/19/2018 FINDINGS: Porta catheter on the right with tip at the SVC. There is extensive bilateral interstitial and airspace opacity with progressed density in the right mid lung since prior. No effusion or pneumothorax. Normal heart size and mediastinal contours.  Remote rib fractures. IMPRESSION: 1. Increased opacity in the right mid lung, presumably pneumonia. 2. Background of severe emphysema. Electronically Signed   By: Monte Fantasia M.D.   On: 03/27/2018 15:52   EKG Interpretation  Date/Time:  Friday March 31 2018 14:32:12 EST Ventricular Rate:  75 PR Interval:    QRS Duration: 96 QT Interval:  377 QTC Calculation: 419 R Axis:   45 Text Interpretation:  Sinus rhythm Borderline low voltage, extremity leads ST elevation, consider inferior injury Interpretation limited secondary to artifact Confirmed by Theotis Burrow [phone]) on 04/13/2018 4:07:43 PM   Pending Labs Unresulted Labs (From admission, onward)   None      Vitals/Pain Today's Vitals   04/07/2018 1500 04/11/2018 1610 03/26/2018 1730 04/09/2018 1830  BP: 95/67 97/66 114/68 (!) 114/52  Pulse: 74 79 73 77  Resp: 20 17 (!) 22 (!) 21  Temp:      TempSrc:      SpO2: 92% 94% 96% 91%  Weight:      PainSc:        Isolation Precautions No active isolations  Medications Medications  diphenhydrAMINE (BENADRYL) capsule 50 mg (has no administration in time range)    Or  diphenhydrAMINE (BENADRYL) injection 50 mg (has no administration in time range)  vancomycin (VANCOCIN) IVPB 1000 mg/200 mL premix (has no administration in time range)  hydrocortisone sodium succinate (SOLU-CORTEF) injection 200 mg (200 mg Intravenous Given 04/13/2018 1812)   aztreonam (AZACTAM) 2 g in sodium chloride 0.9 % 100 mL IVPB (0 g Intravenous Stopped 03/29/2018 1823)  lidocaine-prilocaine (EMLA) cream ( Topical Given 03/28/2018 1756)    Mobility Walks normally but exertional SOB and hasn't walked since he arrived.

## 2018-03-31 NOTE — ED Notes (Signed)
Respiratory notified need for pt evaluation

## 2018-03-31 NOTE — Progress Notes (Signed)
Pharmacy Antibiotic Note  Alexander Ellis is a 72 y.o. male admitted on 04/19/2018 with pneumonia.  Pharmacy has been consulted for Vancomycin dosing, renal dosing of Aztreonam. PMH significant for COPD on home O2, ILD, pulmonary infiltrates/nodules, prostate cancer with progressive, widespread mets, recent hospital admission for pneumonia treated with Levaquin 10/22-10/23.  Noted PCN allergy (anaphylaxis, hives, rash) - no documented cephalosporin or PCN use in [facility].  SCr 1.06 WBC 10.7  Plan:  Aztreonam 2g IV q8h  Vancomycin 1g IV x1 in ED, then 750 mg IV q24h.  (SCr 1.06, est AUC 488)  Check vancomycin levels if remains on vancomycin > 3-4 days.  Goal AUC 400-500.  Follow up renal fxn, culture results, and clinical course.  F/u ability to de-escalate antibiotics.   Weight: 119 lb 14.9 oz (54.4 kg)  Temp (24hrs), Avg:97.3 F (36.3 C), Min:96.8 F (36 C), Max:97.8 F (36.6 C)  Recent Labs  Lab 04/11/2018 1611  WBC 10.7*  CREATININE 1.06    Estimated Creatinine Clearance: 47 mL/min (by C-G formula based on SCr of 1.06 mg/dL).    Allergies  Allergen Reactions  . Oxycodone Other (See Comments)    somnolence Sleep all the time  . Penicillins Anaphylaxis, Hives and Rash    Has patient had a PCN reaction causing immediate rash, facial/tongue/throat swelling, SOB or lightheadedness with hypotension: Yes Has patient had a PCN reaction causing severe rash involving mucus membranes or skin necrosis: No Has patient had a PCN reaction that required hospitalization: No Has patient had a PCN reaction occurring within the last 10 years: Yes If all of the above answers are "NO", then may proceed with Cephalosporin use.   . Codeine Nausea And Vomiting  . Other Nausea Only and Other (See Comments)    Uncoded Allergy. Allergen: IV contrast  . Hydrocodone-Acetaminophen Nausea And Vomiting  . Iohexol Nausea And Vomiting    Antimicrobials this admission: 11/8 Vancomycin >> 11/8  Aztreonam >>   Dose adjustments this admission:   Microbiology results: 11/8 BCx:   Sputum:    MRSA PCR:   Thank you for allowing pharmacy to be a part of this patient's care.  Gretta Arab PharmD, BCPS Pager [phone] 03/30/2018 8:13 PM

## 2018-03-31 NOTE — H&P (Signed)
History and Physical   Alexander Ellis BZJ:[id number] DOB: 1946-03-07 DOA: 04/07/2018  Referring MD/NP/PA: Dr Rex Kras  PCP: [name], NP   Outpatient Specialists:  Dr Burney Gauze  Patient coming from: Home  Chief Complaint: Shortness of breath  HPI: Alexander Ellis is a 72 y.o. male with medical history significant of COPD, prostate cancer with bone metastasis, lung cancer, hepatitis B, GERD, abdominal aortic aneurysm, who was in the hospital until 2 weeks ago when he was discharged home.  He was admitted then with COPD exacerbation.  He is on 6 to 8 L of oxygen at home.  He had pneumonia also during that hospitalization.  Patient completed IV antibiotics and steroids.  He continues to take prednisone up until today.  In the last few days he has continues to have significant shortness of breath and wheezing.  Also cough which is productive of yellow sputum.  He has had significant exertional dyspnea and even while staying stable in bed.  No fever or chills no nausea vomiting no diarrhea.  Patient has had chest discomfort with the cough.  He has tried his home breathing treatment with no help.  In the ER he was noted to have oxygen sats of 80% on 8 L initially.  Patient given breathing treatments and is being admitted for pneumonia that was found in his right mid lobe.  ED Course: Temperature 96.8 blood pressure 140/68 pulse 79 respirate 25 oxygen sat 98% room air.  White count is 10.7 hemoglobin 9.5 and platelet 226.  Sodium 138 potassium 4.5 chloride 103 CO2 26 BUN 21 creatinine 1.06 and calcium 8.3.  Glucose 140.  Chest x-ray showed increased opacity in the right midlung presumably pneumonia with background of severe emphysema.  CT angiogram of the chest showed no evidence of PE mainly diffuse increase in findings of pneumonia and some pulmonary edema  Review of Systems: As per HPI otherwise 10 point review of systems negative.    Past Medical History:  Diagnosis Date  . Anemia   . Aneurysm of  abdominal aorta (HCC)   . Bone metastasis ([place])   . Cancer [facility]) 2014   prostate  . Chronic hepatitis B virus infection ([place])   . Cirrhosis ([place])   . COPD (chronic obstructive pulmonary disease) ([place])   . Dyspnea   . GERD (gastroesophageal reflux disease)   . GERD (gastroesophageal reflux disease)   . Hand pain   . Hepatitis   . Hypotension   . Lung nodules   . Osteoarthritis     Past Surgical History:  Procedure Laterality Date  . APPENDECTOMY    . BACK SURGERY     [phone]  . IR FLUORO GUIDE PORT INSERTION RIGHT  09/06/2017  . IR US GUIDE VASC ACCESS RIGHT  09/06/2017  . PROSTATECTOMY  2012  . THROAT SURGERY    . TONSILLECTOMY       reports that he quit smoking about 3 months ago. His smoking use included cigarettes. He has a 30.00 pack-year smoking history. He has never used smokeless tobacco. He reports that he does not drink alcohol or use drugs.  Allergies  Allergen Reactions  . Oxycodone Other (See Comments)    somnolence Sleep all the time  . Penicillins Anaphylaxis, Hives and Rash    Has patient had a PCN reaction causing immediate rash, facial/tongue/throat swelling, SOB or lightheadedness with hypotension: Yes Has patient had a PCN reaction causing severe rash involving mucus membranes or skin necrosis: No Has patient had  a PCN reaction that required hospitalization: No Has patient had a PCN reaction occurring within the last 10 years: Yes If all of the above answers are "NO", then may proceed with Cephalosporin use.   . Codeine Nausea And Vomiting  . Other Nausea Only and Other (See Comments)    Uncoded Allergy. Allergen: IV contrast  . Hydrocodone-Acetaminophen Nausea And Vomiting  . Iohexol Nausea And Vomiting    Family History  Problem Relation Age of Onset  . Heart disease Mother   . Hypertension Mother   . Emphysema Father        smoked     Prior to Admission medications   Medication Sig Start Date End Date Taking? Authorizing  Provider  acetaminophen (TYLENOL) 500 MG tablet Take 500 mg by mouth every 8 (eight) hours as needed for moderate pain (pain).    Yes [provider]  chlorhexidine (PERIDEX) 0.12 % solution Use as directed 15 mLs in the mouth or throat 2 (two) times daily.   Yes [provider]  dorzolamide-timolol (COSOPT) 22.3-6.8 MG/ML ophthalmic solution Place 1 drop into the left eye 2 (two) times daily.  03/08/18 03/08/19 Yes [provider]  ipratropium (ATROVENT) 0.02 % nebulizer solution Take 2.5 mLs (0.5 mg total) by nebulization every 6 (six) hours as needed for wheezing or shortness of breath. 03/30/18  Yes Lauraine Rinne, NP  Lidocaine 2 % GEL Apply topically. Apply to gums 4 x days with a qtip   Yes [provider]  Menthol-Methyl Salicylate (ICY HOT BALM EXTRA STRENGTH EX) Apply 1 patch topically daily as needed (back pain).   Yes [provider]  Morphine Sulfate (MORPHINE CONCENTRATE) 10 MG/0.5ML SOLN concentrated solution Place 0.25 mLs (5 mg total) under the tongue every 2 (two) hours as needed for severe pain or shortness of breath. 03/17/18  Yes Ollis, Brandi L, NP  ondansetron (ZOFRAN) 8 MG tablet Take 1 tablet (8 mg total) by mouth 2 (two) times daily as needed for refractory nausea / vomiting. 02/27/18  Yes Volanda Napoleon, MD  prednisoLONE acetate (PRED FORTE) 1 % ophthalmic suspension Place 1 drop into the left eye 4 (four) times daily.  02/01/18  Yes [provider]  predniSONE (DELTASONE) 20 MG tablet 2 tabs PO for one week, then 1.5 tabs for one week then 1 tab daily Patient taking differently: Take 20 mg by mouth daily with breakfast.  03/17/18  Yes Ollis, Brandi L, NP  Probiotic Product (PROBIOTIC PO) Take 1 capsule by mouth daily.   Yes [provider]  HYDROcodone-acetaminophen (NORCO) 7.5-325 MG tablet Take 1-2 tablets by mouth every 4 (four) hours as needed. for pain 12/08/17   [provider]  prochlorperazine  (COMPAZINE) 10 MG tablet Take 10 mg by mouth every 6 (six) hours as needed for nausea or vomiting.    [provider]  ranitidine (ZANTAC) 300 MG tablet Take 300 mg by mouth daily as needed for heartburn.  06/01/17   [provider]    Physical Exam: Vitals:   04/04/2018 1610 04/03/2018 1730 04/04/2018 1830 04/07/2018 1949  BP: 97/66 114/68 (!) 114/52 111/61  Pulse: 79 73 77 72  Resp: 17 (!) 22 (!) 21 (!) 22  Temp:    97.8 F (36.6 C)  TempSrc:    Oral  SpO2: 94% 96% 91% 92%  Weight:          Constitutional: NAD, calm, comfortable, chronically ill looking with a Port-A-Cath in place Vitals:  04/01/2018 1610 03/26/2018 1730 04/20/2018 1830 04/14/2018 1949  BP: 97/66 114/68 (!) 114/52 111/61  Pulse: 79 73 77 72  Resp: 17 (!) 22 (!) 21 (!) 22  Temp:    97.8 F (36.6 C)  TempSrc:    Oral  SpO2: 94% 96% 91% 92%  Weight:       Eyes: PERRL, lids and conjunctivae normal ENMT: Mucous membranes are moist. Posterior pharynx clear of any exudate or lesions.Normal dentition.  Neck: normal, supple, no masses, no thyromegaly Respiratory: Increased respiratory effort, mild expiratory wheezing with diffuse rhonchi. No accessory muscle use.  Cardiovascular: Regular rate and rhythm, no murmurs / rubs / gallops. No extremity edema. 2+ pedal pulses. No carotid bruits.  Abdomen: no tenderness, no masses palpated. No hepatosplenomegaly. Bowel sounds positive.  Musculoskeletal: no clubbing / cyanosis. No joint deformity upper and lower extremities. Good ROM, no contractures. Normal muscle tone.  Skin: no rashes, lesions, ulcers. No induration Neurologic: CN 2-12 grossly intact. Sensation intact, DTR normal. Strength 5/5 in all 4.  Psychiatric: Normal judgment and insight. Alert and oriented x 3. Normal mood.     Labs on Admission: I have personally reviewed following labs and imaging studies  CBC: Recent Labs  Lab 04/03/2018 1611  WBC 10.7*  NEUTROABS 9.9*  HGB 9.5*  HCT 31.2*  MCV  109.5*  PLT 161   Basic Metabolic Panel: Recent Labs  Lab 04/20/2018 1611  NA 138  K 4.5  CL 103  CO2 26  GLUCOSE 140*  BUN 21  CREATININE 1.06  CALCIUM 8.3*   GFR: Estimated Creatinine Clearance: 47 mL/min (by C-G formula based on SCr of 1.06 mg/dL). Liver Function Tests: Recent Labs  Lab 04/02/2018 1611  AST 23  ALT 18  ALKPHOS 38  BILITOT 0.7  PROT 6.1*  ALBUMIN 2.5*   No results for input(s): LIPASE, AMYLASE in the last 168 hours. No results for input(s): AMMONIA in the last 168 hours. Coagulation Profile: No results for input(s): INR, PROTIME in the last 168 hours. Cardiac Enzymes: Recent Labs  Lab 03/30/2018 1611  TROPONINI <0.03   BNP (last 3 results) No results for input(s): PROBNP in the last 8760 hours. HbA1C: No results for input(s): HGBA1C in the last 72 hours. CBG: No results for input(s): GLUCAP in the last 168 hours. Lipid Profile: No results for input(s): CHOL, HDL, LDLCALC, TRIG, CHOLHDL, LDLDIRECT in the last 72 hours. Thyroid Function Tests: No results for input(s): TSH, T4TOTAL, FREET4, T3FREE, THYROIDAB in the last 72 hours. Anemia Panel: No results for input(s): VITAMINB12, FOLATE, FERRITIN, TIBC, IRON, RETICCTPCT in the last 72 hours. Urine analysis:    Component Value Date/Time   COLORURINE YELLOW 03/14/2018 [place] 03/14/2018 1709   LABSPEC 1.024 03/14/2018 1709   PHURINE 6.0 03/14/2018 1709   GLUCOSEU NEGATIVE 03/14/2018 1709   HGBUR MODERATE (A) 03/14/2018 1709   BILIRUBINUR NEGATIVE 03/14/2018 1709   KETONESUR NEGATIVE 03/14/2018 1709   PROTEINUR 30 (A) 03/14/2018 1709   UROBILINOGEN 0.2 09/06/2007 1440   NITRITE NEGATIVE 03/14/2018 1709   LEUKOCYTESUR NEGATIVE 03/14/2018 1709   Sepsis Labs: @LABRCNTIP (procalcitonin:4,lacticidven:4) )No results found for this or any previous visit (from the past 240 hour(s)).   Radiological Exams on Admission: Dg Chest 2 View  Result Date: 04/20/2018 CLINICAL DATA:   Shortness of breath EXAM: CHEST - 2 VIEW COMPARISON:  03/19/2018 FINDINGS: Porta catheter on the right with tip at the SVC. There is extensive bilateral interstitial and airspace opacity with progressed density in  the right mid lung since prior. No effusion or pneumothorax. Normal heart size and mediastinal contours.  Remote rib fractures. IMPRESSION: 1. Increased opacity in the right mid lung, presumably pneumonia. 2. Background of severe emphysema. Electronically Signed   By: Monte Fantasia M.D.   On: 04/16/2018 15:52   Ct Angio Chest Pe W/cm &/or Wo Cm  Result Date: 04/11/2018 CLINICAL DATA:  72 y/o M; shortness of breath and dyspnea. Diagnose 2 weeks ago for pneumonia. Metastatic cancer. EXAM: CT ANGIOGRAPHY CHEST WITH CONTRAST TECHNIQUE: Multidetector CT imaging of the chest was performed using the standard protocol during bolus administration of intravenous contrast. Multiplanar CT image reconstructions and MIPs were obtained to evaluate the vascular anatomy. CONTRAST:  161mL ISOVUE-370 IOPAMIDOL (ISOVUE-370) INJECTION 76% COMPARISON:  03/14/2018 CT chest. FINDINGS: Cardiovascular: Satisfactory opacification of the pulmonary arteries. Respiratory motion artifact. No central, lobar, or proximal segmental pulmonary embolus. Downstream pulmonary arteries are poorly assessed due to motion artifact. Normal caliber thoracic aorta and main pulmonary artery. Mild calcific atherosclerosis of the aorta and moderate coronary artery calcific atherosclerosis. Normal heart size. No pericardial effusion. Mediastinum/Nodes: Stable extensive mediastinal and hilar adenopathy, for example a right lower paratracheal node measuring 21 mm short axis (series 4, image 39) and a left prevascular node measuring 12 mm (series 4, image 25). Patent central airways. Normal thoracic esophagus. Lungs/Pleura: Interval diffuse increase in interlobular septal thickening and small bilateral pleural effusions. Pulmonary metastasis are  increased in size, for example a nodule in the left upper lobe lingula measuring 20 mm, previously 16 mm (series 6, image 90) and a nodule at the right lung base measuring 25 mm, previously 19 mm (series 6, image 111). Right perihilar consolidations are decreased in comparison with the prior study. Background of severe emphysema. Upper Abdomen: Cholelithiasis. Partially visualized renal cyst. Stable bilateral nonspecific adrenal nodules. Musculoskeletal: Diffuse bony metastatic disease. Extra osseous extension surrounding the left T7 facet, diffusely at T9/10, left anterior T11. Mild T11 anterior compression deformity is stable. No acute osseous abnormality identified. Review of the MIP images confirms the above findings. IMPRESSION: 1. Respiratory motion artifact. No central, lobar, or proximal segmental pulmonary embolus. Downstream pulmonary arteries are poorly assessed due to motion artifact. 2. Interval diffuse increase in interlobular septal thickening and small bilateral pleural effusions compatible with pulmonary edema. 3. Mildly improved right perihilar consolidations which may represent postobstructive pneumonitis or pneumonia. 4. Mild increased size of pulmonary metastasis. Stable extensive mediastinal and hilar adenopathy. 5. Grossly stable diffuse bony metastatic disease. 6. Stable nonspecific adrenal nodules, likely metastasis. 7. Background of severe emphysema, aortic atherosclerosis, coronary artery calcifications. Electronically Signed   By: Kristine Garbe M.D.   On: 03/24/2018 23:18    EKG: Independently reviewed.  Sinus rhythm with a rate of 79 with nonspecific ST depressions in the lateral leads.  Assessment/Plan Principal Problem:   Pneumonia Active Problems:   AAA (abdominal aortic aneurysm) (HCC)   Chronic hepatitis B (HCC)   Prostate cancer metastatic to bone (HCC)   DOE (dyspnea on exertion)     #1 Healthcare associated pneumonia: Patient is penicillin allergic.  He  was recently in the hospital with new infiltrate in the right middle lobe.  Patient will be placed on vancomycin and cefepime.  We will continue oral prednisone.  Blood cultures and sputum cultures obtained.  Will follow results.  #2 history of metastatic prostate cancer: Continue care from oncology.  #3 mild congestive heart failure: based on CT scan.  Avoid excessive fluids.  Gentle diuresis.  #  4 chronic hepatitis B: Stable at baseline.   DVT prophylaxis: Heparin Code Status: DNR Family Communication: Wife Disposition Plan: Home Consults called: None Admission status: Inpatient  Severity of Illness: The appropriate patient status for this patient is INPATIENT. Inpatient status is judged to be reasonable and necessary in order to provide the required intensity of service to ensure the patient's safety. The patient's presenting symptoms, physical exam findings, and initial radiographic and laboratory data in the context of their chronic comorbidities is felt to place them at high risk for further clinical deterioration. Furthermore, it is not anticipated that the patient will be medically stable for discharge from the hospital within 2 midnights of admission. The following factors support the patient status of inpatient.   " The patient's presenting symptoms include shortness of breath and cough. " The worrisome physical exam findings include frail and chronically ill looking with bilateral rhonchi. " The initial radiographic and laboratory data are worrisome because of evidence of pneumonia on x-ray and CT scan. " The chronic co-morbidities include metastatic prostate cancer.   * I certify that at the point of admission it is my clinical judgment that the patient will require inpatient hospital care spanning beyond 2 midnights from the point of admission due to high intensity of service, high risk for further deterioration and high frequency of surveillance required.Barbette Merino  MD [facility] Pager [phone]  If 7PM-7AM, please contact night-coverage [email or web address] Password [email or web address]  04/20/2018, 11:35 PM

## 2018-03-31 NOTE — ED Notes (Signed)
Family at bedside. 

## 2018-03-31 NOTE — ED Notes (Signed)
Pt received hydrocortisone at 1812 and needs to receive benadryl at 2112. When you get ready to administer the benadryl or just before call CT and let them know so they can work him in within that hour. Pt needs port accessed for CT angio per CT, they did not like the 20g that the pt currently has. Emla cream is on the port waiting to be accessed.

## 2018-04-01 DIAGNOSIS — I714 Abdominal aortic aneurysm, without rupture: Secondary | ICD-10-CM

## 2018-04-01 DIAGNOSIS — J15212 Pneumonia due to Methicillin resistant Staphylococcus aureus: Secondary | ICD-10-CM

## 2018-04-01 DIAGNOSIS — J9601 Acute respiratory failure with hypoxia: Secondary | ICD-10-CM

## 2018-04-01 LAB — COMPREHENSIVE METABOLIC PANEL
ALBUMIN: 2 g/dL — AB (ref 3.5–5.0)
ALT: 14 U/L (ref 0–44)
ANION GAP: 6 (ref 5–15)
AST: 15 U/L (ref 15–41)
Alkaline Phosphatase: 31 U/L — ABNORMAL LOW (ref 38–126)
BILIRUBIN TOTAL: 0.3 mg/dL (ref 0.3–1.2)
BUN: 21 mg/dL (ref 8–23)
CO2: 25 mmol/L (ref 22–32)
Calcium: 7.6 mg/dL — ABNORMAL LOW (ref 8.9–10.3)
Chloride: 102 mmol/L (ref 98–111)
Creatinine, Ser: 0.89 mg/dL (ref 0.61–1.24)
GFR calc Af Amer: >60 mL/min (ref >60)
GFR calc non Af Amer: >60 mL/min (ref >60)
GLUCOSE: 126 mg/dL — AB (ref 70–99)
POTASSIUM: 4.4 mmol/L (ref 3.5–5.1)
SODIUM: 133 mmol/L — AB (ref 135–145)
TOTAL PROTEIN: 5.3 g/dL — AB (ref 6.5–8.1)

## 2018-04-01 LAB — BLOOD GAS, ARTERIAL
Acid-Base Excess: 3.4 mmol/L — ABNORMAL HIGH (ref 0.0–2.0)
Bicarbonate: 26.2 mmol/L (ref 20.0–28.0)
Drawn by: 257881
FIO2: 60
O2 Saturation: 78.4 %
Patient temperature: 98.6
pCO2 arterial: 33.8 mmHg (ref 32.0–48.0)
pH, Arterial: 7.501 — ABNORMAL HIGH (ref 7.350–7.450)
pO2, Arterial: 43.9 mmHg — ABNORMAL LOW (ref 83.0–108.0)

## 2018-04-01 LAB — EXPECTORATED SPUTUM ASSESSMENT W GRAM STAIN, RFLX TO RESP C

## 2018-04-01 LAB — CBC
HCT: 26.3 % — ABNORMAL LOW (ref 39.0–52.0)
HEMOGLOBIN: 8.2 g/dL — AB (ref 13.0–17.0)
MCH: 33.1 pg (ref 26.0–34.0)
MCHC: 31.2 g/dL (ref 30.0–36.0)
MCV: 106 fL — ABNORMAL HIGH (ref 80.0–100.0)
NRBC: 0 % (ref 0.0–0.2)
Platelets: 205 10*3/uL (ref 150–400)
RBC: 2.48 MIL/uL — ABNORMAL LOW (ref 4.22–5.81)
RDW: 14.8 % (ref 11.5–15.5)
WBC: 8.2 10*3/uL (ref 4.0–10.5)

## 2018-04-01 LAB — TROPONIN I: Troponin I: <0.03 ng/mL

## 2018-04-01 LAB — HIV ANTIBODY (ROUTINE TESTING W REFLEX): HIV Screen 4th Generation wRfx: NONREACTIVE

## 2018-04-01 LAB — STREP PNEUMONIAE URINARY ANTIGEN: Strep Pneumo Urinary Antigen: NEGATIVE

## 2018-04-01 LAB — MRSA PCR SCREENING: MRSA by PCR: NEGATIVE

## 2018-04-01 LAB — PROCALCITONIN: Procalcitonin: <0.1 ng/mL

## 2018-04-01 LAB — GLUCOSE, CAPILLARY: GLUCOSE-CAPILLARY: 164 mg/dL — AB (ref 70–99)

## 2018-04-01 LAB — EXPECTORATED SPUTUM ASSESSMENT W REFEX TO RESP CULTURE

## 2018-04-01 MED ORDER — FUROSEMIDE 10 MG/ML IJ SOLN
40.0000 mg | Freq: Once | INTRAMUSCULAR | Status: DC
  Filled 2018-04-01: qty 4

## 2018-04-01 MED ORDER — IPRATROPIUM-ALBUTEROL 0.5-2.5 (3) MG/3ML IN SOLN
3.0000 mL | RESPIRATORY_TRACT | Status: DC | PRN
  Administered 2018-04-01: 3 mL via RESPIRATORY_TRACT
  Filled 2018-04-01: qty 3

## 2018-04-01 MED ORDER — GUAIFENESIN ER 600 MG PO TB12
1200.0000 mg | ORAL_TABLET | Freq: Two times a day (BID) | ORAL | Status: DC
  Administered 2018-04-01 – 2018-04-09 (×15): 1200 mg via ORAL
  Filled 2018-04-01 (×16): qty 2

## 2018-04-01 MED ORDER — BUDESONIDE 0.25 MG/2ML IN SUSP
0.2500 mg | Freq: Two times a day (BID) | RESPIRATORY_TRACT | Status: DC
  Administered 2018-04-01 – 2018-04-06 (×11): 0.25 mg via RESPIRATORY_TRACT
  Filled 2018-04-01 (×11): qty 2

## 2018-04-01 MED ORDER — METHYLPREDNISOLONE SODIUM SUCC 40 MG IJ SOLR
40.0000 mg | Freq: Every day | INTRAMUSCULAR | Status: DC
  Administered 2018-04-01 – 2018-04-03 (×3): 40 mg via INTRAVENOUS
  Filled 2018-04-01 (×3): qty 1

## 2018-04-01 MED ORDER — IPRATROPIUM-ALBUTEROL 0.5-2.5 (3) MG/3ML IN SOLN
3.0000 mL | Freq: Three times a day (TID) | RESPIRATORY_TRACT | Status: DC
  Administered 2018-04-01 – 2018-04-07 (×19): 3 mL via RESPIRATORY_TRACT
  Filled 2018-04-01 (×19): qty 3

## 2018-04-01 MED ORDER — ARFORMOTEROL TARTRATE 15 MCG/2ML IN NEBU
15.0000 ug | INHALATION_SOLUTION | Freq: Two times a day (BID) | RESPIRATORY_TRACT | Status: DC
  Administered 2018-04-01 – 2018-04-07 (×12): 15 ug via RESPIRATORY_TRACT
  Filled 2018-04-01 (×13): qty 2

## 2018-04-01 MED ORDER — FUROSEMIDE 10 MG/ML IJ SOLN
20.0000 mg | Freq: Two times a day (BID) | INTRAMUSCULAR | Status: DC
  Administered 2018-04-01: 20 mg via INTRAVENOUS
  Filled 2018-04-01: qty 2

## 2018-04-01 NOTE — Progress Notes (Signed)
Writer alerted pt's spouse, [name], via phone, that pt has been moved to ICU.

## 2018-04-01 NOTE — Progress Notes (Signed)
MD aware of pt's VS. Pt came from home with O2 at 2 l/min via [place] chronic. Respiratory has seen pt and pt remains on 8 l/min O2 via [place]. Have addressed pt's code status with MD. Pt now a limited code. Will continue to monitor closely.

## 2018-04-01 NOTE — Consult Note (Signed)
NAME:  Alexander Ellis, MRN:  [id number], DOB:  07-21-1945, LOS: 1 ADMISSION DATE:  04/22/2018, CONSULTATION DATE:  04/01/18 REFERRING MD:  Raiford Noble, DO CHIEF COMPLAINT:  Acute on chronic hypoxemic respiratory failure  Brief History   72 year old male with history of prostate cancer with bone and metastasis, COPD, acute on chronic hypoxemic respiratory failure on 6 to 8 L of O2, chronic prednisone therapy, abdominal aortic aneurysm, hepatitis B and GERD who was admitted for COPD exacerbation. Developed respiratory distress and hypoxemia requiring rebreather. Critical care consulted for acute hypoxemic respiratory failure.  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, emphysema, GERD, abdominal aortic aneurysm Significant Hospital Events   11/8 admitted COPD exacerbation secondary pneumonia 11/9 Transferred to ICU for acute hypoxemic respiratory failure  Consults: date of consult/date signed off & final recs:  11/9 PCCM  Procedures (surgical and bedside):    Significant Diagnostic Tests:  CTA 11/8-intralobular septal thickening, small bilateral pleural effusions right perihilar airspace disease  Micro Data:  BCx 11/8>>>  Antimicrobials:  Vanc 11/8>>> Aztreonam 11/8>>>  Subjective:  Reports shortness of breath and cough  Objective   Blood pressure (!) 151/75, pulse 94, temperature (!) 97.5 F (36.4 C), temperature source Oral, resp. rate (!) 32, weight 54.4 kg, SpO2 (!) 82 %.        Intake/Output Summary (Last 24 hours) at 04/01/2018 1715 Last data filed at 04/01/2018 1530 Gross per 24 hour  Intake 1051.31 ml  Output 1475 ml  Net -423.69 ml   Filed Weights   04/20/2018 1433  Weight: 54.4 kg    Examination: General: Chronically ill-appearing male, laying in bed HENT: [place], AT, EOMI, no scleral icterus Lungs: Diminished breath sounds bilaterally, no wheezes or rhonchi Cardiovascular: RRR, no M/G/R Abdomen: BS+, NTTP Extremities: No edema, distal  pulses present Neuro: AAO x4, CN II to XII grossly intact  Resolved Hospital Problem list   None Assessment & Plan:   Acute on chronic hypoxemic respiratory failure: Wears 6 to 8 L O2 at home.  Became hypoxemic on 8 L O2 on the floor and required nonrebreather.  Chest imaging mistreated airspace disease concerning for postobstructive pneumonia --Obtain ABG, troponin, sputum culture --Continue high flow nasal cannula with O2 saturation goal 88 to 92% --Continue empiric antibiotics: Vancomycin and aztreonam (anaphylaxis to penicillins) --Discontinue continuous IVF --S/p Lasix 20 mg OTO. Hold further diuresis until TTE obtained  COPD exacerbation COPD, FEV1 79% Emphysema --Continue systemic steroids x5 days.  On chronic prednisone 20 mg daily at home --Complete antibiotic course as above --Bronchodilators --TTE  Metastatic prostate cancer Oncology has previously recommended comfort care as patient is no longer candidate for systemic chemotherapy.  He has previously met with palliative as an inpatient and declined palliative care services at home --Continue to address goals of care  CODE STATUS: Patient wishes to have CPR and pressors however no intubation.  Primary team discussed with patient prior to transfer to ICU.  Disposition / Summary of Today's Plan 04/01/18   Same as above     Diet: NPO Pain/Anxiety/Delirium protocol (if indicated): N/A VAP protocol (if indicated): N/A DVT prophylaxis: Subcu heparin GI prophylaxis: Pepcid Hyperglycemia protocol: Glucose q4h Mobility: Bedrest Code Status: Patient wishes to have CPR however no intubation.  Primary team discussed with patient prior to transfer to ICU. Family Communication: Wife  Labs   CBC: Recent Labs  Lab 04/06/2018 1611 04/01/18 0431  WBC 10.7* 8.2  NEUTROABS 9.9*  --   HGB 9.5* 8.2*  HCT 31.2* 26.3*  MCV 109.5* 106.0*  PLT 226 573    Basic Metabolic Panel: Recent Labs  Lab 04/20/2018 1611 04/01/18 0431    NA 138 133*  K 4.5 4.4  CL 103 102  CO2 26 25  GLUCOSE 140* 126*  BUN 21 21  CREATININE 1.06 0.89  CALCIUM 8.3* 7.6*   GFR: Estimated Creatinine Clearance: 56 mL/min (by C-G formula based on SCr of 0.89 mg/dL). Recent Labs  Lab 03/27/2018 1611 04/01/18 0431  WBC 10.7* 8.2    Liver Function Tests: Recent Labs  Lab 03/27/2018 1611 04/01/18 0431  AST 23 15  ALT 18 14  ALKPHOS 38 31*  BILITOT 0.7 0.3  PROT 6.1* 5.3*  ALBUMIN 2.5* 2.0*   No results for input(s): LIPASE, AMYLASE in the last 168 hours. No results for input(s): AMMONIA in the last 168 hours.  ABG    Component Value Date/Time   PHART 7.495 (H) 03/14/2018 1300   PCO2ART 28.7 (L) 03/14/2018 1300   PO2ART 54.6 (L) 03/14/2018 1300   HCO3 21.9 03/14/2018 1300   TCO2 22 07/19/2014 1317   ACIDBASEDEF 0.2 03/14/2018 1300   O2SAT 87.9 03/14/2018 1300     Coagulation Profile: No results for input(s): INR, PROTIME in the last 168 hours.  Cardiac Enzymes: Recent Labs  Lab 04/12/2018 1611  TROPONINI <0.03    HbA1C: No results found for: HGBA1C  CBG: No results for input(s): GLUCAP in the last 168 hours.  Admitting History of Present Illness.   Alexander Ellis is a 72 year old male with history of prostate cancer with bone and metastasis, COPD, acute on chronic hypoxemic respiratory failure on 6 to 8 L of O2, abdominal aortic aneurysm, hepatitis B and GERD who was admitted for COPD exacerbation.  Of note, patient had recent COPD exacerbation secondary to pneumonia requiring hospitalization.  He reports for the last 2 weeks he has had productive cough, exertional dyspnea and chest pain.  He tried breathing treatments at home with no relief.  In the ED he was found to be hypoxemic to the 80s on 8 L O2.  Chest x-ray concerning for right middle lobe pneumonia.  CTA negative for PE.  He was admitted to [facility] service for further management of COPD exacerbation secondary to healthcare associated  pneumonia.  Today, he developed worsening respiratory symptoms. On the floor he became hypoxemic  Review of Systems:   Review of Systems  Constitutional: Positive for malaise/fatigue and weight loss. Negative for chills and fever.  HENT: Negative for congestion.   Eyes: Negative for blurred vision.  Respiratory: Positive for cough, sputum production, shortness of breath and wheezing. Negative for hemoptysis.   Cardiovascular: Positive for chest pain, orthopnea and PND. Negative for leg swelling.  Gastrointestinal: Positive for nausea.  Genitourinary: Negative for frequency.  Musculoskeletal: Positive for myalgias.  Skin: Negative for rash.  Neurological: Positive for weakness. Negative for focal weakness and headaches.  Psychiatric/Behavioral: The patient is not nervous/anxious.      Past Medical History  He,  has a past medical history of Anemia, Aneurysm of abdominal aorta ([place]), Bone metastasis ([place]), Cancer ([place]) (2014), Chronic hepatitis B virus infection ([place]), Cirrhosis ([place]), COPD (chronic obstructive pulmonary disease) ([place]), Dyspnea, GERD (gastroesophageal reflux disease), GERD (gastroesophageal reflux disease), Hand pain, Hepatitis, Hypotension, Lung nodules, and Osteoarthritis.   Surgical History    Past Surgical History:  Procedure Laterality Date  . APPENDECTOMY    . BACK SURGERY     [phone]  . IR  FLUORO GUIDE PORT INSERTION RIGHT  09/06/2017  . IR US GUIDE VASC ACCESS RIGHT  09/06/2017  . PROSTATECTOMY  2012  . THROAT SURGERY    . TONSILLECTOMY       Social History   Social History   Socioeconomic History  . Marital status: Married    Spouse name: Not on file  . Number of children: Not on file  . Years of education: Not on file  . Highest education level: Not on file  Occupational History  . Not on file  Social Needs  . Financial resource strain: Not on file  . Food insecurity:    Worry: Not on file    Inability: Not on file  .  Transportation needs:    Medical: Not on file    Non-medical: Not on file  Tobacco Use  . Smoking status: Former Smoker    Packs/day: 0.50    Years: 60.00    Pack years: 30.00    Types: Cigarettes    Last attempt to quit: 12/20/2017    Years since quitting: 0.2  . Smokeless tobacco: Never Used  Substance and Sexual Activity  . Alcohol use: No    Alcohol/week: 0.0 standard drinks  . Drug use: No  . Sexual activity: Not on file  Lifestyle  . Physical activity:    Days per week: Not on file    Minutes per session: Not on file  . Stress: Not on file  Relationships  . Social connections:    Talks on phone: Not on file    Gets together: Not on file    Attends religious service: Not on file    Active member of club or organization: Not on file    Attends meetings of clubs or organizations: Not on file    Relationship status: Not on file  . Intimate partner violence:    Fear of current or ex partner: Not on file    Emotionally abused: Not on file    Physically abused: Not on file    Forced sexual activity: Not on file  Other Topics Concern  . Not on file  Social History Narrative  . Not on file  ,  reports that he quit smoking about 3 months ago. His smoking use included cigarettes. He has a 30.00 pack-year smoking history. He has never used smokeless tobacco. He reports that he does not drink alcohol or use drugs.   Family History   His family history includes Emphysema in his father; Heart disease in his mother; Hypertension in his mother.   Allergies Allergies  Allergen Reactions  . Oxycodone Other (See Comments)    somnolence Sleep all the time  . Penicillins Anaphylaxis, Hives and Rash    Has patient had a PCN reaction causing immediate rash, facial/tongue/throat swelling, SOB or lightheadedness with hypotension: Yes Has patient had a PCN reaction causing severe rash involving mucus membranes or skin necrosis: No Has patient had a PCN reaction that required  hospitalization: No Has patient had a PCN reaction occurring within the last 10 years: Yes If all of the above answers are "NO", then may proceed with Cephalosporin use.   . Codeine Nausea And Vomiting  . Other Nausea Only and Other (See Comments)    Uncoded Allergy. Allergen: IV contrast  . Hydrocodone-Acetaminophen Nausea And Vomiting  . Iohexol Nausea And Vomiting     Home Medications  Prior to Admission medications   Medication Sig Start Date End Date Taking? Authorizing Provider  acetaminophen (  TYLENOL) 500 MG tablet Take 500 mg by mouth every 8 (eight) hours as needed for moderate pain (pain).    Yes [provider]  chlorhexidine (PERIDEX) 0.12 % solution Use as directed 15 mLs in the mouth or throat 2 (two) times daily.   Yes [provider]  dorzolamide-timolol (COSOPT) 22.3-6.8 MG/ML ophthalmic solution Place 1 drop into the left eye 2 (two) times daily.  03/08/18 03/08/19 Yes [provider]  ipratropium (ATROVENT) 0.02 % nebulizer solution Take 2.5 mLs (0.5 mg total) by nebulization every 6 (six) hours as needed for wheezing or shortness of breath. 03/30/18  Yes Lauraine Rinne, NP  Lidocaine 2 % GEL Apply topically. Apply to gums 4 x days with a qtip   Yes [provider]  Menthol-Methyl Salicylate (ICY HOT BALM EXTRA STRENGTH EX) Apply 1 patch topically daily as needed (back pain).   Yes [provider]  Morphine Sulfate (MORPHINE CONCENTRATE) 10 MG/0.5ML SOLN concentrated solution Place 0.25 mLs (5 mg total) under the tongue every 2 (two) hours as needed for severe pain or shortness of breath. 03/17/18  Yes Ollis, Brandi L, NP  ondansetron (ZOFRAN) 8 MG tablet Take 1 tablet (8 mg total) by mouth 2 (two) times daily as needed for refractory nausea / vomiting. 02/27/18  Yes Volanda Napoleon, MD  prednisoLONE acetate (PRED FORTE) 1 % ophthalmic suspension Place 1 drop into the left eye 4 (four) times daily.  02/01/18  Yes [provider]  predniSONE (DELTASONE) 20 MG tablet 2 tabs PO for one week, then 1.5 tabs for one week then 1 tab daily Patient taking differently: Take 20 mg by mouth daily with breakfast.  03/17/18  Yes Ollis, Brandi L, NP  Probiotic Product (PROBIOTIC PO) Take 1 capsule by mouth daily.   Yes [provider]  HYDROcodone-acetaminophen (NORCO) 7.5-325 MG tablet Take 1-2 tablets by mouth every 4 (four) hours as needed. for pain 12/08/17   [provider]  prochlorperazine (COMPAZINE) 10 MG tablet Take 10 mg by mouth every 6 (six) hours as needed for nausea or vomiting.    [provider]  ranitidine (ZANTAC) 300 MG tablet Take 300 mg by mouth daily as needed for heartburn.  06/01/17   [provider]     Critical care time: 48    The patient is critically ill with multiple organ systems failure and requires high complexity decision making for assessment and support, frequent evaluation and titration of therapies, application of advanced monitoring technologies and extensive interpretation of multiple databases.   Critical Care Time devoted to patient care services described in this note is  60 Minutes. This time reflects time of care of this signee Dr. Rodman Pickle. This critical care time does not reflect procedure time, or teaching time or supervisory time of PA/NP/Med student/Med Resident etc but could involve care discussion time.  Rodman Pickle, M.D. [facility] Pulmonary/Critical Care Medicine. Pager: [phone]. After hours pager: [phone].

## 2018-04-01 NOTE — Progress Notes (Signed)
PROGRESS NOTE    [name]  MKL:[id number] DOB: [birth year] DOA: 04/09/2018 PCP: [name], NP   Brief Narrative:  HPI per Dr. Jossie Ng on 04/09/2018 Alexander Ellis is a 72 y.o. male with medical history significant of COPD, prostate cancer with bone metastasis, lung cancer, hepatitis B, GERD, abdominal aortic aneurysm, who was in the hospital until 2 weeks ago when he was discharged home.  He was admitted then with COPD exacerbation.  He is on 6 to 8 L of oxygen at home.  He had pneumonia also during that hospitalization.  Patient completed IV antibiotics and steroids.  He continues to take prednisone up until today.  In the last few days he has continues to have significant shortness of breath and wheezing.  Also cough which is productive of yellow sputum.  He has had significant exertional dyspnea and even while staying stable in bed.  No fever or chills no nausea vomiting no diarrhea.  Patient has had chest discomfort with the cough.  He has tried his home breathing treatment with no help.  In the ER he was noted to have oxygen sats of 80% on 8 L initially.  Patient given breathing treatments and is being admitted for pneumonia that was found in his right mid lobe.  ED Course: Temperature 96.8 blood pressure 140/68 pulse 79 respirate 25 oxygen sat 98% room air.  White count is 10.7 hemoglobin 9.5 and platelet 226.  Sodium 138 potassium 4.5 chloride 103 CO2 26 BUN 21 creatinine 1.06 and calcium 8.3.  Glucose 140.  Chest x-ray showed increased opacity in the right midlung presumably pneumonia with background of severe emphysema.  CT angiogram of the chest showed no evidence of PE mainly diffuse increase in findings of pneumonia and some pulmonary edema  **Patient decompensated this afternoon became very hypoxic and short of breath on his home regimen of 8 L.  Desaturated on high 60s and was transferred to the stepdown unit for further evaluation and monitoring.  I discussed the case with Dr. Rodman Pickle of PCCM will see the patient in consultation and appreciate any further recommendations and evaluation.  Assessment & Plan:   Principal Problem:   Pneumonia Active Problems:   AAA (abdominal aortic aneurysm) (HCC)   Chronic hepatitis B (HCC)   Prostate cancer metastatic to bone (HCC)   DOE (dyspnea on exertion)  Multifactorial acute on chronic respiratory failure with hypoxia in the setting of severe emphysematous COPD along with metastatic prostate cancer with mets to the lungs and volume overload from suspected CHF and HCAP pneumonia -Patient normally wears 6-8 L at home and became hypoxic on 8 L today and had to be placed on a nonrebreather and transferred to the stepdown unit for further monitoring and evaluation; now on heated high flow nasal cannula -PCCM/pulmonary consulted for further recommendations -CTA of the chest showed no PE (central/lobar/proximal segmental) PE but did show interval diffuse increase in intralobular septal thickening and small bilateral pleural effusion compatible with pulmonary edema.  There is also mildly improved right perihilar consolidations which may be related to a post obstructive pneumonitis or pneumonia.  Patient did have increase in size of pulmonary metastasis and extensive mediastinal and hilar adenopathy along with grossly stable bony diffuse metastatic disease -Discussed with the patient about CODE STATUS and he wants to be a partial code and wants everything except intubation -Continue with duo nebs 3 mL 3 times daily along with 3 mL's every 4 PRN for wheezing or shortness  breath -Continue with budesonide 0.25 mg nebulized twice daily -Continue with guaifenesin 1200 mg p.o. twice daily-given Solu-Cortef 20 mg IV once yesterday and I discussed with PCCM and they will be changing the patient to methylprednisolone -Check echocardiogram -Diuresis as able.  Blood pressures were low so patient was started on IV twice daily 20 mg however blood  pressure improved so will give an additional 40 this afternoon -Patient takes 20 mg of prednisone daily breakfast however this may be changed to IV Solu-Medrol -Broad-spectrum antibiotics with Azactam due to penicillin allergy and vancomycin  -Continue monitor respiratory status very carefully and maintain O2 saturations greater than 88% -Repeat chest x-ray in the a.m. -Swallow screen -Procalcitonin level was less than 0.10 back in 03/16/2018 and may be beneficial to repeat and follow -Outpatient pulmonary notes revealed that they were in the process of increasing his oxygen requirements with a home concentrator with 10 L -Recently saw pulmonary 03/28/2018 hospital to be brought back within a week to see Dr. Melvyn Novas to adjust his oxygen settings but unfortunately got hospitalized  Metastatic prostate cancer to the lungs as well as a bone -Is post 2 chemotherapy cycles with Taxotere 3 (D/C'D) -Last PSA had gone up to 244 -Will notify Dr. Marin Olp via Epic -Patient's oncologist recommended comfort care and he is no longer candidate for systemic chemotherapy as his ECOG score is not good enough and Dr. Marin Olp felt like he had less than 20% chance of helping with systemic therapy -Continue pain control with hydrocodone-acetaminophen seven-point 5-325 mg 1 to 2 tablets every 4 hours PRN for moderate pain pain with ondansetron 8 mg p.o. twice daily for refractory nausea or vomiting along with prochlorperazine 10 mg p.o. every 6 hours as needed for nausea vomiting  Neovascular Glaucoma -Continue with dorzolamide-timolol 1 drop in the left eye twice daily and prednisolone acetate 1 drop to left eye 4 times daily  GERD -Continue with famotidine 20 mg p.o. twice daily  Pulmonary Edema ? CHF -Rule out CHF -BNP was not very elevated however chest CT scan showed pulmonary edema -Diuresis as able -Strict I's and O's and daily weights -Stopped IV fluid hydration -Axis IV fluid hydration -Patient is  negative for 23.7 L since admission but was +541 yesterday  Long-term tobacco user -Recently quit in July -Congratulated the patient on stopping smoking  Chronic hepatitis B virus infection -Table and at baseline  Liver cirrhosis -May need abdominal ultrasound -Yearly LFTs are normal -Did not appear to have ascites on clinical examination  Leukocytosis -Improved.  WBC went from 10.7 is now able to -Continue monitor for signs and symptoms of infection -Continue with antibiotic coverage -Repeat CBC in AM   Macrocytic Anemia -Hemoglobin/hematocrit went from 9.5/31.2 and is now 8.2/26.3 -Check anemia panel in a.m. -Daily monitor for signs and symptoms of bleeding -Repeat CBC in a.m.   DVT prophylaxis: Heparin 5,000 units sq q8h Code Status: Partial Code; wants everything except intubation Family Communication: No Family present at bedside Disposition Plan:   Consultants:   PCCM/Pulmonary   Procedures: None ECHOCARDIOGRAM Ordered    Antimicrobials:  Anti-infectives (From admission, onward)   Start     Dose/Rate Route Frequency Ordered Stop   04/01/18 2200  vancomycin (VANCOCIN) IVPB 750 mg/150 ml premix     750 mg 150 mL/hr over 60 Minutes Intravenous Every 24 hours 04/02/2018 2116     04/01/18 0000  aztreonam (AZACTAM) 2 g in sodium chloride 0.9 % 100 mL IVPB     2 g  200 mL/hr over 30 Minutes Intravenous Every 8 hours 04/02/2018 2011 04/08/18 2359   03/25/2018 1745  vancomycin (VANCOCIN) IVPB 1000 mg/200 mL premix     1,000 mg 200 mL/hr over 60 Minutes Intravenous  Once 03/29/2018 1734 03/29/2018 2221   04/17/2018 1730  aztreonam (AZACTAM) 2 g in sodium chloride 0.9 % 100 mL IVPB     2 g 200 mL/hr over 30 Minutes Intravenous  Once 04/15/2018 1725 03/30/2018 1823     Subjective: Seen and examined states that he whenever he moves it, very short of breath.  No chest pain, lightheadedness or dizziness but shortness of breath is what bothers him the most.  I had a lengthy discussion  about CODE STATUS and patient states that he wants everything done except patient.  No nausea or vomiting.  No other concerns or complaints at this time  Later in the afternoon patient decompensated and was placed on a nonrebreather and transferred to the stepdown unit for further close monitoring.  PCCM Dr. Rodman Pickle was consulted for further evaluation.  Objective: Vitals:   04/01/18 1655 04/01/18 1709 04/01/18 1722 04/01/18 1727  BP: (!) 151/75     Pulse: 94  99 96  Resp: (!) 32 (!) 30 19 (!) 22  Temp: (!) 97.5 F (36.4 C)     TempSrc: Oral     SpO2: (!) 82% 100% 97% 94%  Weight:        Intake/Output Summary (Last 24 hours) at 04/01/2018 1735 Last data filed at 04/01/2018 1530 Gross per 24 hour  Intake 1051.31 ml  Output 1475 ml  Net -423.69 ml   Filed Weights   04/05/2018 1433  Weight: 54.4 kg   Examination: Physical Exam:  Constitutional: Thin frail, cachectic ill appearing Caucasian male in some respiratory distress and appears anxious and uncomfortable Eyes: Lids and conjunctivae normal, sclerae anicteric  ENMT: External Ears, Nose appear normal. Slightly hard of hearing.  Patient has poor dentition Neck: Appears normal, supple, no cervical masses, normal ROM, no appreciable thyromegaly; no JVD restated Respiratory: Diminished to auscultation bilaterally with coarse breath sounds and diffuse wheezing and rhonchi.  Has increased respiratory effort and has accessory muscle usage and require more oxygen than baseline.   Cardiovascular: RRRbut on the faster side, no murmurs / rubs / gallops. S1 and S2 auscultated. No extremity edema  Abdomen: Soft, non-tender, non-distended. No masses palpated. No appreciable hepatosplenomegaly. Bowel sounds positive x4.  GU: Deferred. Musculoskeletal: No clubbing / cyanosis of digits/nails.. Normal strength and muscle tone.  Skin: No rashes, lesions, ulcers limited skin evaluation. No induration; Warm and dry.  Neurologic: CN 2-12 grossly  intact with no focal deficits. Romberg sign and cerebellar reflexes not assessed.  Psychiatric: Normal judgment and insight. Alert and oriented x 3. Anxious mood and appropriate affect.   Data Reviewed: I have personally reviewed following labs and imaging studies  CBC: Recent Labs  Lab 03/25/2018 1611 04/01/18 0431  WBC 10.7* 8.2  NEUTROABS 9.9*  --   HGB 9.5* 8.2*  HCT 31.2* 26.3*  MCV 109.5* 106.0*  PLT 226 694   Basic Metabolic Panel: Recent Labs  Lab 03/28/2018 1611 04/01/18 0431  NA 138 133*  K 4.5 4.4  CL 103 102  CO2 26 25  GLUCOSE 140* 126*  BUN 21 21  CREATININE 1.06 0.89  CALCIUM 8.3* 7.6*   GFR: Estimated Creatinine Clearance: 56 mL/min (by C-G formula based on SCr of 0.89 mg/dL). Liver Function Tests: Recent Labs  Lab 04/19/2018 1611  04/01/18 0431  AST 23 15  ALT 18 14  ALKPHOS 38 31*  BILITOT 0.7 0.3  PROT 6.1* 5.3*  ALBUMIN 2.5* 2.0*   No results for input(s): LIPASE, AMYLASE in the last 168 hours. No results for input(s): AMMONIA in the last 168 hours. Coagulation Profile: No results for input(s): INR, PROTIME in the last 168 hours. Cardiac Enzymes: Recent Labs  Lab 04/07/2018 1611  TROPONINI <0.03   BNP (last 3 results) No results for input(s): PROBNP in the last 8760 hours. HbA1C: No results for input(s): HGBA1C in the last 72 hours. CBG: No results for input(s): GLUCAP in the last 168 hours. Lipid Profile: No results for input(s): CHOL, HDL, LDLCALC, TRIG, CHOLHDL, LDLDIRECT in the last 72 hours. Thyroid Function Tests: No results for input(s): TSH, T4TOTAL, FREET4, T3FREE, THYROIDAB in the last 72 hours. Anemia Panel: No results for input(s): VITAMINB12, FOLATE, FERRITIN, TIBC, IRON, RETICCTPCT in the last 72 hours. Sepsis Labs: No results for input(s): PROCALCITON, LATICACIDVEN in the last 168 hours.  Recent Results (from the past 240 hour(s))  Culture, blood (routine x 2) Call MD if unable to obtain prior to antibiotics being given      Status: None (Preliminary result)   Collection Time: 04/17/2018  8:28 PM  Result Value Ref Range Status   Specimen Description   Final    BLOOD LEFT Performed at [facility], [place] [address], [place], [place] [address]    Special Requests   Final    BOTTLES DRAWN AEROBIC ONLY Blood Culture adequate volume Performed at [facility] [address] [place], [place] [address]    Culture   Final    NO GROWTH < 24 HOURS Performed at [facility] [address], [place], [place] [address]    Report Status PENDING  Incomplete  Culture, blood (routine x 2) Call MD if unable to obtain prior to antibiotics being given     Status: None (Preliminary result)   Collection Time: 04/20/2018  8:29 PM  Result Value Ref Range Status   Specimen Description   Final    BLOOD LEFT Performed at [facility] [address], [place], [place] [address]    Special Requests   Final    BOTTLES DRAWN AEROBIC ONLY Blood Culture adequate volume Performed at [facility] [address], [place], [place] [address]    Culture   Final    NO GROWTH < 24 HOURS Performed at [facility] [address], [place], [place] [address]    Report Status PENDING  Incomplete    Radiology Studies: Dg Chest 2 View  Result Date: 03/28/2018 CLINICAL DATA:  Shortness of breath EXAM: CHEST - 2 VIEW COMPARISON:  03/19/2018 FINDINGS: Porta catheter on the right with tip at the SVC. There is extensive bilateral interstitial and airspace opacity with progressed density in the right mid lung since prior. No effusion or pneumothorax. Normal heart size and mediastinal contours.  Remote rib fractures. IMPRESSION: 1. Increased opacity in the right mid lung, presumably pneumonia. 2. Background of severe emphysema. Electronically Signed   By: Monte Fantasia M.D.   On: 04/12/2018 15:52   Ct Angio Chest Pe W/cm &/or Wo Cm  Result Date:  04/22/2018 CLINICAL DATA:  72 y/o M; shortness of breath and dyspnea. Diagnose 2 weeks ago for pneumonia. Metastatic cancer. EXAM: CT ANGIOGRAPHY CHEST WITH CONTRAST TECHNIQUE: Multidetector CT imaging of the chest was performed using the standard  protocol during bolus administration of intravenous contrast. Multiplanar CT image reconstructions and MIPs were obtained to evaluate the vascular anatomy. CONTRAST:  111mL ISOVUE-370 IOPAMIDOL (ISOVUE-370) INJECTION 76% COMPARISON:  03/14/2018 CT chest. FINDINGS: Cardiovascular: Satisfactory opacification of the pulmonary arteries. Respiratory motion artifact. No central, lobar, or proximal segmental pulmonary embolus. Downstream pulmonary arteries are poorly assessed due to motion artifact. Normal caliber thoracic aorta and main pulmonary artery. Mild calcific atherosclerosis of the aorta and moderate coronary artery calcific atherosclerosis. Normal heart size. No pericardial effusion. Mediastinum/Nodes: Stable extensive mediastinal and hilar adenopathy, for example a right lower paratracheal node measuring 21 mm short axis (series 4, image 39) and a left prevascular node measuring 12 mm (series 4, image 25). Patent central airways. Normal thoracic esophagus. Lungs/Pleura: Interval diffuse increase in interlobular septal thickening and small bilateral pleural effusions. Pulmonary metastasis are increased in size, for example a nodule in the left upper lobe lingula measuring 20 mm, previously 16 mm (series 6, image 90) and a nodule at the right lung base measuring 25 mm, previously 19 mm (series 6, image 111). Right perihilar consolidations are decreased in comparison with the prior study. Background of severe emphysema. Upper Abdomen: Cholelithiasis. Partially visualized renal cyst. Stable bilateral nonspecific adrenal nodules. Musculoskeletal: Diffuse bony metastatic disease. Extra osseous extension surrounding the left T7 facet, diffusely at T9/10, left anterior T11.  Mild T11 anterior compression deformity is stable. No acute osseous abnormality identified. Review of the MIP images confirms the above findings. IMPRESSION: 1. Respiratory motion artifact. No central, lobar, or proximal segmental pulmonary embolus. Downstream pulmonary arteries are poorly assessed due to motion artifact. 2. Interval diffuse increase in interlobular septal thickening and small bilateral pleural effusions compatible with pulmonary edema. 3. Mildly improved right perihilar consolidations which may represent postobstructive pneumonitis or pneumonia. 4. Mild increased size of pulmonary metastasis. Stable extensive mediastinal and hilar adenopathy. 5. Grossly stable diffuse bony metastatic disease. 6. Stable nonspecific adrenal nodules, likely metastasis. 7. Background of severe emphysema, aortic atherosclerosis, coronary artery calcifications. Electronically Signed   By: Kristine Garbe M.D.   On: 04/03/2018 23:18   Scheduled Meds: . acidophilus  1 capsule Oral Daily  . arformoterol  15 mcg Nebulization BID  . budesonide (PULMICORT) nebulizer solution  0.25 mg Nebulization BID  . chlorhexidine  15 mL Mouth/Throat BID  . dorzolamide-timolol  1 drop Left Eye BID  . famotidine  20 mg Oral BID  . feeding supplement (ENSURE ENLIVE)  237 mL Oral BID BM  . furosemide  20 mg Intravenous BID  . furosemide  40 mg Intravenous Once  . guaiFENesin  1,200 mg Oral BID  . heparin  5,000 Units Subcutaneous Q8H  . ipratropium-albuterol  3 mL Nebulization TID  . prednisoLONE acetate  1 drop Left Eye QID  . predniSONE  20 mg Oral Q breakfast   Continuous Infusions: . aztreonam 2 g (04/01/18 1540)  . vancomycin      LOS: 1 day   Kerney Elbe, DO [facility] PAGER is on [place]  If 7PM-7AM, please contact night-coverage [email or web address] Password [email or web address] 04/01/2018, 5:35 PM

## 2018-04-01 NOTE — Progress Notes (Signed)
Wrtier arrived to room with patient yelling "I can't breathe". O2 sats 74% on 8 l/min via [place] O2 post breathing treatment. Called rapid response and MD arrived to floor. Pt will be transferred to Step-down for Bi-Pap

## 2018-04-01 NOTE — Progress Notes (Signed)
   04/01/18 1444  MEWS Assessment  Is this an acute change? No  Lab Results WBC  Date/Time Value Ref Range Status  04/01/2018 04:31 AM 8.2 4.0 - 10.5 K/uL Final  04/01/2018 04:11 PM 10.7 (H) 4.0 - 10.5 K/uL Final  03/16/2018 04:12 AM 12.7 (H) 4.0 - 10.5 K/uL Final   NEUT%  Date/Time Value Ref Range Status  10/09/2015 12:04 PM 72.4 39.0 - 75.0 % Final  09/11/2015 11:45 AM 68.9 39.0 - 75.0 % Final  08/07/2015 12:04 PM 69.3 39.0 - 75.0 % Final   Neutrophils Relative %  Date/Time Value Ref Range Status  04/18/2018 04:11 PM 92 % Final  03/14/2018 02:15 PM 86 % Final  01/04/2018 08:32 AM 80 % Final   pCO2 arterial  Date/Time Value Ref Range Status  03/14/2018 01:00 PM 28.7 (L) 32.0 - 48.0 mmHg Final   No results found for: LATICACIDVEN No results found for: PCO2VEN

## 2018-04-02 ENCOUNTER — Inpatient Hospital Stay (HOSPITAL_COMMUNITY): Payer: Medicare Other

## 2018-04-02 DIAGNOSIS — R0602 Shortness of breath: Secondary | ICD-10-CM

## 2018-04-02 DIAGNOSIS — I5030 Unspecified diastolic (congestive) heart failure: Secondary | ICD-10-CM

## 2018-04-02 DIAGNOSIS — Z515 Encounter for palliative care: Secondary | ICD-10-CM

## 2018-04-02 LAB — COMPREHENSIVE METABOLIC PANEL
ALK PHOS: 36 U/L — AB (ref 38–126)
ALT: 20 U/L (ref 0–44)
ANION GAP: 10 (ref 5–15)
AST: 22 U/L (ref 15–41)
Albumin: 2.2 g/dL — ABNORMAL LOW (ref 3.5–5.0)
BUN: 31 mg/dL — ABNORMAL HIGH (ref 8–23)
CO2: 25 mmol/L (ref 22–32)
CREATININE: 0.93 mg/dL (ref 0.61–1.24)
Calcium: 8.3 mg/dL — ABNORMAL LOW (ref 8.9–10.3)
Chloride: 100 mmol/L (ref 98–111)
GFR calc non Af Amer: >60 mL/min (ref >60)
Glucose, Bld: 127 mg/dL — ABNORMAL HIGH (ref 70–99)
Potassium: 4.6 mmol/L (ref 3.5–5.1)
Sodium: 135 mmol/L (ref 135–145)
TOTAL PROTEIN: 5.7 g/dL — AB (ref 6.5–8.1)
Total Bilirubin: 0.3 mg/dL (ref 0.3–1.2)

## 2018-04-02 LAB — GLUCOSE, CAPILLARY
GLUCOSE-CAPILLARY: 124 mg/dL — AB (ref 70–99)
Glucose-Capillary: 131 mg/dL — ABNORMAL HIGH (ref 70–99)
Glucose-Capillary: 96 mg/dL (ref 70–99)
Glucose-Capillary: 120 mg/dL — ABNORMAL HIGH (ref 70–99)
Glucose-Capillary: 122 mg/dL — ABNORMAL HIGH (ref 70–99)

## 2018-04-02 LAB — CBC WITH DIFFERENTIAL/PLATELET
ABS IMMATURE GRANULOCYTES: 0.16 10*3/uL — AB (ref 0.00–0.07)
BASOS PCT: 0 %
Basophils Absolute: 0 10*3/uL (ref 0.0–0.1)
EOS PCT: 0 %
Eosinophils Absolute: 0 10*3/uL (ref 0.0–0.5)
HCT: 28.2 % — ABNORMAL LOW (ref 39.0–52.0)
Hemoglobin: 8.8 g/dL — ABNORMAL LOW (ref 13.0–17.0)
Immature Granulocytes: 2 %
Lymphocytes Relative: 5 %
Lymphs Abs: 0.5 10*3/uL — ABNORMAL LOW (ref 0.7–4.0)
MCH: 33.6 pg (ref 26.0–34.0)
MCHC: 31.2 g/dL (ref 30.0–36.0)
MCV: 107.6 fL — ABNORMAL HIGH (ref 80.0–100.0)
MONO ABS: 0.5 10*3/uL (ref 0.1–1.0)
MONOS PCT: 4 %
NEUTROS ABS: 9.8 10*3/uL — AB (ref 1.7–7.7)
Neutrophils Relative %: 89 %
PLATELETS: 224 10*3/uL (ref 150–400)
RBC: 2.62 MIL/uL — ABNORMAL LOW (ref 4.22–5.81)
RDW: 15.2 % (ref 11.5–15.5)
WBC: 10.9 10*3/uL — AB (ref 4.0–10.5)
nRBC: 0 % (ref 0.0–0.2)

## 2018-04-02 LAB — ECHOCARDIOGRAM COMPLETE
HEIGHTINCHES: 69 in
WEIGHTICAEL: 1858.92 [oz_av]

## 2018-04-02 LAB — TROPONIN I: Troponin I: <0.03 ng/mL (ref <0.03)

## 2018-04-02 LAB — PROCALCITONIN

## 2018-04-02 LAB — MAGNESIUM: Magnesium: 2.3 mg/dL (ref 1.7–2.4)

## 2018-04-02 LAB — PHOSPHORUS: Phosphorus: 3.4 mg/dL (ref 2.5–4.6)

## 2018-04-02 MED ORDER — MORPHINE SULFATE (PF) 2 MG/ML IV SOLN
INTRAVENOUS | Status: AC
  Filled 2018-04-02: qty 1

## 2018-04-02 MED ORDER — CHLORHEXIDINE GLUCONATE CLOTH 2 % EX PADS
6.0000 | MEDICATED_PAD | Freq: Every day | CUTANEOUS | Status: DC
  Administered 2018-04-02 – 2018-04-04 (×3): 6 via TOPICAL

## 2018-04-02 MED ORDER — MORPHINE SULFATE (PF) 2 MG/ML IV SOLN
1.0000 mg | INTRAVENOUS | Status: DC | PRN
  Administered 2018-04-02: 1 mg via INTRAVENOUS
  Administered 2018-04-02 – 2018-04-07 (×28): 2 mg via INTRAVENOUS
  Filled 2018-04-02 (×28): qty 1

## 2018-04-02 MED ORDER — LIP MEDEX EX OINT
TOPICAL_OINTMENT | CUTANEOUS | Status: DC | PRN
  Filled 2018-04-02 (×2): qty 7

## 2018-04-02 NOTE — Progress Notes (Signed)
PROGRESS NOTE    Alexander Ellis  WGN:[id number] DOB: [birth year] DOA: 03/24/2018 PCP: [name], NP   Brief Narrative:  HPI per Dr. Jossie Ng on 04/18/2018 Alexander Ellis is a 72 y.o. male with medical history significant of COPD, prostate cancer with bone metastasis, lung cancer, hepatitis B, GERD, abdominal aortic aneurysm, who was in the hospital until 2 weeks ago when he was discharged home.  He was admitted then with COPD exacerbation.  He is on 6 to 8 L of oxygen at home.  He had pneumonia also during that hospitalization.  Patient completed IV antibiotics and steroids.  He continues to take prednisone up until today.  In the last few days he has continues to have significant shortness of breath and wheezing.  Also cough which is productive of yellow sputum.  He has had significant exertional dyspnea and even while staying stable in bed.  No fever or chills no nausea vomiting no diarrhea.  Patient has had chest discomfort with the cough.  He has tried his home breathing treatment with no help.  In the ER he was noted to have oxygen sats of 80% on 8 L initially.  Patient given breathing treatments and is being admitted for pneumonia that was found in his right mid lobe.  ED Course: Temperature 96.8 blood pressure 140/68 pulse 79 respirate 25 oxygen sat 98% room air.  White count is 10.7 hemoglobin 9.5 and platelet 226.  Sodium 138 potassium 4.5 chloride 103 CO2 26 BUN 21 creatinine 1.06 and calcium 8.3.  Glucose 140.  Chest x-ray showed increased opacity in the right midlung presumably pneumonia with background of severe emphysema.  CT angiogram of the chest showed no evidence of PE mainly diffuse increase in findings of pneumonia and some pulmonary edema  **Patient decompensated yesterday afternoon and became very hypoxic and short of breath on his home regimen of 8 L.  Desaturated on high 60s and was transferred to the stepdown unit for further evaluation and monitoring placed on heated high flow  nasal cannula.  PCCM physician Dr. Rodman Pickle was consulted for further evaluation recommendations given his worsening hypoxia.  Overnight the patient decompensated again and had to be placed on BiPAP this morning.  Palliative care medicine was called for further evaluation recommendations given his worsening condition and high risk for further decompensation.  Dr. Loanne Drilling had a conversation with the patient and the patient's family and after lengthy discussion his CODE STATUS was changed to DNR.  Assessment & Plan:   Principal Problem:   Pneumonia Active Problems:   AAA (abdominal aortic aneurysm) (HCC)   Chronic hepatitis B (HCC)   Prostate cancer metastatic to bone (HCC)   DOE (dyspnea on exertion)  Multifactorial acute on chronic respiratory failure with hypoxia in the setting of severe emphysematous COPD along with metastatic prostate cancer with mets to the lungs and volume overload from suspected CHF and HCAP pneumonia; now requiring noninvasive positive pressure ventilation with BiPAP -Patient normally wears 6-8 L at home and became hypoxic on 8 L today and had to be placed on a nonrebreather and transferred to the stepdown unit for further monitoring and evaluation; was on heated high flow nasal cannula and now had to be placed on BiPAP and was on 60% FiO2 this morning -PCCM/pulmonary consulted for further recommendations -CTA of the chest showed no PE (central/lobar/proximal segmental) PE but did show interval diffuse increase in intralobular septal thickening and small bilateral pleural effusion compatible with pulmonary edema.  There  is also mildly improved right perihilar consolidations which may be related to a post obstructive pneumonitis or pneumonia.  Patient did have increase in size of pulmonary metastasis and extensive mediastinal and hilar adenopathy along with grossly stable bony diffuse metastatic disease -Discussed with the patient about CODE STATUS and he wants to be a  partial code and wants everything except intubation yesterday however this morning goals of care discussion with Dr. Loanne Drilling revealed that his CODE STATUS will be changed to DNR as he does not want chest compressions now -Continue with duo nebs 3 mL 3 times daily along with 3 mL's every 4 PRN for wheezing or shortness breath -Continue with budesonide 0.25 mg nebulized twice daily -Continue with guaifenesin 1200 mg p.o. twice daily-given Solu-Cortef 200 mg IV once yesterday and I discussed with PCCM and they will be changing the patient to Methylprednisolone 40 mg Daily  -Check echocardiogram and this was currently done and showed Normal and vigorus Systolic Fxn with EF of 10-17% and Grade 1 Diastolic CHF was doppler parameters were consistent with abnormal LV Relaxation  -Diuresis as able.  Blood pressures were low so patient was started on IV twice daily 20 mg however blood pressure improved but diuresis will be held per recommendations of pulmonary currently -Patient takes 20 mg of prednisone daily breakfast however this was changed to IV Solu-Medrol 40 mg daily by pulmonary -Broad-spectrum antibiotics with Azactam due to penicillin allergy and vancomycin and will continue -Continue monitor respiratory status very carefully and maintain O2 saturations greater than 88% -Repeat chest x-ray in the a.m. -Swallow screen -Procalcitonin level was less than 0.10 back in 03/16/2018  And repeat again was <0.10 -Outpatient pulmonary notes revealed that they were in the process of increasing his oxygen requirements with a home concentrator with 10 L -Recently saw pulmonary 03/28/2018 hospital to be brought back within a week to see Dr. 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 Marin Olp via Epic -Patient's oncologist recommended comfort care and he is no longer candidate for systemic chemotherapy as his ECOG score is not good enough and Dr. Marin Olp felt like he had less than 20% chance of helping with systemic therapy -Continue pain control with hydrocodone-acetaminophen seven-point 5-325 mg 1 to 2 tablets every 4 hours PRN for moderate pain pain with ondansetron 8 mg p.o. twice daily for refractory nausea or vomiting along with prochlorperazine 10 mg p.o. every 6 hours as needed for nausea vomiting -Palliative Care Consulted for [facility] Discussion -Patient is now DNR  Neovascular Glaucoma -Continue with dorzolamide-timolol 1 drop in the left eye twice daily and prednisolone acetate 1 drop to left eye 4 times daily  GERD -Continue with Famotidine 20 mg p.o. twice daily  Pulmonary Edema  2/2 to Acute Grade 1 Diastolic CHF -BNP was not very elevated however chest CT scan showed pulmonary edema -Diuresis as able but now held due to Pulmonary Recc's -Strict I's and O's and daily weights -Stopped IV fluid hydration  -Patient is -546 mL -Wet is down 3 lbs since admission -Continue to Monitor Volume status carefully  Long-term tobacco user -Recently quit in July -Congratulated the patient on stopping smoking  Chronic hepatitis B virus infection -Table and at baseline  Liver cirrhosis -May need abdominal ultrasound -LFTs are normal -Did not appear to have ascites on clinical examination  Leukocytosis -WBC went from 10.7 -> 8.2 -> 10.9 -? In the setting of IV Steroid Demargination -Continue monitor for signs and symptoms of infection -Continue with antibiotic coverage -Repeat CBC in AM   Macrocytic Anemia -Hemoglobin/hematocrit went from 9.5/31.2 and is now 8.8/26.2 -Check Anemia Panel in a.m. -Daily monitor for  signs and symptoms of bleeding -Repeat CBC in a.m.  DVT prophylaxis: Heparin 5,000 units sq q8h Code Status: DO NOT RESUSCITATE Family Communication: Discussed with Wife and Family at bedside Disposition Plan: Pending further [place] Conversations  Consultants:   PCCM/Pulmonary  Palliative Care Medicine    Procedures:  ECHOCARDIOGRAM ------------------------------------------------------------------- Study Conclusions  - Left ventricle: The cavity size was normal. Wall thickness was   increased in a pattern of mild LVH. Systolic function was   vigorous. The estimated ejection fraction was in the range of 65%   to 70%. Wall motion was normal; there were no regional wall   motion abnormalities. Doppler parameters are consistent with   abnormal left ventricular relaxation (grade 1 diastolic   dysfunction). - Aortic valve: Moderately to severely calcified annulus.   Trileaflet; mildly thickened leaflets. - Mitral valve: Mildly thickened leaflets . - Left atrium: The atrium was mildly dilated. - Atrial septum: No defect or patent foramen ovale was identified. - Tricuspid valve: There was mild regurgitation.   Antimicrobials:  Anti-infectives (From admission, onward)   Start     Dose/Rate Route Frequency Ordered Stop   04/01/18 2200  vancomycin (VANCOCIN) IVPB 750 mg/150 ml premix     750 mg 150 mL/hr over 60 Minutes Intravenous Every 24 hours 04/22/2018 2116     04/01/18 0000  aztreonam (AZACTAM) 2 g in sodium chloride 0.9 % 100 mL IVPB     2 g 200 mL/hr over 30 Minutes Intravenous Every 8 hours 04/06/2018 2011 04/08/18 2359   04/22/2018 1745  vancomycin (VANCOCIN) IVPB 1000 mg/200 mL premix     1,000 mg 200 mL/hr over 60 Minutes Intravenous  Once 04/15/2018 1734 04/01/2018 2221   04/06/2018 1730  aztreonam (AZACTAM) 2 g in sodium chloride 0.9 % 100 mL IVPB     2 g 200 mL/hr over 30 Minutes Intravenous  Once 04/14/2018 1725 04/16/2018 1823     Subjective: Seen and examined this a.m. and  he had to be placed on BiPAP.  Denies any lightheadedness or dizziness but did admit to having significant shortness of breath.  States he did not feel very well.  Denies any chest pain.  No other concerns or complaints at this time but states that his breathing has worsened significantly this morning  Objective: Vitals:   04/02/18 0817 04/02/18 0900 04/02/18 1000 04/02/18 1100  BP:  121/66 (!) 113/59 100/61  Pulse:  79 74 74  Resp:  (!) 22 (!) 24 (!) 22  Temp: (!) 97.4 F (36.3 C)     TempSrc: Axillary     SpO2:  95% 95% 97%  Weight:      Height:        Intake/Output Summary (Last 24 hours) at 04/02/2018 1216 Last data filed at 04/02/2018 1100 Gross per 24 hour  Intake 602.72 ml  Output 1900 ml  Net -1297.28 ml   Filed Weights   03/29/2018 1433 04/01/18 1727 04/02/18 0500  Weight: 54.4 kg 55.1 kg 52.7 kg   Examination: Physical  Exam:  Constitutional: Thin, frail, cachectic and tonically ill-appearing Caucasian male who is in respiratory distress and now on BiPAP and appears uncomfortable. Eyes: Lids and conjunctive are normal.  Sclera anicteric ENMT: External ears and nose appear normal.  Has been extremely poor dentition Neck: Appears supple no JVD Respiratory: Severely diminished bilaterally with coarse breath sounds and diffuse wheezing and rhonchi noted.  He has increased respiratory effort and is using some accessory muscles to breathe.  He is now on BiPAP Cardiovascular: Regular rate and rhythm.  No lower extremity edema noted Abdomen: Soft, nontender, nondistended.  Bowel sounds present in 4 quadrants GU: Deferred Musculoskeletal: No contractures or cyanosis.  Skin: Is warm and dry no appreciable rashes or lesions limited skin evaluation Neurologic: Cranial nerves II through XII grossly intact no appreciable focal deficits. Psychiatric: Appeared anxious but appropriate affect.  Normal judgment and insight.  Patient is awake, alert and oriented  Data Reviewed: I have  personally reviewed following labs and imaging studies  CBC: Recent Labs  Lab 04/22/2018 1611 04/01/18 0431 04/02/18 0510  WBC 10.7* 8.2 10.9*  NEUTROABS 9.9*  --  9.8*  HGB 9.5* 8.2* 8.8*  HCT 31.2* 26.3* 28.2*  MCV 109.5* 106.0* 107.6*  PLT 226 205 878   Basic Metabolic Panel: Recent Labs  Lab 04/09/2018 1611 04/01/18 0431 04/02/18 0510  NA 138 133* 135  K 4.5 4.4 4.6  CL 103 102 100  CO2 26 25 25   GLUCOSE 140* 126* 127*  BUN 21 21 31*  CREATININE 1.06 0.89 0.93  CALCIUM 8.3* 7.6* 8.3*  MG  --   --  2.3  PHOS  --   --  3.4   GFR: Estimated Creatinine Clearance: 51.9 mL/min (by C-G formula based on SCr of 0.93 mg/dL). Liver Function Tests: Recent Labs  Lab 04/17/2018 1611 04/01/18 0431 04/02/18 0510  AST 23 15 22   ALT 18 14 20   ALKPHOS 38 31* 36*  BILITOT 0.7 0.3 0.3  PROT 6.1* 5.3* 5.7*  ALBUMIN 2.5* 2.0* 2.2*   No results for input(s): LIPASE, AMYLASE in the last 168 hours. No results for input(s): AMMONIA in the last 168 hours. Coagulation Profile: No results for input(s): INR, PROTIME in the last 168 hours. Cardiac Enzymes: Recent Labs  Lab 04/11/2018 1611 04/01/18 1834 04/02/18 0057 04/02/18 0510  TROPONINI <0.03 <0.03 <0.03 <0.03   BNP (last 3 results) No results for input(s): PROBNP in the last 8760 hours. HbA1C: No results for input(s): HGBA1C in the last 72 hours. CBG: Recent Labs  Lab 04/01/18 1832 04/02/18 0756  GLUCAP 164* 131*   Lipid Profile: No results for input(s): CHOL, HDL, LDLCALC, TRIG, CHOLHDL, LDLDIRECT in the last 72 hours. Thyroid Function Tests: No results for input(s): TSH, T4TOTAL, FREET4, T3FREE, THYROIDAB in the last 72 hours. Anemia Panel: No results for input(s): VITAMINB12, FOLATE, FERRITIN, TIBC, IRON, RETICCTPCT in the last 72 hours. Sepsis Labs: Recent Labs  Lab 04/01/18 1834  PROCALCITON <0.10    Recent Results (from the past 240 hour(s))  Culture, blood (routine x 2) Call MD if unable to obtain prior to  antibiotics being given     Status: None (Preliminary result)   Collection Time: 04/22/2018  8:28 PM  Result Value Ref Range Status   Specimen Description   Final    BLOOD LEFT Performed at [facility], [place] [address] [place], [place] [address]    Special Requests   Final    BOTTLES DRAWN AEROBIC ONLY Blood Culture adequate  volume Performed at [facility], [place] [address], [place], [place] [address]    Culture   Final    NO GROWTH 2 DAYS Performed at [facility] [address], [place], [place] [address]    Report Status PENDING  Incomplete  Culture, blood (routine x 2) Call MD if unable to obtain prior to antibiotics being given     Status: None (Preliminary result)   Collection Time: 03/29/2018  8:29 PM  Result Value Ref Range Status   Specimen Description   Final    BLOOD LEFT Performed at [facility] [address], [place], [place] [address]    Special Requests   Final    BOTTLES DRAWN AEROBIC ONLY Blood Culture adequate volume Performed at [facility] [address], [place], [place] [address]    Culture   Final    NO GROWTH 2 DAYS Performed at [facility] [address] [place], [place] [address]    Report Status PENDING  Incomplete  Expectorated sputum assessment w rflx to resp cult     Status: None   Collection Time: 04/01/18  5:57 PM  Result Value Ref Range Status   Specimen Description SPUTUM  Final   Special Requests NONE  Final   Sputum evaluation   Final    Sputum specimen not acceptable for testing.  Please recollect.   NOTIFIED ARNOLD,A RN @1909  ON 04/01/18 JACKSON,K Performed at [facility], [place] [address] [place], [place] [address]    Report Status 04/01/2018 FINAL  Final  MRSA PCR Screening     Status: None   Collection Time: 04/01/18  6:40 PM  Result Value Ref Range Status   MRSA by PCR NEGATIVE NEGATIVE Final    Comment:        The  GeneXpert MRSA Assay (FDA approved for NASAL specimens only), is one component of a comprehensive MRSA colonization surveillance program. It is not intended to diagnose MRSA infection nor to guide or monitor treatment for MRSA infections. Performed at [facility], [place] [address] [place], [place] [address]     Radiology Studies: Dg Chest 2 View  Result Date: 04/20/2018 CLINICAL DATA:  Shortness of breath EXAM: CHEST - 2 VIEW COMPARISON:  03/19/2018 FINDINGS: Porta catheter on the right with tip at the SVC. There is extensive bilateral interstitial and airspace opacity with progressed density in the right mid lung since prior. No effusion or pneumothorax. Normal heart size and mediastinal contours.  Remote rib fractures. IMPRESSION: 1. Increased opacity in the right mid lung, presumably pneumonia. 2. Background of severe emphysema. Electronically Signed   By: Monte Fantasia M.D.   On: 04/06/2018 15:52   Ct Angio Chest Pe W/cm &/or Wo Cm  Result Date: 04/14/2018 CLINICAL DATA:  72 y/o M; shortness of breath and dyspnea. Diagnose 2 weeks ago for pneumonia. Metastatic cancer. EXAM: CT ANGIOGRAPHY CHEST WITH CONTRAST TECHNIQUE: Multidetector CT imaging of the chest was performed using the standard protocol during bolus administration of intravenous contrast. Multiplanar CT image reconstructions and MIPs were obtained to evaluate the vascular anatomy. CONTRAST:  160mL ISOVUE-370 IOPAMIDOL (ISOVUE-370) INJECTION 76% COMPARISON:  03/14/2018 CT chest. FINDINGS: Cardiovascular: Satisfactory opacification of the pulmonary arteries. Respiratory motion artifact. No central, lobar, or proximal segmental pulmonary embolus. Downstream pulmonary arteries are poorly assessed due to motion artifact. Normal caliber thoracic aorta and main pulmonary artery. Mild calcific atherosclerosis of the aorta and moderate coronary artery calcific  atherosclerosis. Normal heart size. No pericardial effusion.  Mediastinum/Nodes: Stable extensive mediastinal and hilar adenopathy, for example a right lower paratracheal node measuring 21 mm short axis (series 4, image 39) and a left prevascular node measuring 12 mm (series 4, image 25). Patent central airways. Normal thoracic esophagus. Lungs/Pleura: Interval diffuse increase in interlobular septal thickening and small bilateral pleural effusions. Pulmonary metastasis are increased in size, for example a nodule in the left upper lobe lingula measuring 20 mm, previously 16 mm (series 6, image 90) and a nodule at the right lung base measuring 25 mm, previously 19 mm (series 6, image 111). Right perihilar consolidations are decreased in comparison with the prior study. Background of severe emphysema. Upper Abdomen: Cholelithiasis. Partially visualized renal cyst. Stable bilateral nonspecific adrenal nodules. Musculoskeletal: Diffuse bony metastatic disease. Extra osseous extension surrounding the left T7 facet, diffusely at T9/10, left anterior T11. Mild T11 anterior compression deformity is stable. No acute osseous abnormality identified. Review of the MIP images confirms the above findings. IMPRESSION: 1. Respiratory motion artifact. No central, lobar, or proximal segmental pulmonary embolus. Downstream pulmonary arteries are poorly assessed due to motion artifact. 2. Interval diffuse increase in interlobular septal thickening and small bilateral pleural effusions compatible with pulmonary edema. 3. Mildly improved right perihilar consolidations which may represent postobstructive pneumonitis or pneumonia. 4. Mild increased size of pulmonary metastasis. Stable extensive mediastinal and hilar adenopathy. 5. Grossly stable diffuse bony metastatic disease. 6. Stable nonspecific adrenal nodules, likely metastasis. 7. Background of severe emphysema, aortic atherosclerosis, coronary artery calcifications. Electronically Signed   By: Kristine Garbe M.D.   On: 03/29/2018  23:18   Scheduled Meds: . acidophilus  1 capsule Oral Daily  . arformoterol  15 mcg Nebulization BID  . budesonide (PULMICORT) nebulizer solution  0.25 mg Nebulization BID  . chlorhexidine  15 mL Mouth/Throat BID  . Chlorhexidine Gluconate Cloth  6 each Topical Q0600  . dorzolamide-timolol  1 drop Left Eye BID  . famotidine  20 mg Oral BID  . feeding supplement (ENSURE ENLIVE)  237 mL Oral BID BM  . guaiFENesin  1,200 mg Oral BID  . heparin  5,000 Units Subcutaneous Q8H  . ipratropium-albuterol  3 mL Nebulization TID  . methylPREDNISolone (SOLU-MEDROL) injection  40 mg Intravenous Daily  . prednisoLONE acetate  1 drop Left Eye QID   Continuous Infusions: . aztreonam Stopped (04/02/18 0919)  . vancomycin Stopped (04/01/18 2318)    LOS: 2 days   Kerney Elbe, DO [facility] PAGER is on Toast  If 7PM-7AM, please contact night-coverage [email or web address] Password [email or web address] 04/02/2018, 12:16 PM

## 2018-04-02 NOTE — Progress Notes (Signed)
  Echocardiogram 2D Echocardiogram has been performed.  Merrie Roof F 04/02/2018, 10:34 AM

## 2018-04-02 NOTE — Progress Notes (Signed)
NAME:  Alexander Ellis, MRN:  [id number], DOB:  [birth year], LOS: 2 ADMISSION DATE:  03/30/2018, CONSULTATION DATE:  04/01/18 REFERRING MD:  Raiford Noble, DO CHIEF COMPLAINT:  Acute on chronic hypoxemic respiratory failure  Brief History   72 year old male with history of prostate cancer with bone and metastasis, COPD, acute on chronic hypoxemic respiratory failure on 6 to 8 L of O2, chronic prednisone therapy, abdominal aortic aneurysm, hepatitis B and GERD who was admitted for COPD exacerbation. Developed respiratory distress and hypoxemia requiring rebreather. Critical care consulted for acute hypoxemic respiratory failure.  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, emphysema, GERD, abdominal aortic aneurysm Significant Hospital Events   11/8 admitted COPD exacerbation secondary pneumonia 11/9 Transferred to ICU for acute hypoxemic respiratory failure 11/10 Started on BiPAP  Consults: date of consult/date signed off & final recs:  11/9 PCCM  Procedures (surgical and bedside):    Significant Diagnostic Tests:  CTA 11/8-no PE, intralobular septal thickening, small bilateral pleural effusions right perihilar airspace disease  Micro Data:  BCx 11/8>>>  Antimicrobials:  Vanc 11/8>>> Aztreonam 11/8>>>  Subjective:  Reports severe shortness of breath even with turning Objective   Blood pressure (!) 110/52, pulse 84, temperature 98.1 F (36.7 C), temperature source Oral, resp. rate (!) 43, height _0  (1.753 m), weight 52.7 kg, SpO2 92 %.    FiO2 (%):  [50 %-100 %] 100 %   Intake/Output Summary (Last 24 hours) at 04/02/2018 0805 Last data filed at 04/02/2018 0500 Gross per 24 hour  Intake 652.72 ml  Output 1550 ml  Net -897.28 ml   Filed Weights   04/05/2018 1433 04/01/18 1727 04/02/18 0500  Weight: 54.4 kg 55.1 kg 52.7 kg    Examination: General: Chronically ill-appearing male, in respiratory distress, on BiPAP HENT: [place], AT, EOMI, no scleral  icterus Lungs: Diminished breath sounds bilaterally, no wheezes, crackles or rhonchi  Cardiovascular: RRR,-M/G/R, no JVD Abdomen: BS+, NTTP Extremities: No edema, distal pulses present Neuro: AAO x4 CN II through XII grossly intact  Resolved Hospital Problem list   None Assessment & Plan:   Acute on chronic hypoxemic respiratory failure: Worsening Wears 6 to 8 L O2 at home.  Became hypoxemic on 8 L O2 on the floor and required nonrebreather.  Chest imaging mistreated airspace disease concerning for postobstructive pneumonia --Transition to BiPAP with O2 saturation goal 88 to 92% --Continue empiric antibiotics: Vancomycin and aztreonam (anaphylaxis to penicillins) --Discontinue continuous IVF --Hold further diuresis until TTE obtained  COPD exacerbation COPD, FEV1 79% Emphysema --Continue systemic steroids x5 days.  On chronic prednisone 20 mg daily at home --Complete antibiotic course as above --Bronchodilators --TTE  Metastatic prostate cancer Oncology has previously recommended comfort care as patient is no longer candidate for systemic chemotherapy.  He has previously met with palliative as an inpatient and declined palliative care services at home --Continue to address goals of care --Palliative care consult placed  Goals of Care I have discussed goals of care with patient.  Wife and daughter present.  We discussed his current critical condition in addition to his comorbidities including his metastatic prostate cancer for which she is no longer a candidate for chemotherapy.  Patient does not wish to have chest compressions or be placed on mechanical ventilation.  However he is not ready to withdraw care.  He would like to continue medical therapy for now.  CODE STATUS: DNR   Disposition / Summary of Today's Plan 04/02/18   Same as above  Diet: NPO Pain/Anxiety/Delirium protocol (if indicated): N/A VAP protocol (if indicated): N/A DVT prophylaxis: Subcu heparin GI  prophylaxis: Pepcid Hyperglycemia protocol: Glucose q4h Mobility: Bedrest Code Status: Patient wishes to have CPR however no intubation.  Primary team discussed with patient prior to transfer to ICU. Family Communication: Wife  Labs   CBC: Recent Labs  Lab 03/28/2018 1611 04/01/18 0431 04/02/18 0510  WBC 10.7* 8.2 10.9*  NEUTROABS 9.9*  --  9.8*  HGB 9.5* 8.2* 8.8*  HCT 31.2* 26.3* 28.2*  MCV 109.5* 106.0* 107.6*  PLT 226 205 588    Basic Metabolic Panel: Recent Labs  Lab 03/29/2018 1611 04/01/18 0431 04/02/18 0510  NA 138 133* 135  K 4.5 4.4 4.6  CL 103 102 100  CO2 _0 GLUCOSE 140* 126* 127*  BUN 21 21 31*  CREATININE 1.06 0.89 0.93  CALCIUM 8.3* 7.6* 8.3*  MG  --   --  2.3  PHOS  --   --  3.4   GFR: Estimated Creatinine Clearance: 51.9 mL/min (by C-G formula based on SCr of 0.93 mg/dL). Recent Labs  Lab 04/04/2018 1611 04/01/18 0431 04/01/18 1834 04/02/18 0510  PROCALCITON  --   --  <0.10  --   WBC 10.7* 8.2  --  10.9*    Liver Function Tests: Recent Labs  Lab 04/18/2018 1611 04/01/18 0431 04/02/18 0510  AST _1 ALT _2 ALKPHOS 38 31* 36*  BILITOT 0.7 0.3 0.3  PROT 6.1* 5.3* 5.7*  ALBUMIN 2.5* 2.0* 2.2*   No results for input(s): LIPASE, AMYLASE in the last 168 hours. No results for input(s): AMMONIA in the last 168 hours.  ABG    Component Value Date/Time   PHART 7.501 (H) 04/01/2018 1800   PCO2ART 33.8 04/01/2018 1800   PO2ART 43.9 (L) 04/01/2018 1800   HCO3 26.2 04/01/2018 1800   TCO2 22 07/19/2014 1317   ACIDBASEDEF 0.2 03/14/2018 1300   O2SAT 78.4 04/01/2018 1800     Coagulation Profile: No results for input(s): INR, PROTIME in the last 168 hours.  Cardiac Enzymes: Recent Labs  Lab 03/27/2018 1611 04/01/18 1834 04/02/18 0057 04/02/18 0510  TROPONINI <0.03 <0.03 <0.03 <0.03    HbA1C: No results found for: HGBA1C  CBG: Recent Labs  Lab 04/01/18 1832 04/02/18 0756  GLUCAP 164* 131*   The patient is  critically ill with multiple organ systems failure and requires high complexity decision making for assessment and support, frequent evaluation and titration of therapies, application of advanced monitoring technologies and extensive interpretation of multiple databases.   Critical Care Time devoted to patient care services described in this note is  60 Minutes. This time reflects time of care of this signee Dr. Rodman Pickle. This critical care time does not reflect procedure time, or teaching time or supervisory time of PA/NP/Med student/Med Resident etc but could involve care discussion time.  Rodman Pickle, M.D. [facility] Pulmonary/Critical Care Medicine. Pager: [phone]. After hours pager: [phone].

## 2018-04-03 ENCOUNTER — Inpatient Hospital Stay (HOSPITAL_COMMUNITY): Payer: Medicare Other

## 2018-04-03 DIAGNOSIS — J438 Other emphysema: Secondary | ICD-10-CM

## 2018-04-03 DIAGNOSIS — C61 Malignant neoplasm of prostate: Secondary | ICD-10-CM

## 2018-04-03 DIAGNOSIS — C7951 Secondary malignant neoplasm of bone: Secondary | ICD-10-CM

## 2018-04-03 DIAGNOSIS — C799 Secondary malignant neoplasm of unspecified site: Secondary | ICD-10-CM

## 2018-04-03 DIAGNOSIS — J181 Lobar pneumonia, unspecified organism: Secondary | ICD-10-CM

## 2018-04-03 DIAGNOSIS — J9621 Acute and chronic respiratory failure with hypoxia: Secondary | ICD-10-CM

## 2018-04-03 DIAGNOSIS — E43 Unspecified severe protein-calorie malnutrition: Secondary | ICD-10-CM | POA: Diagnosis present

## 2018-04-03 LAB — COMPREHENSIVE METABOLIC PANEL
ALT: 16 U/L (ref 0–44)
ANION GAP: 9 (ref 5–15)
AST: 17 U/L (ref 15–41)
Albumin: 2.2 g/dL — ABNORMAL LOW (ref 3.5–5.0)
Alkaline Phosphatase: 34 U/L — ABNORMAL LOW (ref 38–126)
BUN: 34 mg/dL — ABNORMAL HIGH (ref 8–23)
CHLORIDE: 105 mmol/L (ref 98–111)
CO2: 24 mmol/L (ref 22–32)
Calcium: 8.5 mg/dL — ABNORMAL LOW (ref 8.9–10.3)
Creatinine, Ser: 0.99 mg/dL (ref 0.61–1.24)
Glucose, Bld: 94 mg/dL (ref 70–99)
POTASSIUM: 4.7 mmol/L (ref 3.5–5.1)
Sodium: 138 mmol/L (ref 135–145)
TOTAL PROTEIN: 5.7 g/dL — AB (ref 6.5–8.1)
Total Bilirubin: 0.4 mg/dL (ref 0.3–1.2)

## 2018-04-03 LAB — CBC WITH DIFFERENTIAL/PLATELET
Abs Immature Granulocytes: 0.22 10*3/uL — ABNORMAL HIGH (ref 0.00–0.07)
BASOS PCT: 0 %
Basophils Absolute: 0 10*3/uL (ref 0.0–0.1)
EOS ABS: 0 10*3/uL (ref 0.0–0.5)
EOS PCT: 0 %
HCT: 28.7 % — ABNORMAL LOW (ref 39.0–52.0)
Hemoglobin: 9 g/dL — ABNORMAL LOW (ref 13.0–17.0)
IMMATURE GRANULOCYTES: 2 %
Lymphocytes Relative: 7 %
Lymphs Abs: 0.8 10*3/uL (ref 0.7–4.0)
MCH: 34.2 pg — AB (ref 26.0–34.0)
MCHC: 31.4 g/dL (ref 30.0–36.0)
MCV: 109.1 fL — ABNORMAL HIGH (ref 80.0–100.0)
Monocytes Absolute: 0.6 10*3/uL (ref 0.1–1.0)
Monocytes Relative: 5 %
NEUTROS PCT: 86 %
Neutro Abs: 9.7 10*3/uL — ABNORMAL HIGH (ref 1.7–7.7)
PLATELETS: 234 10*3/uL (ref 150–400)
RBC: 2.63 MIL/uL — ABNORMAL LOW (ref 4.22–5.81)
RDW: 15.2 % (ref 11.5–15.5)
WBC: 11.3 10*3/uL — ABNORMAL HIGH (ref 4.0–10.5)
nRBC: 0 % (ref 0.0–0.2)

## 2018-04-03 LAB — GLUCOSE, CAPILLARY
GLUCOSE-CAPILLARY: 112 mg/dL — AB (ref 70–99)
GLUCOSE-CAPILLARY: 156 mg/dL — AB (ref 70–99)
Glucose-Capillary: 117 mg/dL — ABNORMAL HIGH (ref 70–99)
Glucose-Capillary: 118 mg/dL — ABNORMAL HIGH (ref 70–99)
Glucose-Capillary: 151 mg/dL — ABNORMAL HIGH (ref 70–99)
Glucose-Capillary: 68 mg/dL — ABNORMAL LOW (ref 70–99)
Glucose-Capillary: 91 mg/dL (ref 70–99)

## 2018-04-03 LAB — IRON AND TIBC
Iron: 85 ug/dL (ref 45–182)
Saturation Ratios: 39 % (ref 17.9–39.5)
TIBC: 217 ug/dL — ABNORMAL LOW (ref 250–450)
UIBC: 132 ug/dL

## 2018-04-03 LAB — FOLATE: FOLATE: 5.7 ng/mL — AB (ref >5.9)

## 2018-04-03 LAB — RETICULOCYTES
IMMATURE RETIC FRACT: 29 % — AB (ref 2.3–15.9)
RBC.: 2.63 MIL/uL — AB (ref 4.22–5.81)
RETIC COUNT ABSOLUTE: 53.4 10*3/uL (ref 19.0–186.0)
Retic Ct Pct: 2 % (ref 0.4–3.1)

## 2018-04-03 LAB — PHOSPHORUS: PHOSPHORUS: 3.8 mg/dL (ref 2.5–4.6)

## 2018-04-03 LAB — VITAMIN B12: VITAMIN B 12: 400 pg/mL (ref 180–914)

## 2018-04-03 LAB — MAGNESIUM: MAGNESIUM: 2.4 mg/dL (ref 1.7–2.4)

## 2018-04-03 LAB — PROCALCITONIN

## 2018-04-03 LAB — FERRITIN: Ferritin: 1921 ng/mL — ABNORMAL HIGH (ref 24–336)

## 2018-04-03 MED ORDER — ORAL CARE MOUTH RINSE
15.0000 mL | Freq: Two times a day (BID) | OROMUCOSAL | Status: DC
  Administered 2018-04-04 – 2018-04-08 (×4): 15 mL via OROMUCOSAL

## 2018-04-03 MED ORDER — CHLORHEXIDINE GLUCONATE 0.12 % MT SOLN
15.0000 mL | Freq: Two times a day (BID) | OROMUCOSAL | Status: DC
  Administered 2018-04-04 – 2018-04-09 (×8): 15 mL via OROMUCOSAL
  Filled 2018-04-03 (×4): qty 15

## 2018-04-03 MED ORDER — LORAZEPAM 0.5 MG PO TABS
0.5000 mg | ORAL_TABLET | Freq: Four times a day (QID) | ORAL | Status: DC | PRN
  Administered 2018-04-03 – 2018-04-06 (×8): 0.5 mg via ORAL
  Filled 2018-04-03 (×8): qty 1

## 2018-04-03 MED ORDER — DEXTROSE 50 % IV SOLN
25.0000 mL | Freq: Once | INTRAVENOUS | Status: AC
  Administered 2018-04-03: 25 mL via INTRAVENOUS
  Filled 2018-04-03: qty 50

## 2018-04-03 MED ORDER — ADULT MULTIVITAMIN W/MINERALS CH
1.0000 | ORAL_TABLET | Freq: Every day | ORAL | Status: DC
  Administered 2018-04-03 – 2018-04-06 (×4): 1 via ORAL
  Filled 2018-04-03 (×4): qty 1

## 2018-04-03 MED ORDER — ENSURE ENLIVE PO LIQD
237.0000 mL | Freq: Three times a day (TID) | ORAL | Status: DC
  Administered 2018-04-03 – 2018-04-09 (×12): 237 mL via ORAL

## 2018-04-03 MED ORDER — LORAZEPAM 0.5 MG PO TABS: 0.2500 mg | ORAL_TABLET | Freq: Two times a day (BID) | ORAL | Status: DC | PRN

## 2018-04-03 NOTE — Progress Notes (Signed)
Pharmacy Antibiotic Note  Alexander Ellis is a 72 y.o. male admitted on 04/16/2018 with pneumonia.  Pharmacy has been consulted for Vancomycin dosing, renal dosing of Aztreonam. PMH significant for COPD on home O2, ILD, pulmonary infiltrates/nodules, prostate cancer with progressive, widespread mets, recent hospital admission for pneumonia treated with Levaquin 10/22-10/23.  Noted PCN allergy (anaphylaxis, hives, rash) - no documented cephalosporin or PCN use in [facility].  Day #3 broad spectrum antibiotics. SCr 0.99, CrCl ~ 50 ml/min WBC 11.3 (steroids)  Plan:  Aztreonam 2g IV q8h  Vancomycin 1g IV x1 in ED, then 750 mg IV q24h.  Check vancomycin levels if remains on vancomycin > 3-4 days.  Goal AUC 400-500.  Follow up palliative/GOC discussion prior to ordering levels.  Follow up renal fxn, culture results, and clinical course.  F/u ability to de-escalate antibiotics.   Height: 5\' 9"  (175.3 cm) Weight: 120 lb 2.4 oz (54.5 kg) IBW/kg (Calculated) : 70.7  Temp (24hrs), Avg:97.5 F (36.4 C), Min:97.3 F (36.3 C), Max:97.8 F (36.6 C)  Recent Labs  Lab 04/03/2018 1611 04/01/18 0431 04/02/18 0510 04/03/18 0353  WBC 10.7* 8.2 10.9* 11.3*  CREATININE 1.06 0.89 0.93 0.99    Estimated Creatinine Clearance: 50.5 mL/min (by C-G formula based on SCr of 0.99 mg/dL).    Allergies  Allergen Reactions  . Oxycodone Other (See Comments)    somnolence Sleep all the time  . Penicillins Anaphylaxis, Hives and Rash    Has patient had a PCN reaction causing immediate rash, facial/tongue/throat swelling, SOB or lightheadedness with hypotension: Yes Has patient had a PCN reaction causing severe rash involving mucus membranes or skin necrosis: No Has patient had a PCN reaction that required hospitalization: No Has patient had a PCN reaction occurring within the last 10 years: Yes If all of the above answers are "NO", then may proceed with Cephalosporin use.   . Codeine Nausea And Vomiting  .  Other Nausea Only and Other (See Comments)    Uncoded Allergy. Allergen: IV contrast  . Hydrocodone-Acetaminophen Nausea And Vomiting  . Iohexol Nausea And Vomiting    Antimicrobials this admission: 11/8 Vancomycin >> 11/8 Aztreonam >>   Dose adjustments this admission:   Microbiology results: 11/8 BCx: ngtd 11/9 Sputum:  Recollect needed 11/9 MRSA PCR: negative  Thank you for allowing pharmacy to be a part of this patient's care.  Gretta Arab PharmD, BCPS Pager [phone] 04/03/2018 9:02 AM

## 2018-04-03 NOTE — Progress Notes (Addendum)
NAME:  Alexander Ellis, MRN:  [id number], DOB:  26-Jul-1945, LOS: 3 ADMISSION DATE:  04/12/2018, CONSULTATION DATE:  04/01/18 REFERRING MD:  Raiford Noble, DO CHIEF COMPLAINT:  Acute on chronic hypoxemic respiratory failure  Brief History   73 year old male with history of prostate cancer with bone and metastasis, COPD, acute on chronic hypoxemic respiratory failure on 6 to 8 L of O2, chronic prednisone therapy, abdominal aortic aneurysm, hepatitis B and GERD who was admitted for COPD exacerbation. Developed respiratory distress and hypoxemia requiring rebreather. Critical care consulted for acute hypoxemic respiratory failure.  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, emphysema, GERD, abdominal aortic aneurysm  Significant Hospital Events   11/8 admitted COPD exacerbation secondary pneumonia 11/9 Transferred to ICU for acute hypoxemic respiratory failure 11/10 Started on BiPAP 11/11 on high flow oxygen, saturations 88-90  Consults: date of consult/date signed off & final recs:  11/9 PCCM  Procedures (surgical and bedside):    Significant Diagnostic Tests:  CTA 11/8-no PE, intralobular septal thickening, small bilateral pleural effusions right perihilar airspace disease  Micro Data:  BCx 11/8>>>  Antimicrobials:  Vanc 11/8>>> Aztreonam 11/8>>>  Subjective:  Reports severe shortness of breath even with turning He does desaturate significantly with any activity Complaining of a lot of anxiety although feels comfortable at the present time  Objective   Blood pressure (!) 86/53, pulse 77, temperature (!) 97.5 F (36.4 C), temperature source Axillary, resp. rate (!) 26, height 5\' 9"  (1.753 m), weight 54.5 kg, SpO2 90 %.    FiO2 (%):  [50 %-80 %] 80 %   Intake/Output Summary (Last 24 hours) at 04/03/2018 1055 Last data filed at 04/03/2018 2836 Gross per 24 hour  Intake 477.42 ml  Output 1425 ml  Net -947.58 ml   Filed Weights   04/01/18 1727  04/02/18 0500 04/03/18 0354  Weight: 55.1 kg 52.7 kg 54.5 kg    Examination: General: Chronically ill-appearing male, does not appear to be in distress HENT: Dry oral mucosa Lungs: Diminished breath sounds bilaterally, he does have occasional rhonchi Cardiovascular: S1-S2 appreciated with no murmur Abdomen: BS+, NTTP Extremities: Significant clubbing Neuro: Alert and oriented x3  Resolved Hospital Problem list   None  Assessment & Plan:   Acute on chronic hypoxemic respiratory failure: Worsening Wears 6 to 8 L O2 at home.  Became hypoxemic on 8 L O2 on the floor and required nonrebreather.  Possible pneumonia --Was on BiPAP with O2 saturation goal 88 to 92%, transition to high flow oxygen and he is tolerating this well --Continue empiric antibiotics: Vancomycin and aztreonam (anaphylaxis to penicillins) --Hold further diuresis  --We will continue antibiotics-5 days total, patient has very very poor reserves, cultures are not positive at present, secondary to his acute decompensation, consideration has to be given for this being an infectious exacerbation -Will stop antibiotics 04/04/2018-cultures have been negative, not able to produce an adequate sputum sample  COPD exacerbation COPD, FEV1 79%, diffusing capacity in the teens-16% Emphysema --Continue systemic steroids x5 days.  On chronic prednisone 20 mg daily at home --Complete antibiotic course as above --Bronchodilators  Metastatic prostate cancer Oncology has previously recommended comfort care as patient is no longer candidate for systemic chemotherapy.   --Continue to address goals of care --Palliative care consult placed-was seen by palliative care on 04/02/2018 --Did speak with Dr. Domingo Cocking today  Goals of Care I have discussed goals of care with patient's spouse and daughter, other family members present at bedside  No  longer a candidate for chemotherapy, very poor reserves at baseline even prior to recent  decompensation. Patient does not wish to have chest compressions or be placed on mechanical ventilation. However, he is not ready to withdraw care.  He would like to continue medical therapy for now.  CODE STATUS: DNR   Disposition / Summary of Today's Plan 04/03/18   Same as above Continue antibiotics Continue steroids    Diet: Will advance his diet as tolerated DVT prophylaxis: Subcu heparin GI prophylaxis: Pepcid Hyperglycemia protocol: Glucose q4h Mobility: Bedrest Code Status: Patient wishes to have CPR however no intubation.  Primary team discussed with patient prior to transfer to ICU. Family Communication: Wife  Labs   CBC: Recent Labs  Lab 03/27/2018 1611 04/01/18 0431 04/02/18 0510 04/03/18 0353  WBC 10.7* 8.2 10.9* 11.3*  NEUTROABS 9.9*  --  9.8* 9.7*  HGB 9.5* 8.2* 8.8* 9.0*  HCT 31.2* 26.3* 28.2* 28.7*  MCV 109.5* 106.0* 107.6* 109.1*  PLT 226 205 224 315    Basic Metabolic Panel: Recent Labs  Lab 03/27/2018 1611 04/01/18 0431 04/02/18 0510 04/03/18 0353  NA 138 133* 135 138  K 4.5 4.4 4.6 4.7  CL 103 102 100 105  CO2 26 25 25 24   GLUCOSE 140* 126* 127* 94  BUN 21 21 31* 34*  CREATININE 1.06 0.89 0.93 0.99  CALCIUM 8.3* 7.6* 8.3* 8.5*  MG  --   --  2.3 2.4  PHOS  --   --  3.4 3.8   GFR: Estimated Creatinine Clearance: 50.5 mL/min (by C-G formula based on SCr of 0.99 mg/dL). Recent Labs  Lab 04/18/2018 1611 04/01/18 0431 04/01/18 1834 04/02/18 0510 04/03/18 0353  PROCALCITON  --   --  <0.10 <0.10 <0.10  WBC 10.7* 8.2  --  10.9* 11.3*    Liver Function Tests: Recent Labs  Lab 03/26/2018 1611 04/01/18 0431 04/02/18 0510 04/03/18 0353  AST 23 15 22 17   ALT 18 14 20 16   ALKPHOS 38 31* 36* 34*  BILITOT 0.7 0.3 0.3 0.4  PROT 6.1* 5.3* 5.7* 5.7*  ALBUMIN 2.5* 2.0* 2.2* 2.2*   No results for input(s): LIPASE, AMYLASE in the last 168 hours. No results for input(s): AMMONIA in the last 168 hours.  ABG    Component Value Date/Time   PHART  7.501 (H) 04/01/2018 1800   PCO2ART 33.8 04/01/2018 1800   PO2ART 43.9 (L) 04/01/2018 1800   HCO3 26.2 04/01/2018 1800   TCO2 22 07/19/2014 1317   ACIDBASEDEF 0.2 03/14/2018 1300   O2SAT 78.4 04/01/2018 1800     Coagulation Profile: No results for input(s): INR, PROTIME in the last 168 hours.  Cardiac Enzymes: Recent Labs  Lab 04/18/2018 1611 04/01/18 1834 04/02/18 0057 04/02/18 0510  TROPONINI <0.03 <0.03 <0.03 <0.03    HbA1C: No results found for: HGBA1C  CBG: Recent Labs  Lab 04/02/18 1219 04/02/18 1637 04/02/18 2000 04/02/18 2329 04/03/18 0344  GLUCAP 96 120* 122* 124* 91   The patient is critically ill with multiple organ systems failure and requires high complexity decision making for assessment and support, frequent evaluation and titration of therapies, application of advanced monitoring technologies and extensive interpretation of multiple databases.   Critical Care Time devoted to patient care services described in this note is 55mins  Sherrilyn Rist, MD Cell: [phone]

## 2018-04-03 NOTE — Progress Notes (Signed)
PROGRESS NOTE    Alexander Ellis  RSW:[id number] DOB: 1946-01-04 DOA: 04/08/2018 PCP: [name], NP   Brief Narrative:  HPI per Dr. Jossie Ng on 03/29/2018 Alexander Ellis is a 72 y.o. male with medical history significant of COPD, prostate cancer with bone metastasis, lung cancer, hepatitis B, GERD, abdominal aortic aneurysm, who was in the hospital until 2 weeks ago when he was discharged home.  He was admitted then with COPD exacerbation.  He is on 6 to 8 L of oxygen at home.  He had pneumonia also during that hospitalization.  Patient completed IV antibiotics and steroids.  He continues to take prednisone up until today.  In the last few days he has continues to have significant shortness of breath and wheezing.  Also cough which is productive of yellow sputum.  He has had significant exertional dyspnea and even while staying stable in bed.  No fever or chills no nausea vomiting no diarrhea.  Patient has had chest discomfort with the cough.  He has tried his home breathing treatment with no help.  In the ER he was noted to have oxygen sats of 80% on 8 L initially.  Patient given breathing treatments and is being admitted for pneumonia that was found in his right mid lobe.  ED Course: Temperature 96.8 blood pressure 140/68 pulse 79 respirate 25 oxygen sat 98% room air.  White count is 10.7 hemoglobin 9.5 and platelet 226.  Sodium 138 potassium 4.5 chloride 103 CO2 26 BUN 21 creatinine 1.06 and calcium 8.3.  Glucose 140.  Chest x-ray showed increased opacity in the right midlung presumably pneumonia with background of severe emphysema.  CT angiogram of the chest showed no evidence of PE mainly diffuse increase in findings of pneumonia and some pulmonary edema  **Patient decompensated yesterday afternoon and became very hypoxic and short of breath on his home regimen of 8 L.  Desaturated on high 60s and was transferred to the stepdown unit for further evaluation and monitoring placed on heated high flow  nasal cannula.  PCCM physician Dr. Rodman Pickle was consulted for further evaluation recommendations given his worsening hypoxia.  Overnight on 11/9-11/10 the patient decompensated again and had to be placed on BiPAP.  Palliative care medicine was called for further evaluation recommendations given his worsening condition and high risk for further decompensation but wife very disinterested in [place].  Dr. Loanne Drilling had a conversation with the patient and the patient's family and after lengthy discussion his CODE STATUS was changed to DNR.  Oncology Dr. Marin Olp consulted for further evaluation and he thinks that a PET scan would be needed but only can be done as outpatient.  Assessment & Plan:   Principal Problem:   Pneumonia Active Problems:   AAA (abdominal aortic aneurysm) (HCC)   Chronic hepatitis B (HCC)   Prostate cancer metastatic to bone (HCC)   DOE (dyspnea on exertion)   Protein-calorie malnutrition, severe  Multifactorial acute on chronic respiratory failure with hypoxia in the setting of severe emphysematous COPD along with metastatic prostate cancer with mets to the lungs and volume overload from suspected CHF and HCAP pneumonia; requiring noninvasive positive pressure ventilation with BiPAP and now oh Hi-Flow Nasal Cannula -Patient normally wears 6-8 L at home and became hypoxic on 8 L today and had to be placed on a nonrebreather and transferred to the stepdown unit for further monitoring and evaluation; was on heated high flow nasal cannula and now had to be placed on BiPAP  and was on 60% FiO2 yesterday morning but has now been transitioned to oxygen saturation of 88 to 90% -PCCM/pulmonary consulted for further recommendations -CTA of the chest showed no PE (central/lobar/proximal segmental) PE but did show interval diffuse increase in intralobular septal thickening and small bilateral pleural effusion compatible with pulmonary edema.  There is also mildly improved right  perihilar consolidations which may be related to a post obstructive pneumonitis or pneumonia.  Patient did have increase in size of pulmonary metastasis and extensive mediastinal and hilar adenopathy along with grossly stable bony diffuse metastatic disease -Discussed with the patient about CODE STATUS and he wants to be a partial code and wants everything except intubation yesterday however this morning goals of care discussion with Dr. Loanne Drilling revealed that his CODE STATUS will be changed to DNR as he does not want chest compressions now -Continue with duo nebs 3 mL 3 times daily along with 3 mL's every 4 PRN for wheezing or shortness breath -Continue with budesonide 0.25 mg nebulized twice daily -Continue with guaifenesin 1200 mg p.o. twice daily-given Solu-Cortef 200 mg IV once yesterday and I discussed with PCCM and they will be changing the patient to Methylprednisolone 40 mg Daily for 5 days total and then changing back to chronic 20 mg daily -Check echocardiogram and this was currently done and showed Normal and vigorus Systolic Fxn with EF of 57-84% and Grade 1 Diastolic CHF was doppler parameters were consistent with abnormal LV Relaxation  -Diuresis currently being held by pulmonary -Patient takes 20 mg of prednisone daily breakfast however this was changed to IV Solu-Medrol 40 mg daily by pulmonary -Broad-spectrum antibiotics with Azactam due to penicillin allergy and vancomycin and will continue for total 5 days total -Continue monitor respiratory status very carefully and maintain O2 saturations greater than 88% -Repeat chest x-ray in the a.m. -Swallow screen -Procalcitonin level was less than 0.10 back in 03/16/2018  And repeat again was <0.10 -Outpatient pulmonary notes revealed that they were in the process of increasing his oxygen requirements with a home concentrator with 10 L -Recently saw pulmonary 03/28/2018 hospital to be brought back within a week to see Dr. Melvyn Novas to adjust his  oxygen settings but unfortunately got hospitalized -Continue to monitor volume status extremely carefully worsening respiratory status with respirations in the 40s yesterday morning -Palliative care consulted for further goals of care discussion and patient states that he feels at times he was suffering but does not want to be comfort care and wife did not want palliative care to come back.  Patient and family declined low-dose morphine for breathing -Chest x-ray appears stable from prior study.  Metastatic prostate cancer to the lungs as well as a bone -Is post 2 chemotherapy cycles with Taxotere 3 (D/C'D) -Last PSA had gone up to 244 -Will notify Dr. Marin Olp via Epic; Dr. Marin Olp saw the patient today and thinks that he did not have a lot of progressive disease and would likely want to do a PET scan however cannot be done inpatient.  Dr. Marin Olp thinks this could be lymphangitic spread and is obtaining a PSA -Patient's oncologist recommended comfort care and he is no longer candidate for systemic chemotherapy as his ECOG score is not good enough and Dr. Marin Olp felt like he had less than 20% chance of helping with systemic therapy -Continue pain control with hydrocodone-acetaminophen seven-point 5-325 mg 1 to 2 tablets every 4 hours PRN for moderate pain pain with ondansetron 8 mg p.o. twice daily for refractory nausea  or vomiting along with prochlorperazine 10 mg p.o. every 6 hours as needed for nausea vomiting -Palliative Care Consulted for [facility] Discussion -Patient is now DNR  Neovascular Glaucoma -Continue with dorzolamide-timolol 1 drop in the left eye twice daily and prednisolone acetate 1 drop to left eye 4 times daily  GERD -Continue with Famotidine 20 mg p.o. twice daily  Pulmonary Edema  2/2 to Acute Grade 1 Diastolic CHF -BNP was not very elevated however chest CT scan showed pulmonary edema -Diuresis as able but now held due to Pulmonary Recc's and will not be given any further  diuresis currently. -Strict I's and O's and daily weights -Stopped IV fluid hydration  -Patient is -168 mL -Wet is up 1 pound since admission -Continue to Monitor Volume status carefully  Long-term tobacco user -Recently quit in July -Congratulated the patient on stopping smoking  Chronic hepatitis B virus infection -Table and at baseline  Liver cirrhosis -May need abdominal ultrasound but will hold -LFTs are normal -Did not appear to have ascites on clinical examination  Leukocytosis -WBC went from 10.7 -> 8.2 -> 10.9 -> 11.3 -? In the setting of IV Steroid Demargination -Continue monitor for signs and symptoms of infection -Continue with antibiotic coverage -Repeat CBC in AM   Macrocytic Anemia -Hemoglobin/hematocrit went from 9.5/31.2 and is now 9.0/28.7 -Check Anemia Panel and it showed an iron level of 85, TIBC of 132, TIBC of 217, saturation ratios of 39%, ferritin level 1921, folate level 5.7, and vitamin B12 level 400 -Daily monitor for signs and symptoms of bleeding -Repeat CBC in a.m.  Severe protein calorie malnutrition -nutrition is consulted for further evaluation recommendations -Patient was started on Ensure and live p.o. 3 times daily along with a daily multivitamin and was encouraged to take p.o. intake was diet was advanced  DVT prophylaxis: Heparin 5,000 units sq q8h Code Status: DO NOT RESUSCITATE Family Communication: Discussed with Wife and Family at bedside Disposition Plan: Pending further [place] Conversations  Consultants:   PCCM/Pulmonary  Palliative Care Medicine   Medical Oncology   Procedures:  ECHOCARDIOGRAM ------------------------------------------------------------------- Study Conclusions  - Left ventricle: The cavity size was normal. Wall thickness was   increased in a pattern of mild LVH. Systolic function was   vigorous. The estimated ejection fraction was in the range of 65%   to 70%. Wall motion was normal; there were no  regional wall   motion abnormalities. Doppler parameters are consistent with   abnormal left ventricular relaxation (grade 1 diastolic   dysfunction). - Aortic valve: Moderately to severely calcified annulus.   Trileaflet; mildly thickened leaflets. - Mitral valve: Mildly thickened leaflets . - Left atrium: The atrium was mildly dilated. - Atrial septum: No defect or patent foramen ovale was identified. - Tricuspid valve: There was mild regurgitation.   Antimicrobials:  Anti-infectives (From admission, onward)   Start     Dose/Rate Route Frequency Ordered Stop   04/01/18 2200  vancomycin (VANCOCIN) IVPB 750 mg/150 ml premix     750 mg 150 mL/hr over 60 Minutes Intravenous Every 24 hours 04/20/2018 2116     04/01/18 0000  aztreonam (AZACTAM) 2 g in sodium chloride 0.9 % 100 mL IVPB     2 g 200 mL/hr over 30 Minutes Intravenous Every 8 hours 04/15/2018 2011 04/08/18 2359   04/07/2018 1745  vancomycin (VANCOCIN) IVPB 1000 mg/200 mL premix     1,000 mg 200 mL/hr over 60 Minutes Intravenous  Once 04/16/2018 1734 04/19/2018 2221   04/18/2018 1730  aztreonam (AZACTAM) 2 g in sodium chloride 0.9 % 100 mL IVPB     2 g 200 mL/hr over 30 Minutes Intravenous  Once 04/09/2018 1725 04/01/2018 1823     Subjective: Seen and examined and states that he is very anxious overnight wearing the BiPAP.  Was requesting something for his anxiety.  No chest pain, lightheadedness or dizziness.  States that he still short of breath but feeling a little bit better and has been weaned to high flow nasal cannula.  No other concerns or complaints at this time.  Did not want to speak with palliative and states that he does not want to go comfort care.  Objective: Vitals:   04/03/18 1500 04/03/18 1542 04/03/18 1600 04/03/18 1700  BP: 121/67  (!) 98/59 (!) 113/54  Pulse: 87 86 78 (!) 117  Resp: 16 20 18  (!) 21  Temp:   97.6 F (36.4 C)   TempSrc:   Oral   SpO2: (!) 84% (!) 89% 95% 94%  Weight:      Height:         Intake/Output Summary (Last 24 hours) at 04/03/2018 1820 Last data filed at 04/03/2018 1717 Gross per 24 hour  Intake 1352.36 ml  Output 575 ml  Net 777.36 ml   Filed Weights   04/01/18 1727 04/02/18 0500 04/03/18 0354  Weight: 55.1 kg 52.7 kg 54.5 kg   Examination: Physical Exam:  Constitutional: Thin, frail, cachectic and chronically ill-appearing Caucasian male who is in some respiratory distress and appears extremely anxious and uncomfortable. Eyes: Sclera is anicteric.  Lids are normal. ENMT: External ears and nose appear normal.  Has extremely poor dentition Neck: Appears supple no JVD Respiratory: Severely diminished bilaterally with coarse breath sounds in the rhonchi noted on the right compared to the left.  Has some accessory muscle usage and is now on high flow nasal cannula. Cardiovascular: Tachycardic rate but regular rhythm.  No lower extremity edema noted Abdomen: Soft, nontender, nondistended.  Bowel sounds present all 4 quadrants GU: Deferred Musculoskeletal: No contractures or cyanosis.  No joint deformities noted Skin: Skin is warm and dry no appreciable rashes or lesions limited skin evaluation Neurologic: Cranial nerves II through XII grossly intact no appreciable focal deficits Psychiatric: Extremely anxious today by appropriate affect.  Intact judgment insight.  Patient is awake alert and oriented  Data Reviewed: I have personally reviewed following labs and imaging studies  CBC: Recent Labs  Lab 03/26/2018 1611 04/01/18 0431 04/02/18 0510 04/03/18 0353  WBC 10.7* 8.2 10.9* 11.3*  NEUTROABS 9.9*  --  9.8* 9.7*  HGB 9.5* 8.2* 8.8* 9.0*  HCT 31.2* 26.3* 28.2* 28.7*  MCV 109.5* 106.0* 107.6* 109.1*  PLT 226 205 224 347   Basic Metabolic Panel: Recent Labs  Lab 04/18/2018 1611 04/01/18 0431 04/02/18 0510 04/03/18 0353  NA 138 133* 135 138  K 4.5 4.4 4.6 4.7  CL 103 102 100 105  CO2 26 25 25 24   GLUCOSE 140* 126* 127* 94  BUN 21 21 31* 34*   CREATININE 1.06 0.89 0.93 0.99  CALCIUM 8.3* 7.6* 8.3* 8.5*  MG  --   --  2.3 2.4  PHOS  --   --  3.4 3.8   GFR: Estimated Creatinine Clearance: 50.5 mL/min (by C-G formula based on SCr of 0.99 mg/dL). Liver Function Tests: Recent Labs  Lab 04/07/2018 1611 04/01/18 0431 04/02/18 0510 04/03/18 0353  AST 23 15 22 17   ALT 18 14 20 16   ALKPHOS 38 31*  36* 34*  BILITOT 0.7 0.3 0.3 0.4  PROT 6.1* 5.3* 5.7* 5.7*  ALBUMIN 2.5* 2.0* 2.2* 2.2*   No results for input(s): LIPASE, AMYLASE in the last 168 hours. No results for input(s): AMMONIA in the last 168 hours. Coagulation Profile: No results for input(s): INR, PROTIME in the last 168 hours. Cardiac Enzymes: Recent Labs  Lab 04/12/2018 1611 04/01/18 1834 04/02/18 0057 04/02/18 0510  TROPONINI <0.03 <0.03 <0.03 <0.03   BNP (last 3 results) No results for input(s): PROBNP in the last 8760 hours. HbA1C: No results for input(s): HGBA1C in the last 72 hours. CBG: Recent Labs  Lab 04/03/18 0344 04/03/18 0732 04/03/18 0832 04/03/18 1148 04/03/18 1655  GLUCAP 91 68* 112* 117* 118*   Lipid Profile: No results for input(s): CHOL, HDL, LDLCALC, TRIG, CHOLHDL, LDLDIRECT in the last 72 hours. Thyroid Function Tests: No results for input(s): TSH, T4TOTAL, FREET4, T3FREE, THYROIDAB in the last 72 hours. Anemia Panel: Recent Labs    04/03/18 0353  VITAMINB12 400  FOLATE 5.7*  FERRITIN 1,921*  TIBC 217*  IRON 85  RETICCTPCT 2.0   Sepsis Labs: Recent Labs  Lab 04/01/18 1834 04/02/18 0510 04/03/18 0353  PROCALCITON <0.10 <0.10 <0.10    Recent Results (from the past 240 hour(s))  Culture, blood (routine x 2) Call MD if unable to obtain prior to antibiotics being given     Status: None (Preliminary result)   Collection Time: 04/17/2018  8:28 PM  Result Value Ref Range Status   Specimen Description   Final    BLOOD LEFT Performed at [facility], [place] [address], [place], [place] [address]    Special  Requests   Final    BOTTLES DRAWN AEROBIC ONLY Blood Culture adequate volume Performed at [facility] [address], [place], [place] [address]    Culture   Final    NO GROWTH 3 DAYS Performed at [facility], [place] [address], [place], [place] [address]    Report Status PENDING  Incomplete  Culture, blood (routine x 2) Call MD if unable to obtain prior to antibiotics being given     Status: None (Preliminary result)   Collection Time: 03/28/2018  8:29 PM  Result Value Ref Range Status   Specimen Description   Final    BLOOD LEFT Performed at [facility] [address] [place], [place] [address]    Special Requests   Final    BOTTLES DRAWN AEROBIC ONLY Blood Culture adequate volume Performed at [facility] [address], [place], [place] [address]    Culture   Final    NO GROWTH 3 DAYS Performed at [facility], [place] [address] [place], [place] [address]    Report Status PENDING  Incomplete  Expectorated sputum assessment w rflx to resp cult     Status: None   Collection Time: 04/01/18  5:57 PM  Result Value Ref Range Status   Specimen Description SPUTUM  Final   Special Requests NONE  Final   Sputum evaluation   Final    Sputum specimen not acceptable for testing.  Please recollect.   NOTIFIED ARNOLD,A RN @1909  ON 04/01/18 JACKSON,K Performed at [facility], [place] [address] [place], [place] [address]    Report Status 04/01/2018 FINAL  Final  MRSA PCR Screening     Status: None   Collection Time: 04/01/18  6:40 PM  Result Value Ref Range Status   MRSA  by PCR NEGATIVE NEGATIVE Final    Comment:        The GeneXpert MRSA Assay (FDA approved for NASAL specimens only), is one component of a comprehensive MRSA colonization surveillance program. It is not intended to diagnose MRSA infection nor to guide or monitor treatment for MRSA infections. Performed at [facility], [place] [address] [place], [place] [address]     Radiology Studies: Dg Chest Port 1 View  Result Date: 04/03/2018 CLINICAL DATA:  Shortness of breath EXAM: PORTABLE CHEST 1 VIEW COMPARISON:  04/19/2018 FINDINGS: Cardiac shadow is stable. Right chest wall port is again seen. Multiple left rib fractures are again noted. Stable chronic emphysematous changes are noted. Some mild vascular congestion and patchy infiltrative opacities are again identified bilaterally relatively stable from the prior exam. Portion of these are related to the known underlying metastatic disease. No acute bony abnormality is noted. IMPRESSION: Stable appearance of the chest when compared with the prior study. Electronically Signed   By: Inez Catalina M.D.   On: 04/03/2018 07:06   Scheduled Meds: . acidophilus  1 capsule Oral Daily  . arformoterol  15 mcg Nebulization BID  . budesonide (PULMICORT) nebulizer solution  0.25 mg Nebulization BID  . chlorhexidine  15 mL Mouth/Throat BID  . chlorhexidine  15 mL Mouth Rinse BID  . Chlorhexidine Gluconate Cloth  6 each Topical Q0600  . dorzolamide-timolol  1 drop Left Eye BID  . famotidine  20 mg Oral BID  . feeding supplement (ENSURE ENLIVE)  237 mL Oral TID BM  . guaiFENesin  1,200 mg Oral BID  . heparin  5,000 Units Subcutaneous Q8H  . ipratropium-albuterol  3 mL Nebulization TID  . mouth rinse  15 mL Mouth Rinse q12n4p  . methylPREDNISolone (SOLU-MEDROL) injection  40 mg Intravenous Daily  . multivitamin with minerals  1 tablet Oral Daily  . prednisoLONE acetate  1 drop Left Eye QID   Continuous Infusions: . aztreonam Stopped (04/03/18 1717)  . vancomycin Stopped (04/02/18 2220)    LOS: 3 days   Kerney Elbe, DO [facility] PAGER is on [place]  If 7PM-7AM, please contact night-coverage [email or web address] Password [email or web address] 04/03/2018, 6:20 PM

## 2018-04-03 NOTE — Consult Note (Signed)
Referral MD  Reason for Referral: Shortness of breath; metastatic prostate cancer  Chief Complaint  Patient presents with  . Shortness of Breath  : I have a hard time breathing.  HPI: Alexander Ellis is well-known to me.  He is a 72 year old white male.  He has metastatic prostate cancer.  He is now off therapy.  He had been on systemic therapy which he tolerated poorly.  He was admitted last week because of progressive shortness of breath.  He came in on 04/22/2018.  When he was admitted, his labs showed a white cell count of 10.7.  Hemoglobin is 9.5 and platelet count 226,000.  His white cell count has stayed stable.  His hemoglobin has trended downward a little bit.  Today, his white cell count 11.3 hemoglobin 9 and platelet count 234,000.  His electrolytes when he came in showed a sodium of 138 potassium 4.5 BUN 21 creatinine 1.06.  Calcium 8.3 with an albumin of 2.5.  He had a CT angiogram done.  This was done on 04/05/2018.  This showed interval diffuse increase in septal thickening and small bilateral pleural effusions compatible with pulmonary edema.  He had mild increased size of pulmonary metastasis.  He had stable style and hilar adenopathy.  He has stable bony metastasis.  He is on antibiotics.  Cultures are all negative.  His PSA has not been done.  We are asked to see him to try to help out.  He has had no fever.  He has had a little bit of hemoptysis.  He has had no nausea or vomiting.  I think he may have a little bit of diarrhea.  He has had no rashes.  It looks like he has lost weight.  His appetite is marginal.  He is a DO NOT RESUSCITATE.  This is very reasonable.  He is on steroids.  He is on nebulizers.  He does not seem to be getting much better.  I suppose that we could be seen lymphangitic tumor spread.  This would be unusual for prostate cancer.  However, it is certainly not unheard of.  Overall, his performance status is ECOG 3.            Past  Medical History:  Diagnosis Date  . Anemia   . Aneurysm of abdominal aorta (HCC)   . Bone metastasis ([place])   . Cancer [facility]) 2014   prostate  . Chronic hepatitis B virus infection ([place])   . Cirrhosis ([place])   . COPD (chronic obstructive pulmonary disease) ([place])   . Dyspnea   . GERD (gastroesophageal reflux disease)   . GERD (gastroesophageal reflux disease)   . Hand pain   . Hepatitis   . Hypotension   . Lung nodules   . Osteoarthritis   :  Past Surgical History:  Procedure Laterality Date  . APPENDECTOMY    . BACK SURGERY     [id number]  . IR FLUORO GUIDE PORT INSERTION RIGHT  09/06/2017  . IR US GUIDE VASC ACCESS RIGHT  09/06/2017  . PROSTATECTOMY  2012  . THROAT SURGERY    . TONSILLECTOMY    :   Current Facility-Administered Medications:  .  acetaminophen (TYLENOL) tablet 500 mg, 500 mg, Oral, Q8H PRN, Jonelle Sidle, Mohammad L, MD, 500 mg at 04/01/18 1620 .  acidophilus (RISAQUAD) capsule 1 capsule, 1 capsule, Oral, Daily, Jonelle Sidle, Mohammad L, MD, 1 capsule at 04/02/18 1106 .  arformoterol (BROVANA) nebulizer solution 15 mcg, 15 mcg, Nebulization, BID, Loanne Drilling, [place],  MD, 15 mcg at 04/02/18 2025 .  aztreonam (AZACTAM) 2 g in sodium chloride 0.9 % 100 mL IVPB, 2 g, Intravenous, Q8H, Elwyn Reach, MD, Stopped at 04/02/18 2352 .  budesonide (PULMICORT) nebulizer solution 0.25 mg, 0.25 mg, Nebulization, BID, Raiford Noble Latif, DO, 0.25 mg at 04/02/18 2025 .  chlorhexidine (PERIDEX) 0.12 % solution 15 mL, 15 mL, Mouth/Throat, BID, Jonelle Sidle, Mohammad L, MD, 15 mL at 04/02/18 2111 .  Chlorhexidine Gluconate Cloth 2 % PADS 6 each, 6 each, Topical, Q0600, Raiford Noble [place], [place], 6 each at 04/03/18 0506 .  dorzolamide-timolol (COSOPT) 22.3-6.8 MG/ML ophthalmic solution 1 drop, 1 drop, Left Eye, BID, Jonelle Sidle, Mohammad L, MD, 1 drop at 04/02/18 2111 .  famotidine (PEPCID) tablet 20 mg, 20 mg, Oral, BID, Gala Romney L, MD, 20 mg at 04/02/18 1936 .  feeding supplement (ENSURE  ENLIVE) (ENSURE ENLIVE) liquid 237 mL, 237 mL, Oral, BID BM, Garba, Mohammad L, MD, 237 mL at 04/01/18 1541 .  guaiFENesin (MUCINEX) 12 hr tablet 1,200 mg, 1,200 mg, Oral, BID, Raiford Noble [place], DO, 1,200 mg at 04/02/18 2111 .  heparin injection 5,000 Units, 5,000 Units, Subcutaneous, Q8H, Elwyn Reach, MD, 5,000 Units at 04/03/18 0505 .  HYDROcodone-acetaminophen (NORCO) 7.5-325 MG per tablet 1-2 tablet, 1-2 tablet, Oral, Q4H PRN, Jonelle Sidle, Mohammad L, MD .  ipratropium-albuterol (DUONEB) 0.5-2.5 (3) MG/3ML nebulizer solution 3 mL, 3 mL, Nebulization, TID, Sheikh, Omair Latif, DO, 3 mL at 04/02/18 2025 .  ipratropium-albuterol (DUONEB) 0.5-2.5 (3) MG/3ML nebulizer solution 3 mL, 3 mL, Nebulization, Q4H PRN, Sheikh, Omair Latif, DO, 3 mL at 04/01/18 1540 .  lip balm (CARMEX) ointment, , Topical, PRN, Sheikh, Omair Latif, DO .  methylPREDNISolone sodium succinate (SOLU-MEDROL) 40 mg/mL injection 40 mg, 40 mg, Intravenous, Daily, Margaretha Seeds, MD, 40 mg at 04/02/18 1104 .  morphine 2 MG/ML injection 1-2 mg, 1-2 mg, Intravenous, Q2H PRN, Micheline Rough, MD, 2 mg at 04/03/18 0506 .  MUSCLE RUB CREA 1 application, 1 application, Topical, Daily PRN, Garba, Mohammad L, MD .  ondansetron (ZOFRAN) tablet 8 mg, 8 mg, Oral, BID PRN, Jonelle Sidle, Mohammad L, MD .  prednisoLONE acetate (PRED FORTE) 1 % ophthalmic suspension 1 drop, 1 drop, Left Eye, QID, Elwyn Reach, MD, 1 drop at 04/02/18 2111 .  prochlorperazine (COMPAZINE) tablet 10 mg, 10 mg, Oral, Q6H PRN, Jonelle Sidle, Mohammad L, MD .  sodium chloride flush (NS) 0.9 % injection 10-40 mL, 10-40 mL, Intracatheter, PRN, Jonelle Sidle, Mohammad L, MD .  vancomycin (VANCOCIN) IVPB 750 mg/150 ml premix, 750 mg, Intravenous, Q24H, Shade, Haze Justin, RPH, Stopped at 04/02/18 2220  Facility-Administered Medications Ordered in Other Encounters:  .  sodium chloride flush (NS) 0.9 % injection 10 mL, 10 mL, Intravenous, PRN, Volanda Napoleon, MD, 10 mL at 11/16/17  1336:  . acidophilus  1 capsule Oral Daily  . arformoterol  15 mcg Nebulization BID  . budesonide (PULMICORT) nebulizer solution  0.25 mg Nebulization BID  . chlorhexidine  15 mL Mouth/Throat BID  . Chlorhexidine Gluconate Cloth  6 each Topical Q0600  . dorzolamide-timolol  1 drop Left Eye BID  . famotidine  20 mg Oral BID  . feeding supplement (ENSURE ENLIVE)  237 mL Oral BID BM  . guaiFENesin  1,200 mg Oral BID  . heparin  5,000 Units Subcutaneous Q8H  . ipratropium-albuterol  3 mL Nebulization TID  . methylPREDNISolone (SOLU-MEDROL) injection  40 mg Intravenous Daily  . prednisoLONE acetate  1 drop Left Eye QID  :  Allergies  Allergen Reactions  . Oxycodone Other (See Comments)    somnolence Sleep all the time  . Penicillins Anaphylaxis, Hives and Rash    Has patient had a PCN reaction causing immediate rash, facial/tongue/throat swelling, SOB or lightheadedness with hypotension: Yes Has patient had a PCN reaction causing severe rash involving mucus membranes or skin necrosis: No Has patient had a PCN reaction that required hospitalization: No Has patient had a PCN reaction occurring within the last 10 years: Yes If all of the above answers are "NO", then may proceed with Cephalosporin use.   . Codeine Nausea And Vomiting  . Other Nausea Only and Other (See Comments)    Uncoded Allergy. Allergen: IV contrast  . Hydrocodone-Acetaminophen Nausea And Vomiting  . Iohexol Nausea And Vomiting  :  Family History  Problem Relation Age of Onset  . Heart disease Mother   . Hypertension Mother   . Emphysema Father        smoked  :  Social History   Socioeconomic History  . Marital status: Married    Spouse name: Not on file  . Number of children: Not on file  . Years of education: Not on file  . Highest education level: Not on file  Occupational History  . Not on file  Social Needs  . Financial resource strain: Not on file  . Food insecurity:    Worry: Not on file     Inability: Not on file  . Transportation needs:    Medical: Not on file    Non-medical: Not on file  Tobacco Use  . Smoking status: Former Smoker    Packs/day: 0.50    Years: 60.00    Pack years: 30.00    Types: Cigarettes    Last attempt to quit: 12/20/2017    Years since quitting: 0.2  . Smokeless tobacco: Never Used  Substance and Sexual Activity  . Alcohol use: No    Alcohol/week: 0.0 standard drinks  . Drug use: No  . Sexual activity: Not on file  Lifestyle  . Physical activity:    Days per week: Not on file    Minutes per session: Not on file  . Stress: Not on file  Relationships  . Social connections:    Talks on phone: Not on file    Gets together: Not on file    Attends religious service: Not on file    Active member of club or organization: Not on file    Attends meetings of clubs or organizations: Not on file    Relationship status: Not on file  . Intimate partner violence:    Fear of current or ex partner: Not on file    Emotionally abused: Not on file    Physically abused: Not on file    Forced sexual activity: Not on file  Other Topics Concern  . Not on file  Social History Narrative  . Not on file  :  Review of Systems  Constitutional: Positive for malaise/fatigue and weight loss.  HENT: Negative.   Eyes: Negative.   Respiratory: Positive for cough and shortness of breath.   Cardiovascular: Positive for chest pain and claudication.  Gastrointestinal: Positive for abdominal pain and nausea.  Genitourinary: Negative.   Musculoskeletal: Positive for joint pain and myalgias.  Skin: Negative.   Neurological: Negative.   Endo/Heme/Allergies: Negative.   Psychiatric/Behavioral: The patient is nervous/anxious.      Exam: As above Patient Vitals for the past 24 hrs:  BP Temp  Temp src Pulse Resp SpO2 Weight  04/03/18 0500 (!) 98/55 - - 72 11 98 % -  04/03/18 0410 116/61 - - 68 20 97 % -  04/03/18 0400 - - - 68 19 99 % -  04/03/18 0354 - - - - - -  120 lb 2.4 oz (54.5 kg)  04/03/18 0300 108/61 - - 72 14 99 % -  04/03/18 0200 (!) 109/53 - - 68 18 98 % -  04/03/18 0143 (!) 107/56 - - 71 17 99 % -  04/03/18 0100 - - - 65 13 98 % -  04/03/18 0000 - - - 66 18 97 % -  04/02/18 2325 - (!) 97.3 F (36.3 C) Axillary - - - -  04/02/18 2300 - - - 65 (!) 21 98 % -  04/02/18 2200 - - - 64 (!) 21 99 % -  04/02/18 2100 - - - 69 19 98 % -  04/02/18 2025 - - - 71 (!) 25 98 % -  04/02/18 2003 - (!) 97.4 F (36.3 C) Axillary - - - -  04/02/18 2000 - - - 72 (!) 26 99 % -  04/02/18 1900 - - - 76 (!) 22 96 % -  04/02/18 1704 - 97.8 F (36.6 C) Oral - - - -  04/02/18 1600 (!) 118/56 - - - (!) 31 97 % -  04/02/18 1500 - - - 92 (!) 21 (!) 88 % -  04/02/18 1451 - - - 71 (!) 28 100 % -  04/02/18 1437 - - - - - 98 % -  04/02/18 1200 (!) 117/56 - - 74 (!) 30 98 % -  04/02/18 1100 100/61 - - 74 (!) 22 97 % -  04/02/18 1000 (!) 113/59 - - 74 (!) 24 95 % -  04/02/18 0900 121/66 - - 79 (!) 22 95 % -  04/02/18 0817 - (!) 97.4 F (36.3 C) Axillary - - - -  04/02/18 0800 (!) 143/71 - - 76 (!) 22 97 % -  04/02/18 0742 (!) 110/52 - - 84 (!) 43 92 % -     Recent Labs    04/02/18 0510 04/03/18 0353  WBC 10.9* 11.3*  HGB 8.8* 9.0*  HCT 28.2* 28.7*  PLT 224 234   Recent Labs    04/02/18 0510 04/03/18 0353  NA 135 138  K 4.6 4.7  CL 100 105  CO2 25 24  GLUCOSE 127* 94  BUN 31* 34*  CREATININE 0.93 0.99  CALCIUM 8.3* 8.5*    Blood smear review: None  Pathology: None    Assessment and Plan: Mr. Economou is a 72 year old white male.  He has metastatic prostate cancer.  At least by the CT angiogram, there is does not appear to be a lot of progressive disease.  I think a really good test to do would be the PET scan specific for prostate cancer.  Unfortunately, this cannot be done as an inpatient.  I might sure on how we can prove that he has lymphangitic tumor spread.  I do not know if he would be a candidate for a bronchoscopy and trans-bronchial  biopsies.  This might be a little bit aggressive.  Again, it does not look like he has had that much progression on the CT angiogram.  However, this septal thickening could certainly be indicative of lymphangitic spread.  We will see what his PSA is.  I really cannot argue with how he is being treated.  He is getting great care by all the staff in the ICU.  They are doing a fantastic job with him.  I totally agree with the DO NOT RESUSCITATE status.  Our focus is quality of life.  Our focus is comfort.  I want to make sure that we can improve his status so that he can enjoy whatever time he has left.  He is on CPAP right now.  It really tough to live like this.  This certainly would not benefit his quality of life.  We will follow along and try to help out any way that we can.  Of note, he had a echocardiogram on 04/02/2018.  This showed a good LVEF of 65-70%.  Lattie Haw, MD  Alexander Ellis 1:7

## 2018-04-03 NOTE — Progress Notes (Signed)
Initial Nutrition Assessment  DOCUMENTATION CODES:   Severe malnutrition in context of chronic illness, Underweight  INTERVENTION:  - Will increase Ensure Enlive to TID, each supplement provides 350 kcal and 20 grams of protein. - Will order daily multivitamin with minerals. - Continue to encourage PO intakes once diet formally advanced.   NUTRITION DIAGNOSIS:   Severe Malnutrition related to chronic illness, catabolic illness, cancer and cancer related treatments as evidenced by severe fat depletion, severe muscle depletion.  GOAL:   Patient will meet greater than or equal to 90% of their needs  MONITOR:   PO intake, Supplement acceptance, Diet advancement, Weight trends, Labs  REASON FOR ASSESSMENT:   Malnutrition Screening Tool  ASSESSMENT:   72 year old male with history of prostate cancer with bone metastases, COPD, on chronic prednisone therapy, AAA, hepatitis B, and GERD. He was admitted for COP exacerbation.  Patient has been NPO since admission d/t being on BiPAP. Spoke with RN prior to visiting patient. She reported that patient had been on BiPAP nearly all day Saturday and all day, except for 2 hours, yesterday. Patient was taken off of BiPAP late this AM and remained off through the time of RD visit around noon.  RN reported that patient consumed 2 bottles of Ensure without any issue and that she was going to provide him with chicken broth as well--patient confirms this. RN reports no plan for diet advancement at this time until it is better known how patient does while off of BiPAP.  Patient denies any abdominal pain or nausea at this time or PTA. She reports being able to eat soft solid foods PTA and that he had dentures he would wear. Family, who is at bedside, reports that patient last ate soft solid food on Friday (11/8). Patient is interested in having Ensure increased to TID.   NFPE outlined below. Per chart review, current weight is 120 lb and weight on 10/22  was 127 lb. This indicates 7 lb weight loss (5.5% body weight) in ~3 weeks; significant for time frame.   Patient is being followed by Palliative Care. He is DNR/DNI, not comfort care, with plan to continue current care. Dr. Kirstie Mirza note from yesterday states prognosis is "?hours-days?.    Medications reviewed; 1 capsule Risaquad/day, 25 mL D50 x1 dose this AM, 20 mg oral Pepcid BID, 40 mg Solu-medrol/day. Labs reviewed; CBG: 91 mg/dL, BUN: 34 mg/dL, Ca: 8.5 mg/dL.    NUTRITION - FOCUSED PHYSICAL EXAM:    Most Recent Value  Orbital Region  Mild depletion  Upper Arm Region  Severe depletion  Thoracic and Lumbar Region  Unable to assess  Buccal Region  Severe depletion  Temple Region  Moderate depletion  Clavicle Bone Region  Severe depletion  Clavicle and Acromion Bone Region  Severe depletion  Scapular Bone Region  Unable to assess  Dorsal Hand  Severe depletion  Patellar Region  Unable to assess  Anterior Thigh Region  Unable to assess  Posterior Calf Region  Unable to assess  Edema (RD Assessment)  Unable to assess  Hair  Reviewed  Eyes  Reviewed  Mouth  Reviewed  Skin  Reviewed  Nails  Reviewed       Diet Order:   Diet Order            Diet NPO time specified  Diet effective now              EDUCATION NEEDS:   No education needs have been identified at this time  Skin:  Skin Assessment: Reviewed RN Assessment  Last BM:  PTA/unknown  Height:   Ht Readings from Last 1 Encounters:  04/01/18 5\' 9"  (1.753 m)    Weight:   Wt Readings from Last 1 Encounters:  04/03/18 54.5 kg    Ideal Body Weight:  72.73 kg  BMI:  Body mass index is 17.74 kg/m.  Estimated Nutritional Needs:   Kcal:  1910-2180 kcal  Protein:  100-110 grams  Fluid:  >/= 1.9 L/day     Jarome Matin, MS, RD, LDN, [facility] Inpatient Clinical Dietitian Pager # [phone] After hours/weekend pager # [phone]

## 2018-04-03 NOTE — Consult Note (Signed)
Consultation Note Date: 04/03/2018   Patient Name: Alexander Ellis  DOB: 07/30/1945  MRN: [id number]  Age / Sex: 72 y.o., male  PCP: [name], NP Referring Physician: Kerney Elbe, DO  Reason for Consultation: Establishing goals of care  HPI/Patient Profile: 72 y.o. male  with past medical history of metastatic prostate cancer, COPD,  admitted on 04/06/2018 and currently in ICU on Bipap for respiratory failure (pulmonary mets, copd, pulm edema, and pneumonitis/PNA).  Palliative consulted for goals of care.   Clinical Assessment and Goals of Care: I met today with Mr. Mcneese, his wife, and his children.  I introduced palliative care as specialized medical care for people living with serious illness. It focuses on providing relief from the symptoms and stress of a serious illness. The goal is to improve quality of life for both the patient and the family.  We discussed clinical course as well as wishes moving forward in regard to care this hospitalization.  We discussed difference between a aggressive medical intervention path and a palliative, comfort focused care path.  Values and goals of care important to patient and family were attempted to be elicited.  He reports being told that he is going to die from his current situation and is "Ok if this is it."  His wife became upset and stated that they are not "giving up" and she is hopeful that he can improve.  He reports that his current situation is "not living" and asked his thoughts he reports plan to "take it day by day."  Questions and concerns addressed.   PMT will continue to support holistically.  SUMMARY OF RECOMMENDATIONS   - DNR/DNI: continue current therapy. - He reports that there are times when he feels that he is suffering.  Discussed consideration for focus on comfort, and his wife was adamant that "he does not want to be comfort care."   I discussed plan to follow up tomorrow (for symptoms and reassess goals) and his wife stated "you don't have to come back.  He is not going to change his mind." - Also discussed risk vs benefit of low dose opioid for symptomatic relief of dyspnea.  While there is certainly a risk of respiratory depression with opioids, I would still consider low dose morphine prn to see if can help his work of breathing as long as there is understanding of risk vs benefit of this (starting dose would actually be lower opioid equivalent than his currently ordered hydrocodone).  Counseled him and his family, and he declines at this time.  He will let us know if symptoms reach point where he would like to try with understanding of risk involved. - His daughter saw me in the hall and noted that her mother is struggling with his mortality.  Will continue to support and attempt to support her and guide decisions based on his wishes.   Code Status/Advance Care Planning:  DNR  Palliative Prophylaxis:   Frequent Pain Assessment  Additional Recommendations (Limitations, Scope, Preferences):  Full Scope  Treatment until cardiac or respiratory arrest  Psycho-social/Spiritual:   Desire for further Chaplaincy support:no  Additional Recommendations: Caregiving  Support/Resources  Prognosis:  ?Hours - Days?  Discharge Planning: To Be Determined     Primary Diagnoses: Present on Admission: . Pneumonia . Prostate cancer metastatic to bone ([place]) . DOE (dyspnea on exertion) . Chronic hepatitis B ([place]) . AAA (abdominal aortic aneurysm) ([place])   I have reviewed the medical record, interviewed the patient and family, and examined the patient. The following aspects are pertinent.  Past Medical History:  Diagnosis Date  . Anemia   . Aneurysm of abdominal aorta (H[facility])   . Bone metastasis ([place])   . Cancer [facility]) 2014   prostate  . Chronic hepatitis B virus infection ([place])   . Cirrhosis ([place])   . COPD (chronic  obstructive pulmonary disease) ([place])   . Dyspnea   . GERD (gastroesophageal reflux disease)   . GERD (gastroesophageal reflux disease)   . Hand pain   . Hepatitis   . Hypotension   . Lung nodules   . Osteoarthritis    Social History   Socioeconomic History  . Marital status: Married    Spouse name: Not on file  . Number of children: Not on file  . Years of education: Not on file  . Highest education level: Not on file  Occupational History  . Not on file  Social Needs  . Financial resource strain: Not on file  . Food insecurity:    Worry: Not on file    Inability: Not on file  . Transportation needs:    Medical: Not on file    Non-medical: Not on file  Tobacco Use  . Smoking status: Former Smoker    Packs/day: 0.50    Years: 60.00    Pack years: 30.00    Types: Cigarettes    Last attempt to quit: 12/20/2017    Years since quitting: 0.2  . Smokeless tobacco: Never Used  Substance and Sexual Activity  . Alcohol use: No    Alcohol/week: 0.0 standard drinks  . Drug use: No  . Sexual activity: Not on file  Lifestyle  . Physical activity:    Days per week: Not on file    Minutes per session: Not on file  . Stress: Not on file  Relationships  . Social connections:    Talks on phone: Not on file    Gets together: Not on file    Attends religious service: Not on file    Active member of club or organization: Not on file    Attends meetings of clubs or organizations: Not on file    Relationship status: Not on file  Other Topics Concern  . Not on file  Social History Narrative  . Not on file   Family History  Problem Relation Age of Onset  . Heart disease Mother   . Hypertension Mother   . Emphysema Father        smoked   Scheduled Meds: . acidophilus  1 capsule Oral Daily  . arformoterol  15 mcg Nebulization BID  . budesonide (PULMICORT) nebulizer solution  0.25 mg Nebulization BID  . chlorhexidine  15 mL Mouth/Throat BID  . chlorhexidine  15 mL Mouth  Rinse BID  . Chlorhexidine Gluconate Cloth  6 each Topical Q0600  . dorzolamide-timolol  1 drop Left Eye BID  . famotidine  20 mg Oral BID  . feeding supplement (ENSURE ENLIVE)  237 mL Oral BID BM  .  guaiFENesin  1,200 mg Oral BID  . heparin  5,000 Units Subcutaneous Q8H  . ipratropium-albuterol  3 mL Nebulization TID  . mouth rinse  15 mL Mouth Rinse q12n4p  . methylPREDNISolone (SOLU-MEDROL) injection  40 mg Intravenous Daily  . prednisoLONE acetate  1 drop Left Eye QID   Continuous Infusions: . aztreonam 2 g (04/03/18 0834)  . vancomycin Stopped (04/02/18 2220)   PRN Meds:.acetaminophen, HYDROcodone-acetaminophen, ipratropium-albuterol, lip balm, morphine injection, MUSCLE RUB, ondansetron, prochlorperazine, sodium chloride flush Medications Prior to Admission:  Prior to Admission medications   Medication Sig Start Date End Date Taking? Authorizing Provider  acetaminophen (TYLENOL) 500 MG tablet Take 500 mg by mouth every 8 (eight) hours as needed for moderate pain (pain).    Yes [provider]  chlorhexidine (PERIDEX) 0.12 % solution Use as directed 15 mLs in the mouth or throat 2 (two) times daily.   Yes [provider]  dorzolamide-timolol (COSOPT) 22.3-6.8 MG/ML ophthalmic solution Place 1 drop into the left eye 2 (two) times daily.  03/08/18 03/08/19 Yes [provider]  ipratropium (ATROVENT) 0.02 % nebulizer solution Take 2.5 mLs (0.5 mg total) by nebulization every 6 (six) hours as needed for wheezing or shortness of breath. 03/30/18  Yes Lauraine Rinne, NP  Lidocaine 2 % GEL Apply topically. Apply to gums 4 x days with a qtip   Yes [provider]  Menthol-Methyl Salicylate (ICY HOT BALM EXTRA STRENGTH EX) Apply 1 patch topically daily as needed (back pain).   Yes [provider]  Morphine Sulfate (MORPHINE CONCENTRATE) 10 MG/0.5ML SOLN concentrated solution Place 0.25 mLs (5 mg total) under the tongue every 2 (two) hours as needed for  severe pain or shortness of breath. 03/17/18  Yes Ollis, Brandi L, NP  ondansetron (ZOFRAN) 8 MG tablet Take 1 tablet (8 mg total) by mouth 2 (two) times daily as needed for refractory nausea / vomiting. 02/27/18  Yes Volanda Napoleon, MD  prednisoLONE acetate (PRED FORTE) 1 % ophthalmic suspension Place 1 drop into the left eye 4 (four) times daily.  02/01/18  Yes [provider]  predniSONE (DELTASONE) 20 MG tablet 2 tabs PO for one week, then 1.5 tabs for one week then 1 tab daily Patient taking differently: Take 20 mg by mouth daily with breakfast.  03/17/18  Yes Ollis, Brandi L, NP  Probiotic Product (PROBIOTIC PO) Take 1 capsule by mouth daily.   Yes [provider]  HYDROcodone-acetaminophen (NORCO) 7.5-325 MG tablet Take 1-2 tablets by mouth every 4 (four) hours as needed. for pain 12/08/17   [provider]  prochlorperazine (COMPAZINE) 10 MG tablet Take 10 mg by mouth every 6 (six) hours as needed for nausea or vomiting.    [provider]  ranitidine (ZANTAC) 300 MG tablet Take 300 mg by mouth daily as needed for heartburn.  06/01/17   [provider]   Allergies  Allergen Reactions  . Oxycodone Other (See Comments)    somnolence Sleep all the time  . Penicillins Anaphylaxis, Hives and Rash    Has patient had a PCN reaction causing immediate rash, facial/tongue/throat swelling, SOB or lightheadedness with hypotension: Yes Has patient had a PCN reaction causing severe rash involving mucus membranes or skin necrosis: No Has patient had a PCN reaction that required hospitalization: No Has patient had a PCN reaction occurring within the last 10 years: Yes If all of the above answers are "NO", then may proceed with Cephalosporin use.   . Codeine  Nausea And Vomiting  . Other Nausea Only and Other (See Comments)    Uncoded Allergy. Allergen: IV contrast  . Hydrocodone-Acetaminophen Nausea And Vomiting  . Iohexol Nausea And Vomiting   Review of  Systems  HENT: Positive for congestion.   Respiratory: Positive for shortness of breath.   Neurological: Positive for weakness.  Psychiatric/Behavioral: Positive for sleep disturbance.   Physical Exam General: Alert, awake, Chronically ill appearing.    HEENT: On Bipap Heart: Regular rate and rhythm. No murmur appreciated. Lungs: Diminished air movement, clear Abdomen: Soft, nontender, nondistended, positive bowel sounds.  Ext: No significant edema Skin: Warm and dry Neuro: Grossly intact, nonfocal.   Vital Signs: BP 103/62   Pulse 65   Temp (!) 97.5 F (36.4 C) (Axillary)   Resp (!) 25   Ht _0  (1.753 m)   Wt 54.5 kg   SpO2 93%   BMI 17.74 kg/m  Pain Scale: 0-10 POSS *See Group Information*: 1-Acceptable,Awake and alert Pain Score: 0-No pain   SpO2: SpO2: 93 % O2 Device:SpO2: 93 % O2 Flow Rate: .O2 Flow Rate (L/min): 40 L/min  IO: Intake/output summary:   Intake/Output Summary (Last 24 hours) at 04/03/2018 0905 Last data filed at 04/03/2018 0200 Gross per 24 hour  Intake 410 ml  Output 1425 ml  Net -1015 ml    LBM: Last BM Date: 03/30/18 Baseline Weight: Weight: 54.4 kg Most recent weight: Weight: 54.5 kg     Palliative Assessment/Data:    Time Total: 80 minutes Greater than 50%  of this time was spent counseling and coordinating care related to the above assessment and plan.  Signed by: Micheline Rough, MD   Please contact Palliative Medicine Team phone at [phone] for questions and concerns.  For individual provider: See Shea Evans

## 2018-04-03 NOTE — Progress Notes (Signed)
   04/03/18 1611  Clinical Encounter Type  Visited With Patient and family together  Visit Type Initial  Referral From Palliative care team  Consult/Referral To Chaplain  The chaplain followed up with PMT-GF request for spiritual care visit. The Pt. greeted the chaplain as she entered the room.  The Pt.' son and daughter 'n law were present in the room during the visit.  The daughter 'n law confirmed the Pt. received medicine for his anxiousness before my visit, so do not be surprised by what the Pt. may share with you.  The Pt. shared with the chaplain stories about his dogs, grandchildren, and deer eating his garden.  The Pt.'s conversation slowed down when he thought I was going to talk about death and Hospice.  The Pt. invited the chaplain back for another spiritual care visit.

## 2018-04-04 ENCOUNTER — Inpatient Hospital Stay (HOSPITAL_COMMUNITY): Payer: Medicare Other

## 2018-04-04 ENCOUNTER — Ambulatory Visit: Payer: Medicare Other | Admitting: Internal Medicine

## 2018-04-04 DIAGNOSIS — R918 Other nonspecific abnormal finding of lung field: Secondary | ICD-10-CM

## 2018-04-04 DIAGNOSIS — R0602 Shortness of breath: Secondary | ICD-10-CM | POA: Diagnosis present

## 2018-04-04 LAB — GLUCOSE, CAPILLARY: Glucose-Capillary: 113 mg/dL — ABNORMAL HIGH (ref 70–99)

## 2018-04-04 LAB — CBC WITH DIFFERENTIAL/PLATELET
Abs Immature Granulocytes: 0.29 10*3/uL — ABNORMAL HIGH (ref 0.00–0.07)
Abs Immature Granulocytes: 0.32 10*3/uL — ABNORMAL HIGH (ref 0.00–0.07)
BASOS ABS: 0 10*3/uL (ref 0.0–0.1)
BASOS PCT: 0 %
Basophils Absolute: 0 10*3/uL (ref 0.0–0.1)
Basophils Relative: 0 %
EOS ABS: 0 10*3/uL (ref 0.0–0.5)
EOS ABS: 0 10*3/uL (ref 0.0–0.5)
EOS PCT: 0 %
Eosinophils Relative: 0 %
HCT: 28 % — ABNORMAL LOW (ref 39.0–52.0)
HEMATOCRIT: 28.4 % — AB (ref 39.0–52.0)
Hemoglobin: 8.7 g/dL — ABNORMAL LOW (ref 13.0–17.0)
Hemoglobin: 8.7 g/dL — ABNORMAL LOW (ref 13.0–17.0)
IMMATURE GRANULOCYTES: 3 %
Immature Granulocytes: 3 %
LYMPHS PCT: 7 %
Lymphocytes Relative: 3 %
Lymphs Abs: 0.7 10*3/uL (ref 0.7–4.0)
Lymphs Abs: 0.4 10*3/uL — ABNORMAL LOW (ref 0.7–4.0)
MCH: 33.6 pg (ref 26.0–34.0)
MCH: 33.6 pg (ref 26.0–34.0)
MCHC: 31.1 g/dL (ref 30.0–36.0)
MCHC: 30.6 g/dL (ref 30.0–36.0)
MCV: 108.1 fL — ABNORMAL HIGH (ref 80.0–100.0)
MCV: 109.7 fL — AB (ref 80.0–100.0)
MONOS PCT: 2 %
Monocytes Absolute: 0.4 10*3/uL (ref 0.1–1.0)
Monocytes Absolute: 0.3 10*3/uL (ref 0.1–1.0)
Monocytes Relative: 4 %
NEUTROS PCT: 92 %
NRBC: 0.3 % — AB (ref 0.0–0.2)
Neutro Abs: 9.7 10*3/uL — ABNORMAL HIGH (ref 1.7–7.7)
Neutro Abs: 10.8 10*3/uL — ABNORMAL HIGH (ref 1.7–7.7)
Neutrophils Relative %: 86 %
PLATELETS: 236 10*3/uL (ref 150–400)
Platelets: 234 10*3/uL (ref 150–400)
RBC: 2.59 MIL/uL — ABNORMAL LOW (ref 4.22–5.81)
RBC: 2.59 MIL/uL — ABNORMAL LOW (ref 4.22–5.81)
RDW: 15 % (ref 11.5–15.5)
RDW: 15.4 % (ref 11.5–15.5)
WBC: 11.2 10*3/uL — ABNORMAL HIGH (ref 4.0–10.5)
WBC: 11.8 10*3/uL — ABNORMAL HIGH (ref 4.0–10.5)
nRBC: 0 % (ref 0.0–0.2)

## 2018-04-04 LAB — RETICULOCYTES
Immature Retic Fract: 32.2 % — ABNORMAL HIGH (ref 2.3–15.9)
RBC.: 2.59 MIL/uL — AB (ref 4.22–5.81)
RETIC CT PCT: 2.1 % (ref 0.4–3.1)
Retic Count, Absolute: 55.4 10*3/uL (ref 19.0–186.0)

## 2018-04-04 LAB — PSA: Prostatic Specific Antigen: 568.06 ng/mL — ABNORMAL HIGH (ref 0.00–4.00)

## 2018-04-04 LAB — COMPREHENSIVE METABOLIC PANEL
ALT: 17 U/L (ref 0–44)
AST: 18 U/L (ref 15–41)
Albumin: 2.4 g/dL — ABNORMAL LOW (ref 3.5–5.0)
Alkaline Phosphatase: 37 U/L — ABNORMAL LOW (ref 38–126)
Anion gap: 9 (ref 5–15)
BUN: 40 mg/dL — AB (ref 8–23)
CHLORIDE: 103 mmol/L (ref 98–111)
CO2: 26 mmol/L (ref 22–32)
CREATININE: 1.01 mg/dL (ref 0.61–1.24)
Calcium: 8.6 mg/dL — ABNORMAL LOW (ref 8.9–10.3)
GFR calc non Af Amer: >60 mL/min (ref >60)
Glucose, Bld: 125 mg/dL — ABNORMAL HIGH (ref 70–99)
POTASSIUM: 5 mmol/L (ref 3.5–5.1)
SODIUM: 138 mmol/L (ref 135–145)
Total Bilirubin: 0.5 mg/dL (ref 0.3–1.2)
Total Protein: 5.9 g/dL — ABNORMAL LOW (ref 6.5–8.1)

## 2018-04-04 LAB — IRON AND TIBC
Iron: 83 ug/dL (ref 45–182)
Saturation Ratios: 37 % (ref 17.9–39.5)
TIBC: 225 ug/dL — ABNORMAL LOW (ref 250–450)
UIBC: 142 ug/dL

## 2018-04-04 LAB — PHOSPHORUS: PHOSPHORUS: 3.3 mg/dL (ref 2.5–4.6)

## 2018-04-04 LAB — EXPECTORATED SPUTUM ASSESSMENT W REFEX TO RESP CULTURE: SPECIAL REQUESTS: NORMAL

## 2018-04-04 LAB — PREPARE RBC (CROSSMATCH)

## 2018-04-04 LAB — FERRITIN: FERRITIN: 2192 ng/mL — AB (ref 24–336)

## 2018-04-04 LAB — MAGNESIUM: Magnesium: 2.4 mg/dL (ref 1.7–2.4)

## 2018-04-04 LAB — ABO/RH: ABO/RH(D): O NEG

## 2018-04-04 MED ORDER — FUROSEMIDE 10 MG/ML IJ SOLN: 20.0000 mg | Freq: Once | INTRAMUSCULAR | Status: DC

## 2018-04-04 MED ORDER — SODIUM CHLORIDE 0.9% IV SOLUTION: Freq: Once | INTRAVENOUS | Status: DC

## 2018-04-04 MED ORDER — METHYLPREDNISOLONE SODIUM SUCC 125 MG IJ SOLR
125.0000 mg | Freq: Every day | INTRAMUSCULAR | Status: DC
  Administered 2018-04-04: 125 mg via INTRAVENOUS
  Filled 2018-04-04: qty 2

## 2018-04-04 MED ORDER — PREDNISONE 20 MG PO TABS
20.0000 mg | ORAL_TABLET | Freq: Two times a day (BID) | ORAL | Status: DC
  Administered 2018-04-04 – 2018-04-06 (×4): 20 mg via ORAL
  Filled 2018-04-04 (×4): qty 1

## 2018-04-04 MED ORDER — PANTOPRAZOLE SODIUM 40 MG PO TBEC
40.0000 mg | DELAYED_RELEASE_TABLET | Freq: Every day | ORAL | Status: DC
  Administered 2018-04-04 – 2018-04-06 (×3): 40 mg via ORAL
  Filled 2018-04-04 (×3): qty 1

## 2018-04-04 MED ORDER — FUROSEMIDE 10 MG/ML IJ SOLN
20.0000 mg | Freq: Once | INTRAMUSCULAR | Status: DC
  Filled 2018-04-04: qty 2

## 2018-04-04 NOTE — Progress Notes (Signed)
NAME:  Alexander Ellis, MRN:  [id number], DOB:  04-29-1946, LOS: 4 ADMISSION DATE:  04/07/2018, CONSULTATION DATE:  04/01/18 REFERRING MD:  Raiford Noble, DO CHIEF COMPLAINT:  Acute on chronic hypoxemic respiratory failure  Brief History   72 year old male with history of prostate cancer with bone and metastasis, COPD, acute on chronic hypoxemic respiratory failure on 6 to 8 L of O2, chronic prednisone therapy, abdominal aortic aneurysm, hepatitis B and GERD who was admitted for COPD exacerbation. Developed respiratory distress and hypoxemia requiring rebreather. Critical care consulted for acute hypoxemic respiratory failure.  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, emphysema, GERD, abdominal aortic aneurysm  Significant Hospital Events   11/8 admitted COPD exacerbation secondary pneumonia 11/9 Transferred to ICU for acute hypoxemic respiratory failure 11/10 Started on BiPAP 11/11 on high flow oxygen, saturations 88-90 11/12 still requiring high FiO2-off BiPAP at present  Consults: date of consult/date signed off & final recs:  11/9 PCCM  Procedures (surgical and bedside):    Significant Diagnostic Tests:  CTA 11/8-no PE, intralobular septal thickening, small bilateral pleural effusions right perihilar airspace disease  Micro Data:  BCx 11/8>>>  Antimicrobials:  Vanc 11/8>>> Aztreonam 11/8>>> -The plan is to stop antibiotics today 04/04/2018  Subjective:  Reports severe shortness of breath even with turning He does desaturate significantly with any activity Complaining of a lot of anxiety although feels comfortable at the present time  Objective   Blood pressure 106/65, pulse (!) 120, temperature 97.6 F (36.4 C), temperature source Oral, resp. rate (!) 28, height 5\' 9"  (1.753 m), weight 55.7 kg, SpO2 91 %.    FiO2 (%):  [60 %-100 %] 100 %   Intake/Output Summary (Last 24 hours) at 04/04/2018 1004 Last data filed at 04/04/2018 0842 Gross per 24  hour  Intake 958.37 ml  Output -  Net 958.37 ml   Filed Weights   04/02/18 0500 04/03/18 0354 04/04/18 0400  Weight: 52.7 kg 54.5 kg 55.7 kg    Examination: General: Frail, does not appear to be in distress HENT: Dry oral mucosa Lungs: Occasional rhonchi, decreased breath sounds bilaterally Cardiovascular: S1-S2 appreciated Abdomen: Bowel sounds appreciated Extremities: Significant clubbing Neuro: Alert and oriented, moving all extremities  Assessment & Plan:   Acute on chronic hypoxemic respiratory failure -Patient does use 6 to 8 L at home at baseline -Required BiPAP during this hospitalization -He is on empiric antibiotics at the present time-has been 5 days with negative cultures -We will stop antibiotics today as cultures remains negative -We will follow  COPD exacerbation -FEV1 of 79%, diffusing capacity of 16% -Continue systemic steroids-did receive Solu-Medrol this morning, will change to prednisone 20 p.o. twice daily -We will complete antibiotics as above -Continue bronchodilators  Metastatic prostate cancer -Not a candidate for further chemotherapy -Palliative consult is appropriate -Was seen by Dr. Domingo Cocking  Goals of Care Patient is DNR Discussed with spouse and other family members at bedside It is unlikely that patient will be able to make it home in the short-term because of his significant oxygen requirement  CODE STATUS: DNR   Disposition / Summary of Today's Plan 04/04/18   Continue steroids Complete antibiotics-order written for it to be discussed at the end of today Continue bronchodilators  Evaluation for long-term acute care facility-consult social work    Diet: Advance as tolerated  DVT prophylaxis: Subcutaneous heparin GI prophylaxis: Pepcid Hyperglycemia protocol: SSI Mobility: Bedrest Code Status: Patient wishes to have CPR however no intubation.  Primary  team discussed with patient prior to transfer to ICU. Family Communication:  Wife at bedside today  Labs   CBC: Recent Labs  Lab 03/29/2018 1611 04/01/18 0431 04/02/18 0510 04/03/18 0353 04/04/18 0347  WBC 10.7* 8.2 10.9* 11.3* 11.2*  NEUTROABS 9.9*  --  9.8* 9.7* 9.7*  HGB 9.5* 8.2* 8.8* 9.0* 8.7*  HCT 31.2* 26.3* 28.2* 28.7* 28.0*  MCV 109.5* 106.0* 107.6* 109.1* 108.1*  PLT 226 205 224 234 428    Basic Metabolic Panel: Recent Labs  Lab 04/18/2018 1611 04/01/18 0431 04/02/18 0510 04/03/18 0353 04/04/18 0347  NA 138 133* 135 138 138  K 4.5 4.4 4.6 4.7 5.0  CL 103 102 100 105 103  CO2 26 25 25 24 26   GLUCOSE 140* 126* 127* 94 125*  BUN 21 21 31* 34* 40*  CREATININE 1.06 0.89 0.93 0.99 1.01  CALCIUM 8.3* 7.6* 8.3* 8.5* 8.6*  MG  --   --  2.3 2.4 2.4  PHOS  --   --  3.4 3.8 3.3   GFR: Estimated Creatinine Clearance: 50.6 mL/min (by C-G formula based on SCr of 1.01 mg/dL). Recent Labs  Lab 04/01/18 0431 04/01/18 1834 04/02/18 0510 04/03/18 0353 04/04/18 0347  PROCALCITON  --  <0.10 <0.10 <0.10  --   WBC 8.2  --  10.9* 11.3* 11.2*    Liver Function Tests: Recent Labs  Lab 04/04/2018 1611 04/01/18 0431 04/02/18 0510 04/03/18 0353 04/04/18 0347  AST 23 15 22 17 18   ALT 18 14 20 16 17   ALKPHOS 38 31* 36* 34* 37*  BILITOT 0.7 0.3 0.3 0.4 0.5  PROT 6.1* 5.3* 5.7* 5.7* 5.9*  ALBUMIN 2.5* 2.0* 2.2* 2.2* 2.4*   No results for input(s): LIPASE, AMYLASE in the last 168 hours. No results for input(s): AMMONIA in the last 168 hours.  ABG    Component Value Date/Time   PHART 7.501 (H) 04/01/2018 1800   PCO2ART 33.8 04/01/2018 1800   PO2ART 43.9 (L) 04/01/2018 1800   HCO3 26.2 04/01/2018 1800   TCO2 22 07/19/2014 1317   ACIDBASEDEF 0.2 03/14/2018 1300   O2SAT 78.4 04/01/2018 1800     Coagulation Profile: No results for input(s): INR, PROTIME in the last 168 hours.  Cardiac Enzymes: Recent Labs  Lab 04/09/2018 1611 04/01/18 1834 04/02/18 0057 04/02/18 0510  TROPONINI <0.03 <0.03 <0.03 <0.03    HbA1C: No results found for:  HGBA1C  CBG: Recent Labs  Lab 04/03/18 1148 04/03/18 1655 04/03/18 1925 04/03/18 2330 04/04/18 0321  GLUCAP 117* 118* 156* 151* 113*   Patient is still critically ill, acute on chronic respiratory failure Progressive lung disease Progressive metastatic cancer requiring high flow oxygen with significant risk of decompensation  Sherrilyn Rist, MD Cell: [phone]

## 2018-04-04 NOTE — Progress Notes (Signed)
Pt refuses bipap qhs as ordered.  Pt prefers to continue on hhfnc tonight.  Pt currently on 40L, 100% fio2.  HR 71, rr18, spo2 90%.  RN aware.  Bipap remains in room on standby if needed.

## 2018-04-04 NOTE — Progress Notes (Signed)
Unfortunately, Mr. Saxer really has not gotten much better.  He thinks he is doing better.  His family however says that not much is changed.  One thing I thought about was given him a transfusion.  His hemoglobin is 8.7.  I think that if we can get his hemoglobin up, this will improve his oxygen carrying ability and this may be able to get him off the CPAP.  I talked to he and his family about a transfusion.  I explained how it works.  I explained why I thought it would be helpful.  He is in agreement.  His PSA is now up to over 570.  This is quite a bit of a jump from his last level.  Unfortunately, I still think we can do a PET Axumin scan.  This is specific for prostate cancer.  I think this would be incredibly helpful to see what is going on with his lungs and if what we see on the CT scan is prostate cancer.  He is not eating.  He is only on some kind of liquid diet.  I would change him to a regular diet.  I would keep his CPAP off while he tries to eat.  I think this would be reasonable.  He does not complain of any pain.  He is on nebulizers.  He is on steroids.  I might increase his steroid dose a little bit.  If he has lymphangitic spread of malignancy, a higher dose of Solu-Medrol would be necessary.  Overall, I still think that the prognosis he is not can be all that great.  This is about the quality of life.  Hopefully, we will be able to get him off the CPAP.  Maybe the transfusion will help.  Lattie Haw, MD  Psalm [phone]

## 2018-04-04 NOTE — Progress Notes (Signed)
PMT progress note  Patient is awake alert, resting in bed. His wife and son are at the bedside. He appears to be in good spirits, he is off the BIPAP, tolerating PO diet.   BP (!) 100/56   Pulse 72   Temp 97.6 F (36.4 C) (Oral)   Resp 17   Ht 5\' 9"  (1.753 m)   Wt 55.7 kg   SpO2 90%   BMI 18.13 kg/m   Labs and imaging noted.   Appears frail and weak. Few scattered rhonchi S 1 S 2 Awake alert oriented.  Abdomen not distended.   Acute on chronic hypoxic resp failure.  COPD exacerbation.  Metastatic prostate cancer.  Bone mets.   Total 10 mg IV Morphine in the past 24 hours.   Discussed with son, wife and patient at bedside.   PPS 30%  Continue current mode of care.  Patient to get PRBC today.  Will continue goals of care discussions, based on hospital course and disease trajectory.   25 minutes spent.  Loistine Chance MD [facility] health palliative medicine team [phone]

## 2018-04-04 NOTE — Progress Notes (Signed)
Spoke with Dr. Alfredia Ferguson and the CCM physcian this am about PRBC's order.  Both doctors concur about holding the transfusion for now.  To repeat blood count this afternoon.  Robertlee Rogacki Roselie Awkward RN

## 2018-04-04 NOTE — Progress Notes (Signed)
Called the office in [facility] for Dr. Marin Olp. To let him know about the hold on PRBC's.  Left a message with the RN in the office about the hold of PRBC's.  Hayden Kihara Roselie Awkward RN

## 2018-04-04 NOTE — Progress Notes (Signed)
PROGRESS NOTE    Alexander Ellis  YFV:[id number] DOB: 21-Oct-1945 DOA: 04/16/2018 PCP: [name], NP   Brief Narrative:  HPI per Dr. Jossie Ng on 04/20/2018 Alexander Ellis is a 72 y.o. male with medical history significant of COPD, prostate cancer with bone metastasis, lung cancer, hepatitis B, GERD, abdominal aortic aneurysm, who was in the hospital until 2 weeks ago when he was discharged home.  He was admitted then with COPD exacerbation.  He is on 6 to 8 L of oxygen at home.  He had pneumonia also during that hospitalization.  Patient completed IV antibiotics and steroids.  He continues to take prednisone up until today.  In the last few days he has continues to have significant shortness of breath and wheezing.  Also cough which is productive of yellow sputum.  He has had significant exertional dyspnea and even while staying stable in bed.  No fever or chills no nausea vomiting no diarrhea.  Patient has had chest discomfort with the cough.  He has tried his home breathing treatment with no help.  In the ER he was noted to have oxygen sats of 80% on 8 L initially.  Patient given breathing treatments and is being admitted for pneumonia that was found in his right mid lobe.  ED Course: Temperature 96.8 blood pressure 140/68 pulse 79 respirate 25 oxygen sat 98% room air.  White count is 10.7 hemoglobin 9.5 and platelet 226.  Sodium 138 potassium 4.5 chloride 103 CO2 26 BUN 21 creatinine 1.06 and calcium 8.3.  Glucose 140.  Chest x-ray showed increased opacity in the right midlung presumably pneumonia with background of severe emphysema.  CT angiogram of the chest showed no evidence of PE mainly diffuse increase in findings of pneumonia and some pulmonary edema  **Patient decompensated during hosptialization and became very hypoxic and short of breath on his home regimen of 8 L.  Desaturated on high 60s and was transferred to the stepdown unit for further evaluation and monitoring placed on heated high  flow nasal cannula.  PCCM physician Dr. Rodman Pickle was consulted for further evaluation recommendations given his worsening hypoxia.  Overnight on 11/9-11/10 the patient decompensated again and had to be placed on BiPAP.  Palliative care medicine was called for further evaluation recommendations given his worsening condition and high risk for further decompensation but wife very disinterested in [place].  Dr. Loanne Drilling had a conversation with the patient and the patient's family and after lengthy discussion his CODE STATUS was changed to DNR.  Oncology Dr. Marin Olp consulted for further evaluation and he thinks that a PET scan would be needed but only can be done as outpatient.  Oncology recommending transfusing 2 units of PRBCs however I do not feel that this is necessary in this patient and may cause more volume overload and so I have held transfusion at this time.  Assessment & Plan:   Principal Problem:   Pneumonia Active Problems:   AAA (abdominal aortic aneurysm) (HCC)   Chronic hepatitis B (HCC)   Prostate cancer metastatic to bone (HCC)   DOE (dyspnea on exertion)   Protein-calorie malnutrition, severe   Right middle lobe pulmonary infiltrate   SOB (shortness of breath)  Multifactorial acute on chronic respiratory failure with hypoxia in the setting of severe emphysematous COPD along with metastatic prostate cancer with mets to the lungs and volume overload from suspected CHF and HCAP pneumonia; requiring noninvasive positive pressure ventilation with BiPAP and now oh Hi-Flow Nasal Cannula -  Patient normally wears 6-8 L at home and became hypoxic on 8 L today and had to be placed on a nonrebreather and transferred to the stepdown unit for further monitoring and evaluation; had to be placed on BiPAP but now back on heated high flow nasal cannula -PCCM/pulmonary consulted for further recommendations -CTA of the chest showed no PE (central/lobar/proximal segmental) PE but did show  interval diffuse increase in intralobular septal thickening and small bilateral pleural effusion compatible with pulmonary edema.  There is also mildly improved right perihilar consolidations which may be related to a post obstructive pneumonitis or pneumonia.  Patient did have increase in size of pulmonary metastasis and extensive mediastinal and hilar adenopathy along with grossly stable bony diffuse metastatic disease -Patient is now DNR -Continue with duo nebs 3 mL 3 times daily along with 3 mL's every 4 PRN for wheezing or shortness breath -Continue with budesonide 0.25 mg nebulized twice daily -Continue with guaifenesin 1200 mg p.o. twice daily-given Solu-Cortef 200 mg IV once yesterday and I discussed with PCCM and they will be changing the patient to Methylprednisolone 40 mg Daily for 5 days total and then changing back to chronic 20 mg daily this was increased by oncology 225 mg daily today -Check echocardiogram and this was currently done and showed Normal and vigorus Systolic Fxn with EF of 93-79% and Grade 1 Diastolic CHF was doppler parameters were consistent with abnormal LV Relaxation  -Diuresis currently being held by pulmonary and agree not to give any more -Patient takes 20 mg of prednisone daily breakfast however this was changed to IV Solu-Medrol 40 mg daily by pulmonary this was increased to 125 mg today by oncology -Broad-spectrum antibiotics with Azactam due to penicillin allergy and vancomycin and will continue for total 5 days total and antibiotics are to stop after today. -Continue monitor respiratory status very carefully and maintain O2 saturations greater than 88% -Repeat chest x-ray in the a.m. -Swallow screen canceled and patient is now eating regular diet -Procalcitonin level was less than 0.10 back in 03/16/2018  And repeat again was <0.10 -Outpatient pulmonary notes revealed that they were in the process of increasing his oxygen requirements with a home concentrator  with 10 L -Recently saw pulmonary 03/28/2018 hospital to be brought back within a week to see Dr. Melvyn Novas to adjust his oxygen settings but unfortunately got hospitalized -Continue to monitor volume status extremely carefully worsening respiratory status with respirations in the 40s yesterday morning -Palliative care consulted for further goals of care discussion and patient states that he feels at times he was suffering but does not want to be comfort care and wife did not want palliative care to come back.  Patient and family declined low-dose morphine for breathing then extubated and he is only used 10 mg in the last 24 hours -Oncology ordered 2 units to be transfused again that they can improve his hemoglobin his oxygen carrying capacity may improve and he may be will wean off of BiPAP; he was weaned off of BiPAP nonetheless and I have held the 2 units to be transfused as this may worsen the patient's condition by volume overload and critical care is in agreement with me -Chest x-ray appears stable from prior study and shows No interval change. Underlying interstitial fibrosis with superimposed diffuse bilateral airspace disease which could be infection or edema.   Metastatic prostate cancer to the lungs as well as a bone -Is post 2 chemotherapy cycles with Taxotere 3 (D/C'D) -Last PSA had gone  up to 244 but now is 63 -Will notify Dr. Marin Olp via Epic; Dr. Marin Olp saw the patient and thinks that he did not have a lot of progressive disease and would likely want to do a PET Axumin scan however cannot be done inpatient.  Dr. Marin Olp thinks this could be lymphangitic spread and is obtaining a PSA which was 587;  -Patient's oncologist recommended comfort care and he is no longer candidate for systemic chemotherapy as his ECOG score is not good enough and Dr. Marin Olp felt like he had less than 20% chance of helping with systemic therapy -Continue pain control with hydrocodone-acetaminophen seven-point  5-325 mg 1 to 2 tablets every 4 hours PRN for moderate pain pain with ondansetron 8 mg p.o. twice daily for refractory nausea or vomiting along with prochlorperazine 10 mg p.o. every 6 hours as needed for nausea vomiting -Palliative Care Consulted for [place] Discussion; I have added low-dose morphine for his breathing -Patient is now DNR  Neovascular Glaucoma -Continue with dorzolamide-timolol 1 drop in the left eye twice daily and prednisolone acetate 1 drop to left eye 4 times daily  GERD -Continue with Famotidine 20 mg p.o. twice daily  Pulmonary Edema  2/2 to Acute Grade 1 Diastolic CHF -BNP was not very elevated however chest CT scan showed pulmonary edema -Diuresis as able but now held due to Pulmonary Recc's and will not be given any further diuresis currently. -Strict I's and O's and daily weights -Stopped IV fluid hydration  -Patient is positive 264 mL since admission -Wet is up 3 pound since admission -Continue to Monitor Volume status carefully  Long-term tobacco user -Recently quit in July -Congratulated the patient on stopping smoking  Chronic hepatitis B virus infection -Stable and at baseline  Liver cirrhosis -May need abdominal ultrasound but will hold -LFTs are normal -Did not appear to have ascites on clinical examination  Leukocytosis -WBC went from 10.7 -> 8.2 -> 10.9 -> 11.3 -> 11.8 -? In the setting of IV Steroid Demargination -Continue monitor for signs and symptoms of infection -Continue with antibiotic coverage -Repeat CBC in AM   Macrocytic Anemia -Hemoglobin/hematocrit went from 9.5/31.2 and is now stable at 8.7/28.4 -Check Anemia Panel and it showed an iron level of 85, TIBC of 132, TIBC of 217, saturation ratios of 39%, ferritin level 1921, folate level 5.7, and vitamin B12 level 400 -Neurology ordered 2 units of PRBCs to be transfused however I have canceled these because patient is not currently bleeding and I think giving him blood will cause him  to become more volume overloaded even with Lasix.  Critical care is in agreement with me and will hold off currently -Continue to monitor for signs and symptoms of bleeding -Repeat CBC in a.m.  Severe protein calorie malnutrition -nutrition is consulted for further evaluation recommendations -Patient was started on Ensure and live p.o. 3 times daily along with a daily multivitamin and was encouraged to take p.o. intake was diet was advanced  DVT prophylaxis: Heparin 5,000 units sq q8h Code Status: DO NOT RESUSCITATE Family Communication: Discussed with Wife at bedside  Disposition Plan: Pending further [place] Conversations  Consultants:   PCCM/Pulmonary  Palliative Care Medicine   Medical Oncology   Procedures:  ECHOCARDIOGRAM ------------------------------------------------------------------- Study Conclusions  - Left ventricle: The cavity size was normal. Wall thickness was   increased in a pattern of mild LVH. Systolic function was   vigorous. The estimated ejection fraction was in the range of 65%   to 70%. Wall motion was  normal; there were no regional wall   motion abnormalities. Doppler parameters are consistent with   abnormal left ventricular relaxation (grade 1 diastolic   dysfunction). - Aortic valve: Moderately to severely calcified annulus.   Trileaflet; mildly thickened leaflets. - Mitral valve: Mildly thickened leaflets . - Left atrium: The atrium was mildly dilated. - Atrial septum: No defect or patent foramen ovale was identified. - Tricuspid valve: There was mild regurgitation.   Antimicrobials:  Anti-infectives (From admission, onward)   Start     Dose/Rate Route Frequency Ordered Stop   04/01/18 2200  vancomycin (VANCOCIN) IVPB 750 mg/150 ml premix     750 mg 150 mL/hr over 60 Minutes Intravenous Every 24 hours 04/04/2018 2116 04/04/18 2359   04/01/18 0000  aztreonam (AZACTAM) 2 g in sodium chloride 0.9 % 100 mL IVPB     2 g 200 mL/hr over 30 Minutes  Intravenous Every 8 hours 04/14/2018 2011 04/04/18 1552   04/15/2018 1745  vancomycin (VANCOCIN) IVPB 1000 mg/200 mL premix     1,000 mg 200 mL/hr over 60 Minutes Intravenous  Once 04/18/2018 1734 04/17/2018 2221   04/18/2018 1730  aztreonam (AZACTAM) 2 g in sodium chloride 0.9 % 100 mL IVPB     2 g 200 mL/hr over 30 Minutes Intravenous  Once 04/18/2018 1725 03/26/2018 1823     Subjective: Seen and examined and and was off of BiPAP and on heated high flow and states that he is not as anxious and the lorazepam helped.  States he is breathing of normal but better today but is stable.  No chest pain or lightheadedness or dizziness.  No other concerns or complaints at this time.  Objective: Vitals:   04/04/18 1300 04/04/18 1400 04/04/18 1600 04/04/18 1800  BP: (!) 97/52 (!) 94/55 (!) 100/56 (!) 151/52  Pulse: 92 70 72 88  Resp: 16 16 17 20   Temp:   97.6 F (36.4 C)   TempSrc:   Oral   SpO2: (!) 88% 91% 90% (!) 88%  Weight:      Height:        Intake/Output Summary (Last 24 hours) at 04/04/2018 1935 Last data filed at 04/04/2018 1600 Gross per 24 hour  Intake 1183.43 ml  Output 750 ml  Net 433.43 ml   Filed Weights   04/02/18 0500 04/03/18 0354 04/04/18 0400  Weight: 52.7 kg 54.5 kg 55.7 kg   Examination: Physical Exam:  Constitutional: Thin, frail, cachectic and chronically ill-appearing Caucasian male currently in some respiratory distress appears as anxious today and more comfortable Eyes: Sclera is anicteric.  Lids and conjunctive are normal ENMT: External ears and nose appear normal.  Extremely poor dentition Neck: Appears supple no JVD Respiratory: Severely diminished bilaterally with coarse breath sounds and rhonchi noted in the right compared to left.  Has some accessory muscle usage but is now on high flow nasal cannula appears more comfortable now Cardiovascular: Tachycardic rate but regular rhythm.  No lower extremity edema noted Abdomen: Soft, nontender, nondistended.  Bowel  sounds present all 4 quadrants GU: Deferred  Musculoskeletal: Contractures or cyanosis.  No joint deformity noted Skin: Skin is warm and dry.  No appreciable rashes or lesions on limited skin evaluation Neurologic: Cranial nerves II through XII grossly intact no appreciable focal deficit Psychiatric: Not as anxious today.  Intact judgment insight.  Patient is awake and alert and oriented  Data Reviewed: I have personally reviewed following labs and imaging studies  CBC: Recent Labs  Lab 04/05/2018 1611  04/01/18 0431 04/02/18 0510 04/03/18 0353 04/04/18 0347 04/04/18 1519  WBC 10.7* 8.2 10.9* 11.3* 11.2* 11.8*  NEUTROABS 9.9*  --  9.8* 9.7* 9.7* 10.8*  HGB 9.5* 8.2* 8.8* 9.0* 8.7* 8.7*  HCT 31.2* 26.3* 28.2* 28.7* 28.0* 28.4*  MCV 109.5* 106.0* 107.6* 109.1* 108.1* 109.7*  PLT 226 205 224 234 236 782   Basic Metabolic Panel: Recent Labs  Lab 03/28/2018 1611 04/01/18 0431 04/02/18 0510 04/03/18 0353 04/04/18 0347  NA 138 133* 135 138 138  K 4.5 4.4 4.6 4.7 5.0  CL 103 102 100 105 103  CO2 26 25 25 24 26   GLUCOSE 140* 126* 127* 94 125*  BUN 21 21 31* 34* 40*  CREATININE 1.06 0.89 0.93 0.99 1.01  CALCIUM 8.3* 7.6* 8.3* 8.5* 8.6*  MG  --   --  2.3 2.4 2.4  PHOS  --   --  3.4 3.8 3.3   GFR: Estimated Creatinine Clearance: 50.6 mL/min (by C-G formula based on SCr of 1.01 mg/dL). Liver Function Tests: Recent Labs  Lab 04/22/2018 1611 04/01/18 0431 04/02/18 0510 04/03/18 0353 04/04/18 0347  AST 23 15 22 17 18   ALT 18 14 20 16 17   ALKPHOS 38 31* 36* 34* 37*  BILITOT 0.7 0.3 0.3 0.4 0.5  PROT 6.1* 5.3* 5.7* 5.7* 5.9*  ALBUMIN 2.5* 2.0* 2.2* 2.2* 2.4*   No results for input(s): LIPASE, AMYLASE in the last 168 hours. No results for input(s): AMMONIA in the last 168 hours. Coagulation Profile: No results for input(s): INR, PROTIME in the last 168 hours. Cardiac Enzymes: Recent Labs  Lab 04/19/2018 1611 04/01/18 1834 04/02/18 0057 04/02/18 0510  TROPONINI <0.03 <0.03  <0.03 <0.03   BNP (last 3 results) No results for input(s): PROBNP in the last 8760 hours. HbA1C: No results for input(s): HGBA1C in the last 72 hours. CBG: Recent Labs  Lab 04/03/18 1148 04/03/18 1655 04/03/18 1925 04/03/18 2330 04/04/18 0321  GLUCAP 117* 118* 156* 151* 113*   Lipid Profile: No results for input(s): CHOL, HDL, LDLCALC, TRIG, CHOLHDL, LDLDIRECT in the last 72 hours. Thyroid Function Tests: No results for input(s): TSH, T4TOTAL, FREET4, T3FREE, THYROIDAB in the last 72 hours. Anemia Panel: Recent Labs    04/03/18 0353 04/04/18 0347  VITAMINB12 400  --   FOLATE 5.7*  --   FERRITIN 1,921* 2,192*  TIBC 217* 225*  IRON 85 83  RETICCTPCT 2.0 2.1   Sepsis Labs: Recent Labs  Lab 04/01/18 1834 04/02/18 0510 04/03/18 0353  PROCALCITON <0.10 <0.10 <0.10    Recent Results (from the past 240 hour(s))  Culture, blood (routine x 2) Call MD if unable to obtain prior to antibiotics being given     Status: None (Preliminary result)   Collection Time: 04/02/2018  8:28 PM  Result Value Ref Range Status   Specimen Description   Final    BLOOD LEFT Performed at [facility], [place] [address], [place], [place] [address]    Special Requests   Final    BOTTLES DRAWN AEROBIC ONLY Blood Culture adequate volume Performed at [facility] [address], [place], [place] [address]    Culture   Final    NO GROWTH 4 DAYS Performed at [facility], [place] [address], [place], [place] [address]    Report Status PENDING  Incomplete  Culture, blood (routine x 2) Call MD if unable to obtain prior to antibiotics being given     Status: None (Preliminary result)  Collection Time: 04/18/2018  8:29 PM  Result Value Ref Range Status   Specimen Description   Final    BLOOD LEFT Performed at [facility] [address], [place], [place] [address]    Special Requests   Final    BOTTLES DRAWN AEROBIC ONLY Blood Culture  adequate volume Performed at [facility] [address] [place], [place] [address]    Culture   Final    NO GROWTH 4 DAYS Performed at [facility], [place] [address] [place], [place] [address]    Report Status PENDING  Incomplete  Expectorated sputum assessment w rflx to resp cult     Status: None   Collection Time: 04/01/18  5:57 PM  Result Value Ref Range Status   Specimen Description SPUTUM  Final   Special Requests NONE  Final   Sputum evaluation   Final    Sputum specimen not acceptable for testing.  Please recollect.   NOTIFIED ARNOLD,A RN @1909  ON 04/01/18 JACKSON,K Performed at [facility], [place] [address] [place], [place] [address]    Report Status 04/01/2018 FINAL  Final  MRSA PCR Screening     Status: None   Collection Time: 04/01/18  6:40 PM  Result Value Ref Range Status   MRSA by PCR NEGATIVE NEGATIVE Final    Comment:        The GeneXpert MRSA Assay (FDA approved for NASAL specimens only), is one component of a comprehensive MRSA colonization surveillance program. It is not intended to diagnose MRSA infection nor to guide or monitor treatment for MRSA infections. Performed at [facility], [place] [address], [place], [place] [address]   Expectorated sputum assessment w rflx to resp cult     Status: None   Collection Time: 04/04/18  8:56 AM  Result Value Ref Range Status   Specimen Description SPUTUM  Final   Special Requests Normal  Final   Sputum evaluation   Final    THIS SPECIMEN IS ACCEPTABLE FOR SPUTUM CULTURE Performed at [facility], [place] [address], [place], [place] [address]    Report Status 04/04/2018 FINAL  Final  Culture, respiratory     Status: None (Preliminary result)   Collection Time: 04/04/18  8:56 AM  Result Value Ref Range Status   Specimen Description   Final    SPUTUM Performed at [facility] [address] [place], [place]  [address]    Special Requests   Final    Normal Reflexed from [id number] Performed at [facility], [place] [address] [place], [place] [address]    Gram Stain   Final    RARE WBC PRESENT, PREDOMINANTLY PMN RARE GRAM POSITIVE COCCI Performed at [facility], [place] [address] [place], [place] [address]    Culture PENDING  Incomplete   Report Status PENDING  Incomplete    Radiology Studies: Dg Chest Port 1 View  Result Date: 04/04/2018 CLINICAL DATA:  Short of breath EXAM: PORTABLE CHEST 1 VIEW COMPARISON:  04/03/2018 FINDINGS: Underlying interstitial fibrosis. Superimposed bilateral airspace disease unchanged. No effusion. Port-A-Cath tip in the SVC unchanged. Prominent right hilum unchanged consistent with adenopathy. IMPRESSION: No interval change. Underlying interstitial fibrosis with superimposed diffuse bilateral airspace disease which could be infection or edema. Electronically Signed   By: Franchot Gallo M.D.   On: 04/04/2018 07:41   Dg Chest Port 1 View  Result Date: 04/03/2018 CLINICAL DATA:  Shortness of  breath EXAM: PORTABLE CHEST 1 VIEW COMPARISON:  03/29/2018 FINDINGS: Cardiac shadow is stable. Right chest wall port is again seen. Multiple left rib fractures are again noted. Stable chronic emphysematous changes are noted. Some mild vascular congestion and patchy infiltrative opacities are again identified bilaterally relatively stable from the prior exam. Portion of these are related to the known underlying metastatic disease. No acute bony abnormality is noted. IMPRESSION: Stable appearance of the chest when compared with the prior study. Electronically Signed   By: Inez Catalina M.D.   On: 04/03/2018 07:06   Scheduled Meds: . sodium chloride   Intravenous Once  . acidophilus  1 capsule Oral Daily  . arformoterol  15 mcg Nebulization BID  . budesonide (PULMICORT) nebulizer solution  0.25 mg Nebulization BID  . chlorhexidine  15 mL Mouth/Throat BID  .  chlorhexidine  15 mL Mouth Rinse BID  . Chlorhexidine Gluconate Cloth  6 each Topical Q0600  . dorzolamide-timolol  1 drop Left Eye BID  . famotidine  20 mg Oral BID  . feeding supplement (ENSURE ENLIVE)  237 mL Oral TID BM  . furosemide  20 mg Intravenous Once  . furosemide  20 mg Intravenous Once  . guaiFENesin  1,200 mg Oral BID  . heparin  5,000 Units Subcutaneous Q8H  . ipratropium-albuterol  3 mL Nebulization TID  . mouth rinse  15 mL Mouth Rinse q12n4p  . multivitamin with minerals  1 tablet Oral Daily  . pantoprazole  40 mg Oral Daily  . prednisoLONE acetate  1 drop Left Eye QID  . predniSONE  20 mg Oral BID WC   Continuous Infusions: . vancomycin Stopped (04/03/18 2330)    LOS: 4 days   Kerney Elbe, DO [facility] PAGER is on [place]  If 7PM-7AM, please contact night-coverage [email or web address] Password [facility] 04/04/2018, 7:35 PM

## 2018-04-05 DIAGNOSIS — E43 Unspecified severe protein-calorie malnutrition: Secondary | ICD-10-CM

## 2018-04-05 LAB — CBC WITH DIFFERENTIAL/PLATELET
Abs Immature Granulocytes: 0.4 10*3/uL — ABNORMAL HIGH (ref 0.00–0.07)
Basophils Absolute: 0 10*3/uL (ref 0.0–0.1)
Basophils Relative: 0 %
EOS ABS: 0 10*3/uL (ref 0.0–0.5)
EOS PCT: 0 %
HEMATOCRIT: 28.7 % — AB (ref 39.0–52.0)
Hemoglobin: 9 g/dL — ABNORMAL LOW (ref 13.0–17.0)
Immature Granulocytes: 3 %
LYMPHS ABS: 0.6 10*3/uL — AB (ref 0.7–4.0)
Lymphocytes Relative: 5 %
MCH: 34.1 pg — AB (ref 26.0–34.0)
MCHC: 31.4 g/dL (ref 30.0–36.0)
MCV: 108.7 fL — AB (ref 80.0–100.0)
MONO ABS: 0.6 10*3/uL (ref 0.1–1.0)
Monocytes Relative: 5 %
Neutro Abs: 11.1 10*3/uL — ABNORMAL HIGH (ref 1.7–7.7)
Neutrophils Relative %: 87 %
Platelets: 249 10*3/uL (ref 150–400)
RBC: 2.64 MIL/uL — ABNORMAL LOW (ref 4.22–5.81)
RDW: 15.6 % — AB (ref 11.5–15.5)
WBC: 12.7 10*3/uL — AB (ref 4.0–10.5)
nRBC: 0.2 % (ref 0.0–0.2)

## 2018-04-05 LAB — COMPREHENSIVE METABOLIC PANEL
ALT: 16 U/L (ref 0–44)
ANION GAP: 9 (ref 5–15)
AST: 19 U/L (ref 15–41)
Albumin: 2.4 g/dL — ABNORMAL LOW (ref 3.5–5.0)
Alkaline Phosphatase: 38 U/L (ref 38–126)
BILIRUBIN TOTAL: 0.5 mg/dL (ref 0.3–1.2)
BUN: 44 mg/dL — ABNORMAL HIGH (ref 8–23)
CO2: 28 mmol/L (ref 22–32)
Calcium: 8.8 mg/dL — ABNORMAL LOW (ref 8.9–10.3)
Chloride: 101 mmol/L (ref 98–111)
Creatinine, Ser: 1.02 mg/dL (ref 0.61–1.24)
Glucose, Bld: 126 mg/dL — ABNORMAL HIGH (ref 70–99)
POTASSIUM: 4.6 mmol/L (ref 3.5–5.1)
Sodium: 138 mmol/L (ref 135–145)
TOTAL PROTEIN: 5.9 g/dL — AB (ref 6.5–8.1)

## 2018-04-05 LAB — SAVE SMEAR (SSMR)

## 2018-04-05 LAB — CULTURE, BLOOD (ROUTINE X 2)
Culture: NO GROWTH
Culture: NO GROWTH
Special Requests: ADEQUATE
Special Requests: ADEQUATE

## 2018-04-05 MED ORDER — NITROGLYCERIN 0.4 MG SL SUBL
SUBLINGUAL_TABLET | SUBLINGUAL | Status: AC
  Filled 2018-04-05: qty 1

## 2018-04-05 MED ORDER — FENTANYL 12 MCG/HR TD PT72
12.5000 ug | MEDICATED_PATCH | TRANSDERMAL | Status: DC
  Administered 2018-04-05: 12.5 ug via TRANSDERMAL
  Filled 2018-04-05: qty 1

## 2018-04-05 NOTE — Progress Notes (Signed)
Alexander Ellis still is having some panic issues.  The BiPAP is off.  However, he seems to desaturate.  His PSA is quite high.  As such, I have to worry that it might be the prostate cancer that is in his lungs causing problems.  His hemoglobin is 9.  I really think this is too low for him.  He may not be bleeding but I doubt that he is making red cells.  I looked at his blood under the microscope.  He has changes that look like there is marrow infiltration by prostate cancer.  As such, I really think that transfusing him would help.  I do not think this would be bad for his heart since his cardiac ejection fraction is adequate.  I would also try him on a Duragesic patch.  This may help with his breathing a little bit.  This may help with some bronchodilation.  I may also help relax him a little bit.  We are clearly in a situation where quality of life is the goal.  I truly believe that transfusing him will give him a much better chance of improving his pulmonary status.  The chest x-ray yesterday showed underlying interstitial fibrosis with superimposed bilateral airspace disease.  I doubt that the airspace disease is edema.  He is on antibiotics for the possible pneumonia.  He is not a candidate for any chemotherapy for the prostate cancer.  I know that the staff down in the ICU are doing a great job with him.  We will continue to pray for him and his family.  Alexander Haw, MD  1 John 1:7

## 2018-04-05 NOTE — Progress Notes (Signed)
NAME:  Alexander Ellis, MRN:  [id number], DOB:  25-Jan-1946, LOS: 5 ADMISSION DATE:  04/02/2018, CONSULTATION DATE:  04/01/18 REFERRING MD:  Raiford Noble, DO CHIEF COMPLAINT:  Acute on chronic hypoxemic respiratory failure  Brief History   72 year old male with history of prostate cancer with bone and metastasis, COPD, acute on chronic hypoxemic respiratory failure on 6 to 8 L of O2, chronic prednisone therapy, abdominal aortic aneurysm, hepatitis B and GERD who was admitted for COPD exacerbation. Developed respiratory distress and hypoxemia requiring rebreather. Critical care consulted for acute hypoxemic respiratory failure.  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, emphysema, GERD, abdominal aortic aneurysm  Significant Hospital Events   11/8 admitted COPD exacerbation secondary pneumonia 11/9 Transferred to ICU for acute hypoxemic respiratory failure 11/10 Started on BiPAP 11/11 on high flow oxygen, saturations 88-90 11/12 still requiring high FiO2-off BiPAP at present  Consults: date of consult/date signed off & final recs:  11/9 PCCM  Procedures (surgical and bedside):    Significant Diagnostic Tests:  CTA 11/8-no PE, intralobular septal thickening, small bilateral pleural effusions right perihilar airspace disease  Micro Data:  BCx 11/8>>>  Antimicrobials:  Vanc 11/8>>> Aztreonam 11/8>>> -The plan is to stop antibiotics today 04/04/2018  Subjective:  Severe SOB and desat with motion No complaints of pain at this point  Objective   Blood pressure (!) 102/59, pulse 68, temperature 98 F (36.7 C), temperature source Oral, resp. rate 11, height 5\' 9"  (1.753 m), weight 55.7 kg, SpO2 93 %.    FiO2 (%):  [100 %] 100 %   Intake/Output Summary (Last 24 hours) at 04/05/2018 0906 Last data filed at 04/04/2018 1600 Gross per 24 hour  Intake 540 ml  Output 750 ml  Net -210 ml   Filed Weights   04/02/18 0500 04/03/18 0354 04/04/18 0400  Weight:  52.7 kg 54.5 kg 55.7 kg   Examination: General: Frail elderly male, emaciated, mild respiratory distress HENT: [place]/AT, PERRL, EOM-I and DMM Lungs: Upper airway wheezing noted Cardiovascular: RRR, Nl S1/S2 and -M/R/G Abdomen: Soft, NT, ND and +BS Extremities: Significant clubbing, -edema Neuro: Alert and oriented, moving all ext to command  Assessment & Plan:   Acute on chronic hypoxemic respiratory failure - Patient does use 6 to 8 L at home at baseline - Currently on HFNC at 100% with 40 L/min driver - BiPAP for comfort at this point - Currently off abx and holding  COPD exacerbation - FEV1 of 79%, diffusing capacity of 16% - Continue systemic steroids-did receive Solu-Medrol this morning, changed to prednisone 20 p.o. twice daily - Completed course of abx - BD as ordered  Metastatic prostate cancer - Not a candidate for further chemotherapy at this point - Appreciate input from palliative care  Goals of Care Patient is DNR confirmed bedside It is unlikely that patient will be able to make it home in the short-term because of his significant oxygen requirement  CODE STATUS: DNR   Disposition / Summary of Today's Plan 04/05/18   Maintain in the ICU, hold blood transfusion given concern for volume and respiratory failure on max support with Hg of 9    Diet: Advance as tolerated  DVT prophylaxis: Subcutaneous heparin GI prophylaxis: Pepcid Hyperglycemia protocol: SSI Mobility: Bedrest Code Status: Patient wishes to have CPR however no intubation.  Primary team discussed with patient prior to transfer to ICU. Family Communication: Wife at bedside today  Labs   CBC: Recent Labs  Lab 04/02/18 0510 04/03/18  2025 04/04/18 0347 04/04/18 1519 04/05/18 0404  WBC 10.9* 11.3* 11.2* 11.8* 12.7*  NEUTROABS 9.8* 9.7* 9.7* 10.8* 11.1*  HGB 8.8* 9.0* 8.7* 8.7* 9.0*  HCT 28.2* 28.7* 28.0* 28.4* 28.7*  MCV 107.6* 109.1* 108.1* 109.7* 108.7*  PLT 224 234 236 234 249     Basic Metabolic Panel: Recent Labs  Lab 04/01/18 0431 04/02/18 0510 04/03/18 0353 04/04/18 0347 04/05/18 0404  NA 133* 135 138 138 138  K 4.4 4.6 4.7 5.0 4.6  CL 102 100 105 103 101  CO2 25 25 24 26 28   GLUCOSE 126* 127* 94 125* 126*  BUN 21 31* 34* 40* 44*  CREATININE 0.89 0.93 0.99 1.01 1.02  CALCIUM 7.6* 8.3* 8.5* 8.6* 8.8*  MG  --  2.3 2.4 2.4  --   PHOS  --  3.4 3.8 3.3  --    GFR: Estimated Creatinine Clearance: 50.1 mL/min (by C-G formula based on SCr of 1.02 mg/dL). Recent Labs  Lab 04/01/18 1834 04/02/18 0510 04/03/18 0353 04/04/18 0347 04/04/18 1519 04/05/18 0404  PROCALCITON <0.10 <0.10 <0.10  --   --   --   WBC  --  10.9* 11.3* 11.2* 11.8* 12.7*    Liver Function Tests: Recent Labs  Lab 04/01/18 0431 04/02/18 0510 04/03/18 0353 04/04/18 0347 04/05/18 0404  AST 15 22 17 18 19   ALT 14 20 16 17 16   ALKPHOS 31* 36* 34* 37* 38  BILITOT 0.3 0.3 0.4 0.5 0.5  PROT 5.3* 5.7* 5.7* 5.9* 5.9*  ALBUMIN 2.0* 2.2* 2.2* 2.4* 2.4*   No results for input(s): LIPASE, AMYLASE in the last 168 hours. No results for input(s): AMMONIA in the last 168 hours.  ABG    Component Value Date/Time   PHART 7.501 (H) 04/01/2018 1800   PCO2ART 33.8 04/01/2018 1800   PO2ART 43.9 (L) 04/01/2018 1800   HCO3 26.2 04/01/2018 1800   TCO2 22 07/19/2014 1317   ACIDBASEDEF 0.2 03/14/2018 1300   O2SAT 78.4 04/01/2018 1800     Coagulation Profile: No results for input(s): INR, PROTIME in the last 168 hours.  Cardiac Enzymes: Recent Labs  Lab 04/22/2018 1611 04/01/18 1834 04/02/18 0057 04/02/18 0510  TROPONINI <0.03 <0.03 <0.03 <0.03    HbA1C: No results found for: HGBA1C  CBG: Recent Labs  Lab 04/03/18 1148 04/03/18 1655 04/03/18 1925 04/03/18 2330 04/04/18 0321  GLUCAP 117* 118* 156* 151* 113*   The patient is critically ill with multiple organ systems failure and requires high complexity decision making for assessment and support, frequent evaluation and  titration of therapies, application of advanced monitoring technologies and extensive interpretation of multiple databases.   Critical Care Time devoted to patient care services described in this note is  33  Minutes. This time reflects time of care of this signee Dr Jennet Maduro. This critical care time does not reflect procedure time, or teaching time or supervisory time of PA/NP/Med student/Med Resident etc but could involve care discussion time.  Rush Farmer, M.D. [facility] Pulmonary/Critical Care Medicine. Pager: [phone]. After hours pager: [phone].

## 2018-04-05 NOTE — Progress Notes (Signed)
PROGRESS NOTE                                                                                                                                                                                                             Patient Demographics:    Alexander Ellis, is a 72 y.o. male, DOB - 1946-01-05, YHC:[id number]  Admit date - 03/28/2018   Admitting Physician Elwyn Reach, MD  Outpatient Primary MD for the patient is [name], NP  LOS - 5  Outpatient Specialists: Oncology (Dr. Marin Olp)  Chief Complaint  Patient presents with  . 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  Patient managed in stepdown unit with high flow nasal cannula (initially required BiPAP)   Subjective:   Reports having panic attacks during the night.  Was on BiPAP for about an hour and then on high flow nasal cannula.  Still feels short of breath.   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 Required BiPAP for several days, IV steroid and antibiotic.  Now on high flow nasal cannula.  BiPAP only for comfort. On stepdown monitoring. PCCM following.  CT angiogram of the chest was negative for PE. Continue scheduled and PRN nebs, Pulmicort nebs twice daily.  Switch IV Solu-Medrol to prednisone.  And antitussives. Added fentanyl patch by oncologist for his dyspnea and anxiety. Oncology recommended transfusion for dyspnea but both me and palliative care feel it is not going to improve his symptoms  so it has been discontinued.  Palliative care following.  Overall prognosis is guarded.  Active problems Healthcare associated pneumonia ([place]) Completed 5-day course of antibiotics  Metastatic prostate cancer to the bone and the lungs. Received 2 cycles of chemotherapy with Taxotere.  Now discontinued.  Given progressive disease is unlikely to tolerate further chemotherapy.  Pain control with PRN Vicodin, Zofran and Phenergan for nausea.  Low-dose morphine to help with breathing.  Added fentanyl patch today. Palliative care following.  Overall prognosis is guarded.   Acute on chronic diastolic CHF with pulmonary edema Diuretics as needed.  Fluid discontinued and holding off on transfusion.  Currently euvolemic.   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  GERD Continue Pepcid  Severe protein calorie malnutrition Added supplement.        Code Status : DNR, guarded prognosis  Family Communication  : Wife at bedside  Disposition Plan  : Monitor on stepdown unit until further goals of care discussion  Barriers For Discharge : Active symptoms  Consults  : Oncology, PCCM, palliative care  Procedures  : CT angiogram of the chest, 2D echo  DVT Prophylaxis  : Subcu heparin  Lab Results  Component Value Date   PLT 249 04/05/2018    Antibiotics  :   Anti-infectives (From admission, onward)   Start     Dose/Rate Route Frequency Ordered Stop   04/01/18 2200  vancomycin (VANCOCIN) IVPB 750 mg/150 ml premix     750 mg 150 mL/hr over 60 Minutes Intravenous Every 24 hours 04/09/2018 2116 04/04/18 2303   04/01/18 0000  aztreonam (AZACTAM) 2 g in sodium chloride 0.9 % 100 mL IVPB     2 g 200 mL/hr over 30 Minutes Intravenous Every 8 hours 04/08/2018 2011 04/04/18 1552   04/01/2018 1745  vancomycin (VANCOCIN) IVPB 1000 mg/200 mL premix     1,000 mg 200 mL/hr over 60 Minutes Intravenous  Once 04/12/2018 1734 04/03/2018 2221   03/28/2018 1730  aztreonam (AZACTAM) 2 g in sodium  chloride 0.9 % 100 mL IVPB     2 g 200 mL/hr over 30 Minutes Intravenous  Once 04/06/2018 1725 03/29/2018 1823        Objective:   Vitals:   04/05/18 0611 04/05/18 0800 04/05/18 0837 04/05/18 1000  BP:  (!) 118/95  108/67  Pulse:  78  73  Resp:  13  10  Temp:  98 F (36.7 C)    TempSrc:  Oral    SpO2: (!) 81% 90% 93% 90%  Weight:      Height:        Wt Readings from Last 3 Encounters:  04/04/18 55.7 kg  03/28/18 54.4 kg  03/17/18 54.1 kg     Intake/Output Summary (Last 24 hours) at 04/05/2018 1248 Last data filed at 04/04/2018 1600 Gross per 24 hour  Intake 340 ml  Output 300 ml  Net 40 ml     Physical Exam  Gen: Fatigued, on high flow nasal cannula HEENT: Pallor present, temporal wasting, moist mucosa, supple neck Chest: Scattered rhonchi bilaterally CVS: N S1&S2, no murmurs, rubs or gallop GI: soft, NT, ND, BS+ Musculoskeletal: warm, no edema     Data Review:    CBC Recent Labs  Lab 04/02/18 0510 04/03/18 0353 04/04/18 0347 04/04/18 1519 04/05/18 0404  WBC 10.9* 11.3* 11.2* 11.8* 12.7*  HGB 8.8* 9.0* 8.7* 8.7* 9.0*  HCT 28.2* 28.7* 28.0* 28.4* 28.7*  PLT 224 234 236 234 249  MCV 107.6* 109.1* 108.1* 109.7* 108.7*  MCH 33.6 34.2* 33.6 33.6 34.1*  MCHC 31.2 31.4 31.1 30.6 31.4  RDW 15.2 15.2 15.0 15.4 15.6*  LYMPHSABS 0.5* 0.8 0.7 0.4* 0.6*  MONOABS 0.5 0.6 0.4 0.3 0.6  EOSABS 0.0 0.0 0.0 0.0 0.0  BASOSABS 0.0 0.0 0.0 0.0 0.0    Chemistries  Recent Labs  Lab 04/01/18 0431 04/02/18 0510 04/03/18 0353 04/04/18 0347 04/05/18 0404  NA 133* 135 138 138 138  K 4.4 4.6 4.7 5.0 4.6  CL 102 100 105 103 101  CO2 25 25 24 26 28   GLUCOSE 126* 127* 94 125* 126*  BUN 21 31* 34* 40* 44*  CREATININE 0.89 0.93 0.99 1.01 1.02  CALCIUM 7.6* 8.3* 8.5* 8.6* 8.8*  MG  --  2.3 2.4 2.4  --   AST 15 22 17 18 19   ALT 14 20 16 17 16   ALKPHOS 31* 36* 34* 37* 38  BILITOT 0.3 0.3 0.4 0.5 0.5    ------------------------------------------------------------------------------------------------------------------ No results for input(s): CHOL, HDL, LDLCALC, TRIG, CHOLHDL, LDLDIRECT in the last 72 hours.  No results found for: HGBA1C ------------------------------------------------------------------------------------------------------------------ No results for input(s): TSH, T4TOTAL, T3FREE, THYROIDAB in the last 72 hours.  Invalid input(s): FREET3 ------------------------------------------------------------------------------------------------------------------ Recent Labs    04/03/18 0353 04/04/18 0347  VITAMINB12 400  --   FOLATE 5.7*  --   FERRITIN 1,921* 2,192*  TIBC 217* 225*  IRON 85 83  RETICCTPCT 2.0 2.1    Coagulation profile No results for input(s): INR, PROTIME in the last 168 hours.  No results for input(s): DDIMER in the last 72 hours.  Cardiac Enzymes Recent Labs  Lab 04/01/18 1834 04/02/18 0057 04/02/18 0510  TROPONINI <0.03 <0.03 <0.03   ------------------------------------------------------------------------------------------------------------------    Component Value Date/Time   BNP 93.0 04/09/2018 1611    Inpatient Medications  Scheduled Meds: . sodium chloride   Intravenous Once  . acidophilus  1 capsule Oral Daily  . arformoterol  15 mcg Nebulization BID  . budesonide (PULMICORT) nebulizer solution  0.25 mg Nebulization BID  . chlorhexidine  15 mL Mouth/Throat BID  . chlorhexidine  15 mL Mouth Rinse BID  . Chlorhexidine Gluconate Cloth  6 each Topical Q0600  . dorzolamide-timolol  1 drop Left Eye BID  . famotidine  20 mg Oral BID  . feeding supplement (ENSURE ENLIVE)  237 mL Oral TID BM  . fentaNYL  12.5 mcg Transdermal Q72H  . furosemide  20 mg Intravenous Once  . furosemide  20 mg Intravenous Once  . guaiFENesin  1,200 mg Oral BID  . heparin  5,000 Units Subcutaneous Q8H  . ipratropium-albuterol  3 mL Nebulization TID  . mouth  rinse  15 mL Mouth Rinse q12n4p  . multivitamin with minerals  1 tablet Oral Daily  . nitroGLYCERIN      . pantoprazole  40 mg Oral Daily  . prednisoLONE acetate  1 drop Left Eye QID  . predniSONE  20 mg Oral BID WC   Continuous Infusions: PRN Meds:.acetaminophen, HYDROcodone-acetaminophen, ipratropium-albuterol, lip balm, LORazepam, morphine injection, MUSCLE RUB, ondansetron, prochlorperazine, sodium chloride flush  Micro Results Recent Results (from the past 240 hour(s))  Culture, blood (routine x 2) Call MD if unable to obtain prior to antibiotics being given     Status: None   Collection Time: 03/30/2018  8:28 PM  Result Value Ref Range Status   Specimen Description   Final    BLOOD LEFT Performed at [facility] [address] [place], [place] [address]    Special Requests   Final    BOTTLES DRAWN AEROBIC ONLY Blood Culture adequate volume Performed at [facility] [address] [place], [place] [address]    Culture   Final    NO GROWTH 5 DAYS Performed at [facility], [place] [address], [place], [place] [address]    Report Status 04/05/2018 FINAL  Final  Culture, blood (routine x 2) Call MD if unable to  obtain prior to antibiotics being given     Status: None   Collection Time: 04/22/2018  8:29 PM  Result Value Ref Range Status   Specimen Description   Final    BLOOD LEFT Performed at [facility] [address] [place], [place] [address]    Special Requests   Final    BOTTLES DRAWN AEROBIC ONLY Blood Culture adequate volume Performed at [facility] [address], [place], [place] [address]    Culture   Final    NO GROWTH 5 DAYS Performed at [facility], [place] [address] [place], [place] [address]    Report Status 04/05/2018 FINAL  Final  Expectorated sputum assessment w rflx to resp cult     Status: None   Collection Time: 04/01/18  5:57 PM  Result Value Ref Range Status     Specimen Description SPUTUM  Final   Special Requests NONE  Final   Sputum evaluation   Final    Sputum specimen not acceptable for testing.  Please recollect.   NOTIFIED ARNOLD,A RN @1909  ON 04/01/18 JACKSON,K Performed at [facility], [place] [address] [place], [place] [address]    Report Status 04/01/2018 FINAL  Final  MRSA PCR Screening     Status: None   Collection Time: 04/01/18  6:40 PM  Result Value Ref Range Status   MRSA by PCR NEGATIVE NEGATIVE Final    Comment:        The GeneXpert MRSA Assay (FDA approved for NASAL specimens only), is one component of a comprehensive MRSA colonization surveillance program. It is not intended to diagnose MRSA infection nor to guide or monitor treatment for MRSA infections. Performed at [facility], [place] [address], [place], [place] [address]   Expectorated sputum assessment w rflx to resp cult     Status: None   Collection Time: 04/04/18  8:56 AM  Result Value Ref Range Status   Specimen Description SPUTUM  Final   Special Requests Normal  Final   Sputum evaluation   Final    THIS SPECIMEN IS ACCEPTABLE FOR SPUTUM CULTURE Performed at [facility], [place] [address], [place], [place] [address]    Report Status 04/04/2018 FINAL  Final  Culture, respiratory     Status: None (Preliminary result)   Collection Time: 04/04/18  8:56 AM  Result Value Ref Range Status   Specimen Description   Final    SPUTUM Performed at [facility] [address] [place], [place] [address]    Special Requests   Final    Normal Reflexed from [phone] Performed at [facility], [place] [address], [place], [place] [address]    Gram Stain   Final    RARE WBC PRESENT, PREDOMINANTLY PMN RARE GRAM POSITIVE COCCI    Culture   Final    CULTURE REINCUBATED FOR BETTER GROWTH Performed at [facility], [place] [address] [place], [place] [address]    Report  Status PENDING  Incomplete    Radiology Reports Dg Chest 2 View  Result Date: 04/11/2018 CLINICAL DATA:  Shortness of breath EXAM: CHEST - 2 VIEW COMPARISON:  03/19/2018 FINDINGS: Porta catheter on the right with tip at the SVC. There is extensive bilateral interstitial and airspace opacity with progressed density in the right mid lung since prior. No effusion or pneumothorax. Normal heart size and mediastinal contours.  Remote rib fractures. IMPRESSION: 1. Increased opacity in the  right mid lung, presumably pneumonia. 2. Background of severe emphysema. Electronically Signed   By: Monte Fantasia M.D.   On: 04/12/2018 15:52   X-ray Chest Pa And Lateral  Result Date: 03/14/2018 CLINICAL DATA:  Respiratory failure EXAM: CHEST - 2 VIEW COMPARISON:  CT 02/21/2018, radiograph 02/21/2018, 01/27/2018 FINDINGS: Right-sided central venous port tip over the SVC. Emphysematous disease and bilateral fibrosis. Progressed consolidation at the right middle lobe and right base. No pleural effusion. Bilateral lung nodules corresponding to pulmonary metastatic disease. Old left-sided rib fractures. Stable cardiomediastinal silhouette. Multifocal sclerosis at the ribs and spine. IMPRESSION: 1. Progressed airspace disease at the right middle lobe and right base, may reflect atelectasis or pneumonia superimposed on chronic interstitial disease 2. Diffuse fibrosis and emphysematous disease. Bilateral lung nodules corresponding to history of metastatic pulmonary nodules. 3. Multifocal sclerosis consistent with osseous metastatic disease Electronically Signed   By: Donavan Foil M.D.   On: 03/14/2018 16:12   Ct Angio Chest Pe W/cm &/or Wo Cm  Result Date: 03/30/2018 CLINICAL DATA:  72 y/o M; shortness of breath and dyspnea. Diagnose 2 weeks ago for pneumonia. Metastatic cancer. EXAM: CT ANGIOGRAPHY CHEST WITH CONTRAST TECHNIQUE: Multidetector CT imaging of the chest was performed using the standard protocol during bolus  administration of intravenous contrast. Multiplanar CT image reconstructions and MIPs were obtained to evaluate the vascular anatomy. CONTRAST:  158mL ISOVUE-370 IOPAMIDOL (ISOVUE-370) INJECTION 76% COMPARISON:  03/14/2018 CT chest. FINDINGS: Cardiovascular: Satisfactory opacification of the pulmonary arteries. Respiratory motion artifact. No central, lobar, or proximal segmental pulmonary embolus. Downstream pulmonary arteries are poorly assessed due to motion artifact. Normal caliber thoracic aorta and main pulmonary artery. Mild calcific atherosclerosis of the aorta and moderate coronary artery calcific atherosclerosis. Normal heart size. No pericardial effusion. Mediastinum/Nodes: Stable extensive mediastinal and hilar adenopathy, for example a right lower paratracheal node measuring 21 mm short axis (series 4, image 39) and a left prevascular node measuring 12 mm (series 4, image 25). Patent central airways. Normal thoracic esophagus. Lungs/Pleura: Interval diffuse increase in interlobular septal thickening and small bilateral pleural effusions. Pulmonary metastasis are increased in size, for example a nodule in the left upper lobe lingula measuring 20 mm, previously 16 mm (series 6, image 90) and a nodule at the right lung base measuring 25 mm, previously 19 mm (series 6, image 111). Right perihilar consolidations are decreased in comparison with the prior study. Background of severe emphysema. Upper Abdomen: Cholelithiasis. Partially visualized renal cyst. Stable bilateral nonspecific adrenal nodules. Musculoskeletal: Diffuse bony metastatic disease. Extra osseous extension surrounding the left T7 facet, diffusely at T9/10, left anterior T11. Mild T11 anterior compression deformity is stable. No acute osseous abnormality identified. Review of the MIP images confirms the above findings. IMPRESSION: 1. Respiratory motion artifact. No central, lobar, or proximal segmental pulmonary embolus. Downstream pulmonary  arteries are poorly assessed due to motion artifact. 2. Interval diffuse increase in interlobular septal thickening and small bilateral pleural effusions compatible with pulmonary edema. 3. Mildly improved right perihilar consolidations which may represent postobstructive pneumonitis or pneumonia. 4. Mild increased size of pulmonary metastasis. Stable extensive mediastinal and hilar adenopathy. 5. Grossly stable diffuse bony metastatic disease. 6. Stable nonspecific adrenal nodules, likely metastasis. 7. Background of severe emphysema, aortic atherosclerosis, coronary artery calcifications. Electronically Signed   By: Kristine Garbe M.D.   On: 04/18/2018 23:18   Ct Angio Chest Pe W Or Wo Contrast  Result Date: 03/14/2018 CLINICAL DATA:  Increasing shortness of breath. PE suspected, high pretest prob EXAM: CT  ANGIOGRAPHY CHEST WITH CONTRAST TECHNIQUE: Multidetector CT imaging of the chest was performed using the standard protocol during bolus administration of intravenous contrast. Multiplanar CT image reconstructions and MIPs were obtained to evaluate the vascular anatomy. CONTRAST:  177mL ISOVUE-370 IOPAMIDOL (ISOVUE-370) INJECTION 76% COMPARISON:  Multiple prior exams including radiographs earlier this day. Most recent chest CTA 3 weeks ago 02/21/2018 FINDINGS: Cardiovascular: Right chest port with tip in the SVC. There are no filling defects within the pulmonary arteries to suggest pulmonary embolus. Thoracic atherosclerosis without aneurysm or dissection. Coronary artery calcifications versus stents. Heart size is normal. No pericardial effusion. Mediastinum/Nodes: Definite progression of mediastinal adenopathy from prior exam with increased number and size of multiple mediastinal nodes. For example highest mediastinal node image 33 series 4 measures 12 mm short axis, previously 7 mm. Right lower paratracheal node, image 46, measures 2.1 cm, previously 1.8 cm. Right hilar adenopathy has progressed  and now causes occlusion of the right middle lobe bronchus. Multiple small left hilar nodes are similar. Esophagus is nondilated. Bilateral gynecomastia. No visualized thyroid nodule. Lungs/Pleura: Advanced emphysema. Honeycombing and fibrosis at the bases again seen. Right hilar adenopathy causes complete right middle lobe collapse. Multiple pulmonary nodules. Definite increase in lingular nodule measuring 16 mm image 105 series 10, previously 9 mm. Previous right middle lobe nodule obscured by surrounding collapse. Right basilar lower lobe nodule measures 19 mm, image 117, unchanged. Multiple additional pulmonary nodules have also increased. Peripheral opacity in the left upper and lower lobes appears similar and is consistent with scarring. Ill-defined right lower lobe perihilar opacities suggest partial collapse rather than pneumonia. No significant pleural fluid. No evidence of pulmonary edema. Upper Abdomen: Bilateral adrenal nodularity as before. Cholelithiasis. Irregular upper abdominal aortic atherosclerosis. No acute findings. Musculoskeletal: Diffuse osseous metastatic disease with mixed lytic and sclerotic lesions. Extraosseous soft tissue extension of metastasis involving left lamina at T7 invades the spinal canal. Also spinal canal involvement from lesion involving right lamina of T9. No acute fracture. Review of the MIP images confirms the above findings. IMPRESSION: 1. No pulmonary embolus. 2. Progression of metastatic disease in the thorax. Progression in right hilar adenopathy causes occlusion of the right middle lobe bronchus and right middle lobe collapse. Additional mediastinal nodes have also increased in size. Increased size of multiple pulmonary metastasis. 3. Diffuse osseous metastatic disease including extraosseous soft tissue involvement of the spinal canal at T7 and T9. 4. Right lower lobe perihilar consolidation may be postobstructive or infectious. 5. Advanced emphysema with basilar  fibrosis. 6. Additional findings as described. Aortic Atherosclerosis (ICD10-I70.0) and Emphysema (ICD10-J43.9). Electronically Signed   By: Keith Rake M.D.   On: 03/14/2018 21:56   Dg Chest Port 1 View  Result Date: 04/04/2018 CLINICAL DATA:  Short of breath EXAM: PORTABLE CHEST 1 VIEW COMPARISON:  04/03/2018 FINDINGS: Underlying interstitial fibrosis. Superimposed bilateral airspace disease unchanged. No effusion. Port-A-Cath tip in the SVC unchanged. Prominent right hilum unchanged consistent with adenopathy. IMPRESSION: No interval change. Underlying interstitial fibrosis with superimposed diffuse bilateral airspace disease which could be infection or edema. Electronically Signed   By: Franchot Gallo M.D.   On: 04/04/2018 07:41   Dg Chest Port 1 View  Result Date: 04/03/2018 CLINICAL DATA:  Shortness of breath EXAM: PORTABLE CHEST 1 VIEW COMPARISON:  04/09/2018 FINDINGS: Cardiac shadow is stable. Right chest wall port is again seen. Multiple left rib fractures are again noted. Stable chronic emphysematous changes are noted. Some mild vascular congestion and patchy infiltrative opacities are again identified bilaterally relatively  stable from the prior exam. Portion of these are related to the known underlying metastatic disease. No acute bony abnormality is noted. IMPRESSION: Stable appearance of the chest when compared with the prior study. Electronically Signed   By: Inez Catalina M.D.   On: 04/03/2018 07:06    Time Spent in minutes 35   Cassadie Pankonin M.D on 04/05/2018 at 12:48 PM  Between 7am to 7pm - Pager - [phone]  After 7pm go to [email or web address] - password [facility]  [facility] -  Office  [phone]

## 2018-04-05 NOTE — Progress Notes (Signed)
Called to pt bedside for o2 saturation in 70s.  Pt adamant about taking bipap mask off at this time.  Pt placed back on hhfnc, 40L, 100% fio2.  HR67, rr14, spo2 87%.  RN aware, present in room.

## 2018-04-05 NOTE — Progress Notes (Addendum)
This Probation officer called to pt bedside overnight x3 for low o2 saturations in 60-70s while on a heated high flow nasal cannula at 40L, 100% fio2.  Pt had been refusing bipap but now willing to wear it.  Pt placed on bipap 14/7, 60% fio2 and tolerating well at this time.  HR66, rr14, spo2 90%.  RN aware.

## 2018-04-06 DIAGNOSIS — R0609 Other forms of dyspnea: Secondary | ICD-10-CM

## 2018-04-06 DIAGNOSIS — Z7189 Other specified counseling: Secondary | ICD-10-CM

## 2018-04-06 LAB — COMPREHENSIVE METABOLIC PANEL
ALBUMIN: 2.5 g/dL — AB (ref 3.5–5.0)
ALT: 14 U/L (ref 0–44)
ANION GAP: 10 (ref 5–15)
AST: 20 U/L (ref 15–41)
Alkaline Phosphatase: 41 U/L (ref 38–126)
BUN: 40 mg/dL — ABNORMAL HIGH (ref 8–23)
CHLORIDE: 97 mmol/L — AB (ref 98–111)
CO2: 28 mmol/L (ref 22–32)
CREATININE: 0.94 mg/dL (ref 0.61–1.24)
Calcium: 8.8 mg/dL — ABNORMAL LOW (ref 8.9–10.3)
GFR calc non Af Amer: >60 mL/min (ref >60)
Glucose, Bld: 119 mg/dL — ABNORMAL HIGH (ref 70–99)
Potassium: 4.6 mmol/L (ref 3.5–5.1)
Sodium: 135 mmol/L (ref 135–145)
Total Bilirubin: 0.8 mg/dL (ref 0.3–1.2)
Total Protein: 6 g/dL — ABNORMAL LOW (ref 6.5–8.1)

## 2018-04-06 LAB — CULTURE, RESPIRATORY: CULTURE: NORMAL

## 2018-04-06 LAB — CBC WITH DIFFERENTIAL/PLATELET
Abs Immature Granulocytes: 0.66 10*3/uL — ABNORMAL HIGH (ref 0.00–0.07)
BASOS ABS: 0 10*3/uL (ref 0.0–0.1)
Basophils Relative: 0 %
EOS ABS: 0 10*3/uL (ref 0.0–0.5)
Eosinophils Relative: 0 %
HCT: 30.1 % — ABNORMAL LOW (ref 39.0–52.0)
HEMOGLOBIN: 9.3 g/dL — AB (ref 13.0–17.0)
IMMATURE GRANULOCYTES: 5 %
LYMPHS PCT: 5 %
Lymphs Abs: 0.7 10*3/uL (ref 0.7–4.0)
MCH: 33.7 pg (ref 26.0–34.0)
MCHC: 30.9 g/dL (ref 30.0–36.0)
MCV: 109.1 fL — AB (ref 80.0–100.0)
MONOS PCT: 4 %
Monocytes Absolute: 0.6 10*3/uL (ref 0.1–1.0)
NEUTROS PCT: 86 %
Neutro Abs: 11.9 10*3/uL — ABNORMAL HIGH (ref 1.7–7.7)
Platelets: 256 10*3/uL (ref 150–400)
RBC: 2.76 MIL/uL — ABNORMAL LOW (ref 4.22–5.81)
RDW: 15.4 % (ref 11.5–15.5)
WBC: 13.9 10*3/uL — ABNORMAL HIGH (ref 4.0–10.5)
nRBC: 0.4 % — ABNORMAL HIGH (ref 0.0–0.2)

## 2018-04-06 LAB — CULTURE, RESPIRATORY W GRAM STAIN: Special Requests: NORMAL

## 2018-04-06 MED ORDER — LACTULOSE 10 GM/15ML PO SOLN
20.0000 g | Freq: Two times a day (BID) | ORAL | Status: DC
  Filled 2018-04-06: qty 30

## 2018-04-06 MED ORDER — BUDESONIDE 0.5 MG/2ML IN SUSP
0.5000 mg | Freq: Two times a day (BID) | RESPIRATORY_TRACT | Status: DC
  Administered 2018-04-06 – 2018-04-07 (×2): 0.5 mg via RESPIRATORY_TRACT
  Filled 2018-04-06 (×3): qty 2

## 2018-04-06 MED ORDER — LEUPROLIDE ACETATE 7.5 MG IM KIT
7.5000 mg | PACK | Freq: Once | INTRAMUSCULAR | Status: DC
  Filled 2018-04-06: qty 7.5

## 2018-04-06 MED ORDER — LEUPROLIDE ACETATE (4 MONTH) 30 MG IM KIT: 30.0000 mg | PACK | Freq: Once | INTRAMUSCULAR | Status: DC

## 2018-04-06 NOTE — Progress Notes (Addendum)
PROGRESS NOTE                                                                                                                                                                                                             Patient Demographics:    Alexander Ellis, is a 72 y.o. male, DOB - 16-Apr-1946, TOI:[id number]  Admit date - 03/30/2018   Admitting Physician Elwyn Reach, MD  Outpatient Primary MD for the patient is [name], NP  LOS - 6  Outpatient Specialists: Oncology (Dr. Marin Olp)  Chief Complaint  Patient presents with  . 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  Patient managed in stepdown unit with high flow nasal cannula (initially required BiPAP)   Subjective:   Patient still requiring 40% high flow nasal cannula without much improvement.  He is confused and yelling out frequently.  He reported episodes of panic attack with dyspnea during the night.  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 Required BiPAP for several days, IV steroid and antibiotic.  Now continuously requiring high flow nasal cannula.  BiPAP only for comfort. PCCM following.  CT angiogram of the chest was negative for PE. Continue scheduled and PRN nebs, Pulmicort nebs twice daily.  Was on IV Solu-Medrol for several days and switch to oral prednisone but I suspect this is making him irritable and confused so I will discontinue it.  Continue antitussives. Added fentanyl patch by  oncologist for his dyspnea and anxiety.    Palliative care following.  Overall prognosis is guarded.  Discussed options of hospice with patient and his wife.  Patient yells out that he does not want to go to hospice.  His oncologist feels that he is a candidate for hospice as well.  We will ask pulmonary and palliative care to wean further.  Active problems Healthcare associated pneumonia [facility]) Completed 5-day course of antibiotics  Metastatic prostate  cancer to the bone and the lungs. Received 2 cycles of chemotherapy with Taxotere.  Now discontinued.  Given progressive disease is unlikely to tolerate further chemotherapy.  Pain control with PRN Vicodin, Zofran and Phenergan for nausea.  Low-dose morphine to help with breathing.  Continue fentanyl patch. Oncologist ordered Lupron today. Has guarded prognosis.   Acute on chronic diastolic CHF with pulmonary edema Diuretics as needed.  Fluid discontinued and holding off on transfusion.  Currently euvolemic.   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  GERD Continue Pepcid  Severe protein calorie malnutrition Added supplement.        Code Status : DNR, guarded prognosis  Family Communication  : Wife at bedside  Disposition Plan  : Monitor on stepdown unit until further goals of care discussion  Barriers For Discharge : Active symptoms  Consults  : Oncology, PCCM, palliative care  Procedures  : CT angiogram of the chest, 2D echo  DVT Prophylaxis  : Subcu heparin  Lab Results  Component Value Date   PLT 256 04/06/2018    Antibiotics  :   Anti-infectives (From admission, onward)   Start     Dose/Rate Route Frequency Ordered Stop   04/01/18 2200  vancomycin (VANCOCIN) IVPB 750 mg/150 ml premix     750 mg 150 mL/hr over 60 Minutes Intravenous Every 24 hours 04/18/2018 2116 04/04/18 2303   04/01/18 0000  aztreonam (AZACTAM) 2 g in sodium chloride 0.9 % 100 mL IVPB     2 g 200 mL/hr over 30 Minutes Intravenous  Every 8 hours 04/03/2018 2011 04/04/18 1552   03/25/2018 1745  vancomycin (VANCOCIN) IVPB 1000 mg/200 mL premix     1,000 mg 200 mL/hr over 60 Minutes Intravenous  Once 04/18/2018 1734 03/26/2018 2221   03/26/2018 1730  aztreonam (AZACTAM) 2 g in sodium chloride 0.9 % 100 mL IVPB     2 g 200 mL/hr over 30 Minutes Intravenous  Once 04/01/2018 1725 04/16/2018 1823        Objective:   Vitals:   04/06/18 0333 04/06/18 0400 04/06/18 0500 04/06/18 0800  BP:  (!) 98/54  125/83  Pulse:  70  83  Resp:  (!) 8  20  Temp: (!) 97.4 F (36.3 C)   (!) 96.5 F (35.8 C)  TempSrc: Axillary   Axillary  SpO2:  98%  98%  Weight:   53.3 kg   Height:        Wt Readings from Last 3 Encounters:  04/06/18 53.3 kg  03/28/18 54.4 kg  03/17/18 54.1 kg     Intake/Output Summary (Last 24 hours) at 04/06/2018 1025 Last data filed at 04/06/2018 0800 Gross per 24 hour  Intake 240 ml  Output 350 ml  Net -110 ml      Physical exam Appears fatigued, on high flow nasal cannula HEENT: Temporal wasting, moist mucosa, supple neck Chest: Few scattered rhonchi bilaterally CVS: Normal S1 and S2, no murmurs GI: Soft, nondistended, nontender Musculoskeletal: Warm, no edema     Data Review:    CBC Recent Labs  Lab 04/03/18 0353 04/04/18 0347 04/04/18 1519 04/05/18 0404 04/06/18 0655  WBC 11.3* 11.2* 11.8* 12.7* 13.9*  HGB 9.0* 8.7* 8.7* 9.0* 9.3*  HCT 28.7* 28.0* 28.4* 28.7* 30.1*  PLT 234 236 234 249 256  MCV 109.1* 108.1* 109.7* 108.7* 109.1*  MCH 34.2* 33.6 33.6 34.1* 33.7  MCHC 31.4 31.1 30.6 31.4 30.9  RDW 15.2 15.0 15.4 15.6* 15.4  LYMPHSABS 0.8 0.7 0.4* 0.6* 0.7  MONOABS 0.6 0.4 0.3 0.6 0.6  EOSABS 0.0 0.0 0.0 0.0 0.0  BASOSABS 0.0 0.0 0.0 0.0 0.0    Chemistries  Recent Labs  Lab 04/02/18 0510 04/03/18 0353 04/04/18 0347 04/05/18 0404 04/06/18 0543 04/06/18 0655  NA 135 138 138 138 QUESTIONABLE RESULTS, RECOMMEND RECOLLECT TO VERIFY 135  K 4.6 4.7 5.0 4.6 QUESTIONABLE RESULTS,  RECOMMEND RECOLLECT TO VERIFY 4.6  CL 100 105 103 101 QUESTIONABLE RESULTS, RECOMMEND RECOLLECT TO VERIFY 97*  CO2 25 24 26 28  QUESTIONABLE RESULTS, RECOMMEND RECOLLECT TO VERIFY 28  GLUCOSE 127* 94 125* 126* QUESTIONABLE RESULTS, RECOMMEND RECOLLECT TO VERIFY 119*  BUN 31* 34* 40* 44* QUESTIONABLE RESULTS, RECOMMEND RECOLLECT TO VERIFY 40*  CREATININE 0.93 0.99 1.01 1.02 QUESTIONABLE RESULTS, RECOMMEND RECOLLECT TO VERIFY 0.94  CALCIUM 8.3* 8.5* 8.6* 8.8* QUESTIONABLE RESULTS, RECOMMEND RECOLLECT TO VERIFY 8.8*  MG 2.3 2.4 2.4  --   --   --   AST 22 17 18 19  QUESTIONABLE RESULTS, RECOMMEND RECOLLECT TO VERIFY 20  ALT 20 16 17 16  QUESTIONABLE RESULTS, RECOMMEND RECOLLECT TO VERIFY 14  ALKPHOS 36* 34* 37* 38 QUESTIONABLE RESULTS, RECOMMEND RECOLLECT TO VERIFY 41  BILITOT 0.3 0.4 0.5 0.5 QUESTIONABLE RESULTS, RECOMMEND RECOLLECT TO VERIFY 0.8   ------------------------------------------------------------------------------------------------------------------ No results for input(s): CHOL, HDL, LDLCALC, TRIG, CHOLHDL, LDLDIRECT in the last 72 hours.  No results found for: HGBA1C ------------------------------------------------------------------------------------------------------------------ No results for input(s): TSH, T4TOTAL, T3FREE, THYROIDAB in the last 72 hours.  Invalid input(s): FREET3 ------------------------------------------------------------------------------------------------------------------ Recent Labs    04/04/18 0347  FERRITIN 2,192*  TIBC 225*  IRON 83  RETICCTPCT 2.1    Coagulation profile No results for input(s): INR, PROTIME in the last 168 hours.  No results for input(s): DDIMER in the last 72 hours.  Cardiac Enzymes Recent Labs  Lab 04/01/18 1834 04/02/18 0057 04/02/18 0510  TROPONINI <0.03 <0.03 <0.03   ------------------------------------------------------------------------------------------------------------------    Component Value Date/Time    BNP 93.0 04/09/2018 1611    Inpatient Medications  Scheduled Meds: . sodium chloride   Intravenous Once  . acidophilus  1 capsule Oral Daily  . arformoterol  15 mcg Nebulization BID  . budesonide (PULMICORT) nebulizer solution  0.25 mg Nebulization BID  . chlorhexidine  15 mL Mouth/Throat BID  . chlorhexidine  15 mL Mouth Rinse BID  . Chlorhexidine Gluconate Cloth  6 each Topical Q0600  . dorzolamide-timolol  1 drop Left Eye BID  . famotidine  20 mg Oral BID  . feeding supplement (ENSURE ENLIVE)  237 mL Oral TID BM  . fentaNYL  12.5 mcg Transdermal Q72H  . furosemide  20 mg Intravenous Once  . furosemide  20 mg Intravenous Once  . guaiFENesin  1,200 mg Oral BID  . heparin  5,000 Units Subcutaneous Q8H  . ipratropium-albuterol  3 mL Nebulization TID  . lactulose  20 g Oral BID  . leuprolide  7.5 mg Intramuscular Once  . mouth rinse  15 mL Mouth Rinse q12n4p  . multivitamin with minerals  1 tablet Oral Daily  . pantoprazole  40 mg Oral Daily  . prednisoLONE acetate  1 drop Left Eye QID   Continuous Infusions: PRN Meds:.acetaminophen, HYDROcodone-acetaminophen, ipratropium-albuterol, lip balm, LORazepam, morphine injection, MUSCLE RUB, ondansetron, prochlorperazine, sodium chloride flush  Micro Results Recent Results (from the past 240 hour(s))  Culture, blood (routine x 2) Call MD if unable to obtain prior to antibiotics being given     Status: None   Collection Time: 03/24/2018  8:28 PM  Result Value Ref  Range Status   Specimen Description   Final    BLOOD LEFT Performed at [facility] [address] [place], [place] [address]    Special Requests   Final    BOTTLES DRAWN AEROBIC ONLY Blood Culture adequate volume Performed at [facility] [address], [place], [place] [address]    Culture   Final    NO GROWTH 5 DAYS Performed at [facility], [place] [address] [place], [place] [address]    Report Status 04/05/2018 FINAL   Final  Culture, blood (routine x 2) Call MD if unable to obtain prior to antibiotics being given     Status: None   Collection Time: 03/28/2018  8:29 PM  Result Value Ref Range Status   Specimen Description   Final    BLOOD LEFT Performed at [facility] [address] [place], [place] [address]    Special Requests   Final    BOTTLES DRAWN AEROBIC ONLY Blood Culture adequate volume Performed at [facility] [address] [place], [place] [address]    Culture   Final    NO GROWTH 5 DAYS Performed at [facility], [place] [address], [place], [place] [address]    Report Status 04/05/2018 FINAL  Final  Expectorated sputum assessment w rflx to resp cult     Status: None   Collection Time: 04/01/18  5:57 PM  Result Value Ref Range Status   Specimen Description SPUTUM  Final   Special Requests NONE  Final   Sputum evaluation   Final    Sputum specimen not acceptable for testing.  Please recollect.   NOTIFIED ARNOLD,A RN @1909  ON 04/01/18 JACKSON,K Performed at [facility], [place] [address] [place], [place] [address]    Report Status 04/01/2018 FINAL  Final  MRSA PCR Screening     Status: None   Collection Time: 04/01/18  6:40 PM  Result Value Ref Range Status   MRSA by PCR NEGATIVE NEGATIVE Final    Comment:        The GeneXpert MRSA Assay (FDA approved for NASAL specimens only), is one component of a comprehensive MRSA colonization surveillance program. It is not intended to diagnose MRSA infection nor to guide or monitor treatment for MRSA infections. Performed at [facility], [place] [address], [place], [place] [address]   Expectorated sputum assessment w rflx to resp cult     Status: None   Collection Time: 04/04/18  8:56 AM  Result Value Ref Range Status   Specimen Description SPUTUM  Final   Special Requests Normal  Final   Sputum evaluation   Final    THIS SPECIMEN IS ACCEPTABLE FOR SPUTUM  CULTURE Performed at [facility], [place] [address], [place], [place] [address]    Report Status 04/04/2018 FINAL  Final  Culture, respiratory     Status: None   Collection Time: 04/04/18  8:56 AM  Result Value Ref Range Status   Specimen Description   Final    SPUTUM Performed at [facility] [address] [place],  [address]    Special Requests   Final    Normal Reflexed from [phone] Performed at [facility], [place] [address], [place], [place] [address]    Gram Stain   Final    RARE WBC PRESENT, PREDOMINANTLY PMN RARE GRAM POSITIVE COCCI    Culture   Final  FEW Consistent with normal respiratory flora. Performed at [facility], [place] [address] [place], [place] [address]    Report Status 04/06/2018 FINAL  Final    Radiology Reports Dg Chest 2 View  Result Date: 03/25/2018 CLINICAL DATA:  Shortness of breath EXAM: CHEST - 2 VIEW COMPARISON:  03/19/2018 FINDINGS: Porta catheter on the right with tip at the SVC. There is extensive bilateral interstitial and airspace opacity with progressed density in the right mid lung since prior. No effusion or pneumothorax. Normal heart size and mediastinal contours.  Remote rib fractures. IMPRESSION: 1. Increased opacity in the right mid lung, presumably pneumonia. 2. Background of severe emphysema. Electronically Signed   By: Monte Fantasia M.D.   On: 04/03/2018 15:52   X-ray Chest Pa And Lateral  Result Date: 03/14/2018 CLINICAL DATA:  Respiratory failure EXAM: CHEST - 2 VIEW COMPARISON:  CT 02/21/2018, radiograph 02/21/2018, 01/27/2018 FINDINGS: Right-sided central venous port tip over the SVC. Emphysematous disease and bilateral fibrosis. Progressed consolidation at the right middle lobe and right base. No pleural effusion. Bilateral lung nodules corresponding to pulmonary metastatic disease. Old left-sided rib fractures. Stable cardiomediastinal silhouette. Multifocal  sclerosis at the ribs and spine. IMPRESSION: 1. Progressed airspace disease at the right middle lobe and right base, may reflect atelectasis or pneumonia superimposed on chronic interstitial disease 2. Diffuse fibrosis and emphysematous disease. Bilateral lung nodules corresponding to history of metastatic pulmonary nodules. 3. Multifocal sclerosis consistent with osseous metastatic disease Electronically Signed   By: Donavan Foil M.D.   On: 03/14/2018 16:12   Ct Angio Chest Pe W/cm &/or Wo Cm  Result Date: 03/27/2018 CLINICAL DATA:  72 y/o M; shortness of breath and dyspnea. Diagnose 2 weeks ago for pneumonia. Metastatic cancer. EXAM: CT ANGIOGRAPHY CHEST WITH CONTRAST TECHNIQUE: Multidetector CT imaging of the chest was performed using the standard protocol during bolus administration of intravenous contrast. Multiplanar CT image reconstructions and MIPs were obtained to evaluate the vascular anatomy. CONTRAST:  138mL ISOVUE-370 IOPAMIDOL (ISOVUE-370) INJECTION 76% COMPARISON:  03/14/2018 CT chest. FINDINGS: Cardiovascular: Satisfactory opacification of the pulmonary arteries. Respiratory motion artifact. No central, lobar, or proximal segmental pulmonary embolus. Downstream pulmonary arteries are poorly assessed due to motion artifact. Normal caliber thoracic aorta and main pulmonary artery. Mild calcific atherosclerosis of the aorta and moderate coronary artery calcific atherosclerosis. Normal heart size. No pericardial effusion. Mediastinum/Nodes: Stable extensive mediastinal and hilar adenopathy, for example a right lower paratracheal node measuring 21 mm short axis (series 4, image 39) and a left prevascular node measuring 12 mm (series 4, image 25). Patent central airways. Normal thoracic esophagus. Lungs/Pleura: Interval diffuse increase in interlobular septal thickening and small bilateral pleural effusions. Pulmonary metastasis are increased in size, for example a nodule in the left upper lobe  lingula measuring 20 mm, previously 16 mm (series 6, image 90) and a nodule at the right lung base measuring 25 mm, previously 19 mm (series 6, image 111). Right perihilar consolidations are decreased in comparison with the prior study. Background of severe emphysema. Upper Abdomen: Cholelithiasis. Partially visualized renal cyst. Stable bilateral nonspecific adrenal nodules. Musculoskeletal: Diffuse bony metastatic disease. Extra osseous extension surrounding the left T7 facet, diffusely at T9/10, left anterior T11. Mild T11 anterior compression deformity is stable. No acute osseous abnormality identified. Review of the MIP images confirms the above findings. IMPRESSION: 1. Respiratory motion artifact. No central, lobar, or proximal segmental pulmonary embolus. Downstream pulmonary arteries are poorly assessed due to motion artifact. 2. Interval diffuse increase in  interlobular septal thickening and small bilateral pleural effusions compatible with pulmonary edema. 3. Mildly improved right perihilar consolidations which may represent postobstructive pneumonitis or pneumonia. 4. Mild increased size of pulmonary metastasis. Stable extensive mediastinal and hilar adenopathy. 5. Grossly stable diffuse bony metastatic disease. 6. Stable nonspecific adrenal nodules, likely metastasis. 7. Background of severe emphysema, aortic atherosclerosis, coronary artery calcifications. Electronically Signed   By: Kristine Garbe M.D.   On: 04/09/2018 23:18   Ct Angio Chest Pe W Or Wo Contrast  Result Date: 03/14/2018 CLINICAL DATA:  Increasing shortness of breath. PE suspected, high pretest prob EXAM: CT ANGIOGRAPHY CHEST WITH CONTRAST TECHNIQUE: Multidetector CT imaging of the chest was performed using the standard protocol during bolus administration of intravenous contrast. Multiplanar CT image reconstructions and MIPs were obtained to evaluate the vascular anatomy. CONTRAST:  164mL ISOVUE-370 IOPAMIDOL (ISOVUE-370)  INJECTION 76% COMPARISON:  Multiple prior exams including radiographs earlier this day. Most recent chest CTA 3 weeks ago 02/21/2018 FINDINGS: Cardiovascular: Right chest port with tip in the SVC. There are no filling defects within the pulmonary arteries to suggest pulmonary embolus. Thoracic atherosclerosis without aneurysm or dissection. Coronary artery calcifications versus stents. Heart size is normal. No pericardial effusion. Mediastinum/Nodes: Definite progression of mediastinal adenopathy from prior exam with increased number and size of multiple mediastinal nodes. For example highest mediastinal node image 33 series 4 measures 12 mm short axis, previously 7 mm. Right lower paratracheal node, image 46, measures 2.1 cm, previously 1.8 cm. Right hilar adenopathy has progressed and now causes occlusion of the right middle lobe bronchus. Multiple small left hilar nodes are similar. Esophagus is nondilated. Bilateral gynecomastia. No visualized thyroid nodule. Lungs/Pleura: Advanced emphysema. Honeycombing and fibrosis at the bases again seen. Right hilar adenopathy causes complete right middle lobe collapse. Multiple pulmonary nodules. Definite increase in lingular nodule measuring 16 mm image 105 series 10, previously 9 mm. Previous right middle lobe nodule obscured by surrounding collapse. Right basilar lower lobe nodule measures 19 mm, image 117, unchanged. Multiple additional pulmonary nodules have also increased. Peripheral opacity in the left upper and lower lobes appears similar and is consistent with scarring. Ill-defined right lower lobe perihilar opacities suggest partial collapse rather than pneumonia. No significant pleural fluid. No evidence of pulmonary edema. Upper Abdomen: Bilateral adrenal nodularity as before. Cholelithiasis. Irregular upper abdominal aortic atherosclerosis. No acute findings. Musculoskeletal: Diffuse osseous metastatic disease with mixed lytic and sclerotic lesions.  Extraosseous soft tissue extension of metastasis involving left lamina at T7 invades the spinal canal. Also spinal canal involvement from lesion involving right lamina of T9. No acute fracture. Review of the MIP images confirms the above findings. IMPRESSION: 1. No pulmonary embolus. 2. Progression of metastatic disease in the thorax. Progression in right hilar adenopathy causes occlusion of the right middle lobe bronchus and right middle lobe collapse. Additional mediastinal nodes have also increased in size. Increased size of multiple pulmonary metastasis. 3. Diffuse osseous metastatic disease including extraosseous soft tissue involvement of the spinal canal at T7 and T9. 4. Right lower lobe perihilar consolidation may be postobstructive or infectious. 5. Advanced emphysema with basilar fibrosis. 6. Additional findings as described. Aortic Atherosclerosis (ICD10-I70.0) and Emphysema (ICD10-J43.9). Electronically Signed   By: Keith Rake M.D.   On: 03/14/2018 21:56   Dg Chest Port 1 View  Result Date: 04/04/2018 CLINICAL DATA:  Short of breath EXAM: PORTABLE CHEST 1 VIEW COMPARISON:  04/03/2018 FINDINGS: Underlying interstitial fibrosis. Superimposed bilateral airspace disease unchanged. No effusion. Port-A-Cath tip in the SVC  unchanged. Prominent right hilum unchanged consistent with adenopathy. IMPRESSION: No interval change. Underlying interstitial fibrosis with superimposed diffuse bilateral airspace disease which could be infection or edema. Electronically Signed   By: Franchot Gallo M.D.   On: 04/04/2018 07:41   Dg Chest Port 1 View  Result Date: 04/03/2018 CLINICAL DATA:  Shortness of breath EXAM: PORTABLE CHEST 1 VIEW COMPARISON:  03/26/2018 FINDINGS: Cardiac shadow is stable. Right chest wall port is again seen. Multiple left rib fractures are again noted. Stable chronic emphysematous changes are noted. Some mild vascular congestion and patchy infiltrative opacities are again identified  bilaterally relatively stable from the prior exam. Portion of these are related to the known underlying metastatic disease. No acute bony abnormality is noted. IMPRESSION: Stable appearance of the chest when compared with the prior study. Electronically Signed   By: Inez Catalina M.D.   On: 04/03/2018 07:06    Time Spent in minutes 35   Milcah Dulany M.D on 04/06/2018 at 10:25 AM  Between 7am to 7pm - Pager - [phone]  After 7pm go to [email or web address] - password [facility]  [facility] -  Office  [phone]

## 2018-04-06 NOTE — Progress Notes (Addendum)
NAME:  LAWERNCE EARLL, MRN:  [id number], DOB:  06/01/1945, LOS: 6 ADMISSION DATE:  04/21/2018, CONSULTATION DATE:  04/01/18 REFERRING MD:  Raiford Noble, DO CHIEF COMPLAINT:  Acute on chronic hypoxemic respiratory failure  Brief History   72 year old male with history of prostate cancer with bone and metastasis, COPD, acute on chronic hypoxemic respiratory failure on 6 to 8 L of O2, chronic prednisone therapy, abdominal aortic aneurysm, hepatitis B and GERD who was admitted for COPD exacerbation. Developed respiratory distress and hypoxemia requiring rebreather. Critical care consulted for acute hypoxemic respiratory failure.  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, emphysema, GERD, abdominal aortic aneurysm  Significant Hospital Events   11/8 admitted COPD exacerbation secondary pneumonia 11/9 Transferred to ICU for acute hypoxemic respiratory failure 11/10 Started on BiPAP 11/11 on high flow oxygen, saturations 88-90 11/12 still requiring high FiO2-off BiPAP at present.  ABX stopped 11/14 Remains on HFNC 100%  Consults: date of consult/date signed off & final recs:  11/9 PCCM  Procedures (surgical and bedside):    Significant Diagnostic Tests:  CTA 11/8 >> no PE, intralobular septal thickening, small bilateral pleural effusions right perihilar airspace disease  Micro Data:  BCx 11/8 >> negative Sputum 11/12 >> normal flora  Antimicrobials:  Vanc 11/8 >> 11/12 Aztreonam 11/8 >> 11/12   Subjective:  RN reports pt confused, yelling out frequently.  Episodes of panic with SOB. Wife reports he did not rest well overnight. He is anxious to get out of the ICU.   Objective   Blood pressure 125/83, pulse 83, temperature (!) 96.5 F (35.8 C), temperature source Axillary, resp. rate 20, height 5\' 9"  (1.753 m), weight 53.3 kg, SpO2 98 %.    FiO2 (%):  [100 %] 100 %   Intake/Output Summary (Last 24 hours) at 04/06/2018 1050 Last data filed at 04/06/2018  0800 Gross per 24 hour  Intake 240 ml  Output 350 ml  Net -110 ml   Filed Weights   04/03/18 0354 04/04/18 0400 04/06/18 0500  Weight: 54.5 kg 55.7 kg 53.3 kg   Examination: General: fail elderly male lying in bed in NAD HEENT: MM pink/dry, no jvd Neuro: Awake, alert, oriented, mildly anxious, MAE CV: s1s2 rrr, no m/r/g, R chest port  PULM: even/non-labored, lungs bilaterally with scattered wheezing  FU:XNAT, non-tender, bsx4 active  Extremities: warm/dry, no edema  Skin: no rashes or lesions  Assessment & Plan:   Acute on chronic hypoxemic respiratory failure -6-8 L O2 at baseline at home prior to admit  P: Continue O2 to support treatment for dyspnea / discomfort  Would focus less on a particular sat goal as long as he is not labored  PRN morphine for increased work of breathing   COPD exacerbation - FEV1 of 79%, diffusing capacity of 16% P: Monitor off steroids Continue bronchodilators  Adjust pulmicort dosing  Metastatic prostate cancer P: Not a candidate for further therapy  Dr. Marin Olp, Palliative Care following   Goals of Care DNR / DNI  Not likely he will make it home in the short term due to O2 needs.   Supportive care for wife / family    Disposition / Summary of Today's Plan 04/06/18   Consider transition out of ICU/SDU to aide in agitation/delirum.  PCCM will be available PRN.  Please call back if new needs arise.     Diet: Advance as tolerated  DVT prophylaxis: Subcutaneous heparin GI prophylaxis: Pepcid Hyperglycemia protocol: SSI Mobility: Bedrest Code Status:  DNR/DNI Family Communication: Patient and wife updated at bedside  Labs   CBC: Recent Labs  Lab 04/03/18 0353 04/04/18 0347 04/04/18 1519 04/05/18 0404 04/06/18 0655  WBC 11.3* 11.2* 11.8* 12.7* 13.9*  NEUTROABS 9.7* 9.7* 10.8* 11.1* 11.9*  HGB 9.0* 8.7* 8.7* 9.0* 9.3*  HCT 28.7* 28.0* 28.4* 28.7* 30.1*  MCV 109.1* 108.1* 109.7* 108.7* 109.1*  PLT 234 236 234 249 256     Basic Metabolic Panel: Recent Labs  Lab 04/02/18 0510 04/03/18 0353 04/04/18 0347 04/05/18 0404 04/06/18 0543 04/06/18 0655  NA 135 138 138 138 QUESTIONABLE RESULTS, RECOMMEND RECOLLECT TO VERIFY 135  K 4.6 4.7 5.0 4.6 QUESTIONABLE RESULTS, RECOMMEND RECOLLECT TO VERIFY 4.6  CL 100 105 103 101 QUESTIONABLE RESULTS, RECOMMEND RECOLLECT TO VERIFY 97*  CO2 25 24 26 28  QUESTIONABLE RESULTS, RECOMMEND RECOLLECT TO VERIFY 28  GLUCOSE 127* 94 125* 126* QUESTIONABLE RESULTS, RECOMMEND RECOLLECT TO VERIFY 119*  BUN 31* 34* 40* 44* QUESTIONABLE RESULTS, RECOMMEND RECOLLECT TO VERIFY 40*  CREATININE 0.93 0.99 1.01 1.02 QUESTIONABLE RESULTS, RECOMMEND RECOLLECT TO VERIFY 0.94  CALCIUM 8.3* 8.5* 8.6* 8.8* QUESTIONABLE RESULTS, RECOMMEND RECOLLECT TO VERIFY 8.8*  MG 2.3 2.4 2.4  --   --   --   PHOS 3.4 3.8 3.3  --   --   --    GFR: Estimated Creatinine Clearance: 52 mL/min (by C-G formula based on SCr of 0.94 mg/dL). Recent Labs  Lab 04/01/18 1834 04/02/18 0510 04/03/18 0353 04/04/18 0347 04/04/18 1519 04/05/18 0404 04/06/18 0655  PROCALCITON <0.10 <0.10 <0.10  --   --   --   --   WBC  --  10.9* 11.3* 11.2* 11.8* 12.7* 13.9*    Liver Function Tests: Recent Labs  Lab 04/03/18 0353 04/04/18 0347 04/05/18 0404 04/06/18 0543 04/06/18 0655  AST 17 18 19  QUESTIONABLE RESULTS, RECOMMEND RECOLLECT TO VERIFY 20  ALT 16 17 16  QUESTIONABLE RESULTS, RECOMMEND RECOLLECT TO VERIFY 14  ALKPHOS 34* 37* 38 QUESTIONABLE RESULTS, RECOMMEND RECOLLECT TO VERIFY 41  BILITOT 0.4 0.5 0.5 QUESTIONABLE RESULTS, RECOMMEND RECOLLECT TO VERIFY 0.8  PROT 5.7* 5.9* 5.9* QUESTIONABLE RESULTS, RECOMMEND RECOLLECT TO VERIFY 6.0*  ALBUMIN 2.2* 2.4* 2.4* QUESTIONABLE RESULTS, RECOMMEND RECOLLECT TO VERIFY 2.5*   No results for input(s): LIPASE, AMYLASE in the last 168 hours. No results for input(s): AMMONIA in the last 168 hours.  ABG    Component Value Date/Time   PHART 7.501 (H) 04/01/2018 1800    PCO2ART 33.8 04/01/2018 1800   PO2ART 43.9 (L) 04/01/2018 1800   HCO3 26.2 04/01/2018 1800   TCO2 22 07/19/2014 1317   ACIDBASEDEF 0.2 03/14/2018 1300   O2SAT 78.4 04/01/2018 1800     Coagulation Profile: No results for input(s): INR, PROTIME in the last 168 hours.  Cardiac Enzymes: Recent Labs  Lab 04/13/2018 1611 04/01/18 1834 04/02/18 0057 04/02/18 0510  TROPONINI <0.03 <0.03 <0.03 <0.03    HbA1C: No results found for: HGBA1C  CBG: Recent Labs  Lab 04/03/18 1148 04/03/18 1655 04/03/18 1925 04/03/18 2330 04/04/18 0321  GLUCAP 117* 118* 156* 151* 113*     Noe Gens, NP-C [facility] Pgr: [phone] or if no answer [phone] 04/06/2018, 10:50 AM  Attending Note:  72 year old male with PMH of prostate cancer with diffuse mets to the lungs presenting with acute on chronic hypoxemic respiratory failure.  Patient was decompensating overnight and required both 100% NRB and HFNC.  Patient this AM is asking to be transferred out of the ICU.  On exam, diffuse crackles in both lungs.  I reviewed CXR myself, mets noted.  Discussed with PCCM-NP.  Acute on chronic respiratory failure:  - HFNC  - DNI  - May need to consider comfort measures  Acute pulmonary edema:  - Diureses  PNA:  - Abx as ordered  GOC: DNR  May transfer out of the ICU.  Recommend palliation at this point.  PCCM will sign off, please call back if needed.  Patient seen and examined, agree with above note.  I dictated the care and orders written for this patient under my direction.  Rush Farmer, [place]

## 2018-04-06 NOTE — Care Plan (Signed)
Patient refusing Lupron shot. Spouse at bedside. They claim he had this shot at [facility] and had severe side effects to him including teeth loss. I will move shot to tomorrow after patient is able to follow up with Dr Marin Olp in person per his request.

## 2018-04-06 NOTE — Progress Notes (Signed)
Mr. Self is about the same.  He still requires a high amount of oxygen.  He is on a facemask rebreather now.  I spoke with critical care yesterday.  I understand the reluctance to give blood.  As always, I appreciate their input and their expertise.  I just have a bad feeling that were not good to be able to improve his pulmonary status.  I worry that he does have prostate cancer in his lungs with somewhat lymphangitic spread.  I think that he would do better if you are out of the ICU.  I think that he might be able to rest a little bit better.  If he had a larger room it might ease some of his anxiety.  There is no labs back yet today.  I will go ahead and give him a shot of Lupron.  This way, we can ensure that he is still castrate level testosterone.  He is on a regular diet now.  I am not sure he really is eating all that much.  I do think that he would be a candidate for hospice.  I know that palliative care is seeing him.  I will have to talk to he and his wife about getting hospice involved once he is able to be discharged.  I appreciate all the great care that he is getting from the staff in the ICU.  Lattie Haw, MD  1 John 2:17

## 2018-04-06 NOTE — Progress Notes (Signed)
Received pt at the beginning of night shift (1900) on both nrb and hhfnc at 40L, 100% fio2.  Attempts made to wean pt off nrb, but were unsuccessful.  Pt has had increase wob and panic attacks overnight with desaturation into the 70s.  RN aware.  RT will continue to monitor and assess pt as needed.

## 2018-04-06 NOTE — Progress Notes (Signed)
Tried pt on NRB to see if transfer to telemetry would be possible at this time. O2 sats dropped to 77% within minutes. Pt placed back on 30L 100% fio2. O@ sats now 93%. RN notified.

## 2018-04-06 NOTE — Care Management Note (Signed)
Case Management Note  Patient Details  Name: Alexander Ellis MRN: [id number] Date of Birth: [birth year]  Subjective/Objective:                  Resp. Distress using HFNC at 100% and NRB mask/ from home and uses [facility] at home/wbc-13.9 sputum culture= GPC/ nebs and po steriods  Action/Plan: Following for progression of care. Following for cm needs none present at this time.  Expected Discharge Date:  (unknown)               Expected Discharge Plan:  Home w Hospice Care  In-House Referral:     Discharge planning Services  CM Consult  Post Acute Care Choice:    Choice offered to:     DME Arranged:    DME Agency:     HH Arranged:    HH Agency:     Status of Service:  In process, will continue to follow  If discussed at Long Length of Stay Meetings, dates discussed:    Additional Comments:  Leeroy Cha, RN 04/06/2018, 10:46 AM

## 2018-04-07 DIAGNOSIS — J9622 Acute and chronic respiratory failure with hypercapnia: Secondary | ICD-10-CM

## 2018-04-07 LAB — COMPREHENSIVE METABOLIC PANEL
ALT: 16 U/L (ref 0–44)
ANION GAP: 11 (ref 5–15)
AST: 23 U/L (ref 15–41)
Albumin: 2.6 g/dL — ABNORMAL LOW (ref 3.5–5.0)
Alkaline Phosphatase: 52 U/L (ref 38–126)
BUN: 38 mg/dL — ABNORMAL HIGH (ref 8–23)
CHLORIDE: 101 mmol/L (ref 98–111)
CO2: 27 mmol/L (ref 22–32)
CREATININE: 0.93 mg/dL (ref 0.61–1.24)
Calcium: 8.7 mg/dL — ABNORMAL LOW (ref 8.9–10.3)
GFR calc Af Amer: >60 mL/min (ref >60)
Glucose, Bld: 126 mg/dL — ABNORMAL HIGH (ref 70–99)
Potassium: 4.4 mmol/L (ref 3.5–5.1)
SODIUM: 139 mmol/L (ref 135–145)
Total Bilirubin: 0.8 mg/dL (ref 0.3–1.2)
Total Protein: 6.1 g/dL — ABNORMAL LOW (ref 6.5–8.1)

## 2018-04-07 LAB — CBC WITH DIFFERENTIAL/PLATELET
ABS IMMATURE GRANULOCYTES: 0.61 10*3/uL — AB (ref 0.00–0.07)
BASOS PCT: 0 %
Basophils Absolute: 0 10*3/uL (ref 0.0–0.1)
EOS ABS: 0 10*3/uL (ref 0.0–0.5)
EOS PCT: 0 %
HCT: 31.2 % — ABNORMAL LOW (ref 39.0–52.0)
Hemoglobin: 9.7 g/dL — ABNORMAL LOW (ref 13.0–17.0)
Immature Granulocytes: 4 %
Lymphocytes Relative: 5 %
Lymphs Abs: 0.8 10*3/uL (ref 0.7–4.0)
MCH: 34.2 pg — AB (ref 26.0–34.0)
MCHC: 31.1 g/dL (ref 30.0–36.0)
MCV: 109.9 fL — AB (ref 80.0–100.0)
MONO ABS: 0.6 10*3/uL (ref 0.1–1.0)
MONOS PCT: 4 %
NEUTROS ABS: 11.9 10*3/uL — AB (ref 1.7–7.7)
Neutrophils Relative %: 87 %
PLATELETS: 248 10*3/uL (ref 150–400)
RBC: 2.84 MIL/uL — AB (ref 4.22–5.81)
RDW: 15.7 % — AB (ref 11.5–15.5)
WBC: 13.9 10*3/uL — AB (ref 4.0–10.5)
nRBC: 0.4 % — ABNORMAL HIGH (ref 0.0–0.2)

## 2018-04-07 MED ORDER — LORAZEPAM 1 MG PO TABS
1.0000 mg | ORAL_TABLET | Freq: Four times a day (QID) | ORAL | Status: DC | PRN
  Administered 2018-04-07: 1 mg via ORAL
  Filled 2018-04-07: qty 1

## 2018-04-07 MED ORDER — MORPHINE 100MG IN NS 100ML (1MG/ML) PREMIX INFUSION
1.0000 mg/h | INTRAVENOUS | Status: DC
  Administered 2018-04-07: 1 mg/h via INTRAVENOUS
  Administered 2018-04-08: 2 mg/h via INTRAVENOUS
  Filled 2018-04-07 (×2): qty 100

## 2018-04-07 MED ORDER — FENTANYL 25 MCG/HR TD PT72
25.0000 ug | MEDICATED_PATCH | TRANSDERMAL | Status: DC
  Administered 2018-04-09: 25 ug via TRANSDERMAL
  Filled 2018-04-07: qty 1

## 2018-04-07 NOTE — Progress Notes (Signed)
PMT progress note  Patient appears frail and weak, even at baseline, appears to be with at least mild to moderate dyspnea, wife present at the bedside is tearful.   2 mg IV morphine drip has been initiated.  He opens his eyes and does respond appropriately.  BP (!) 102/59   Pulse (!) 101   Temp 98.1 F (36.7 C) (Oral)   Resp (!) 21   Ht 5\' 9"  (1.753 m)   Wt 53 kg   SpO2 (!) 85%   BMI 17.25 kg/m   Labs and imaging noted.   Appears frail and weak. Few scattered rhonchi, also has wheezes anteriorly S 1 S 2 Awake alert but appears much weaker Abdomen not distended.   Acute on chronic hypoxic resp failure.  COPD exacerbation.  Metastatic prostate cancer.  Bone mets.    Discussed with patient, wife at bedside.   PPS 30%  Continue current mode of care. Has been started on morphine drip for comfort care Ativan as needed for anxiety agitation. Patient to be transferred to medical floor. Patient appropriate for transfer to residential hospice in my opinion. Patient wishes to continue with comfort measures that are being employed here, in particular, states that the morphine helps him tremendously with shortness of breath and also helps with his anxiety and air hunger. However, refuses to consider residential hospice facility transfer. Wife is tearful and understands that the patient's prognosis is guarded.   25 minutes spent.  Loistine Chance MD [facility] health palliative medicine team [phone]

## 2018-04-07 NOTE — Progress Notes (Signed)
Report called to rn 1516. Patient with no complaints at the current time. Family at bedside. Will transfer via bed.

## 2018-04-07 NOTE — Progress Notes (Addendum)
PROGRESS NOTE                                                                                                                                                                                                             Patient Demographics:    Alexander Ellis, is a 72 y.o. male, DOB - 1945/07/24, BJS:[id number]  Admit date - 03/27/2018   Admitting Physician Elwyn Reach, MD  Outpatient Primary MD for the patient is [name], NP  LOS - 7  Outpatient Specialists: Oncology (Dr. Marin Olp)  Chief Complaint  Patient presents with  . 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  Patient managed in stepdown unit with high flow nasal cannula (initially required BiPAP)   Subjective:   Still requiring both high flow nasal cannula at 30% and facemask.  Agitated frequently.  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 Required BiPAP for several days, IV steroid and antibiotic.  Now continuously requiring high flow nasal cannula.  BiPAP only for comfort. Appreciate PCCM and palliative care consult.  Discontinued steroid due to persistent agitation.  Nebs as needed.   Discussed in detail with patient, his wife and daughter at bedside.  Patient persistently refusing hospice.  Agree on low-dose morphine drip for comfort.  Ativan as needed for anxiety/agitation.  Transfer to medical floor.   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 Received 2 cycles of chemotherapy with Taxotere.  Now discontinued.  Will not tolerate chemotherapy given poor prognosis.  Low-dose morphine to help with breathing.  Continue fentanyl patch.  Now on morphine drip.     Acute on chronic diastolic CHF with pulmonary edema Diuretics as needed.  Fluid discontinued and holding off on transfusion.     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  GERD Continue Pepcid  Severe protein calorie malnutrition  Added supplement.        Code Status : DNR, comfort measures, guarded prognosis; no further blood draws, IV fluids or IV antibiotics.  Family Communication  : Wife and daughter at bedside  Disposition Plan  : Transfer to medical floor for comfort measures  Barriers For Discharge : Active symptoms  Consults  : Oncology, PCCM, palliative care  Procedures  : CT angiogram of the chest, 2D echo  DVT Prophylaxis  : Comfort measures  Lab Results  Component Value Date   PLT 248 04/07/2018    Antibiotics  :   Anti-infectives (From admission, onward)   Start     Dose/Rate Route Frequency Ordered Stop   04/01/18 2200  vancomycin (VANCOCIN) IVPB 750 mg/150 ml premix     750 mg 150 mL/hr over 60 Minutes Intravenous Every 24 hours 04/09/2018 2116 04/04/18 2303   04/01/18 0000  aztreonam (AZACTAM) 2 g in sodium chloride 0.9 % 100 mL IVPB     2 g 200 mL/hr over 30 Minutes Intravenous Every 8 hours 04/05/2018 2011 04/04/18 1552   04/02/2018 1745  vancomycin (VANCOCIN) IVPB 1000 mg/200 mL premix     1,000 mg 200 mL/hr over 60 Minutes Intravenous  Once 04/03/2018 1734 03/30/2018 2221   04/14/2018 1730  aztreonam (AZACTAM) 2 g in sodium chloride 0.9 % 100 mL IVPB     2 g 200 mL/hr over 30 Minutes Intravenous  Once 04/05/2018 1725 04/17/2018 1823        Objective:   Vitals:   04/07/18 0800 04/07/18 0813 04/07/18 0820 04/07/18 1000  BP: (!) 113/56   (!) 102/59  Pulse: (!) 106    (!) 101  Resp: 17   (!) 21  Temp:   (!) 97.1 F (36.2 C)   TempSrc:   Axillary   SpO2: (!) 89% (!) 88%  (!) 85%  Weight:      Height:        Wt Readings from Last 3 Encounters:  04/07/18 53 kg  03/28/18 54.4 kg  03/17/18 54.1 kg     Intake/Output Summary (Last 24 hours) at 04/07/2018 1238 Last data filed at 04/07/2018 1100 Gross per 24 hour  Intake 423.72 ml  Output 100 ml  Net 323.72 ml    Physical exam Fatigue, agitated HEENT: Moist mucosa, supple neck, on high flow nasal cannula and facemask Chest: Clear bilaterally CVS: Normal S1-S2 GI: Soft, nondistended, nontender Musculoskeletal: Warm, no edema       Data Review:    CBC Recent Labs  Lab 04/04/18 1519 04/05/18 0404 04/06/18 0655 04/07/18 0614 04/07/18 0737  WBC 11.8* 12.7* 13.9* QUESTIONABLE RESULTS, RECOMMEND RECOLLECT TO VERIFY 13.9*  HGB 8.7* 9.0* 9.3* QUESTIONABLE RESULTS, RECOMMEND RECOLLECT TO VERIFY 9.7*  HCT 28.4* 28.7* 30.1* QUESTIONABLE RESULTS, RECOMMEND RECOLLECT TO VERIFY 31.2*  PLT 234 249 256 QUESTIONABLE RESULTS, RECOMMEND RECOLLECT TO VERIFY 248  MCV 109.7* 108.7* 109.1* QUESTIONABLE RESULTS, RECOMMEND RECOLLECT TO VERIFY 109.9*  MCH 33.6 34.1* 33.7 QUESTIONABLE RESULTS, RECOMMEND RECOLLECT TO VERIFY 34.2*  MCHC 30.6 31.4 30.9 QUESTIONABLE RESULTS, RECOMMEND RECOLLECT TO VERIFY 31.1  RDW 15.4 15.6* 15.4 QUESTIONABLE RESULTS, RECOMMEND RECOLLECT TO VERIFY 15.7*  LYMPHSABS 0.4* 0.6* 0.7 QUESTIONABLE RESULTS, RECOMMEND RECOLLECT TO VERIFY 0.8  MONOABS 0.3 0.6 0.6 QUESTIONABLE RESULTS, RECOMMEND RECOLLECT TO VERIFY 0.6  EOSABS 0.0 0.0 0.0 QUESTIONABLE RESULTS, RECOMMEND RECOLLECT TO VERIFY 0.0  BASOSABS 0.0 0.0 0.0 QUESTIONABLE RESULTS, RECOMMEND RECOLLECT TO VERIFY 0.0    Chemistries  Recent Labs  Lab 04/02/18 0510  04/03/18 0353 04/04/18 0347 04/05/18 0404 04/06/18 0543 04/06/18 0655 04/07/18 0614 04/07/18 0737  NA 135 138 138 138 QUESTIONABLE RESULTS, RECOMMEND RECOLLECT  TO VERIFY 135 QUESTIONABLE RESULTS, RECOMMEND RECOLLECT TO VERIFY 139  K 4.6 4.7 5.0 4.6 QUESTIONABLE RESULTS, RECOMMEND RECOLLECT TO VERIFY 4.6 QUESTIONABLE RESULTS, RECOMMEND RECOLLECT TO VERIFY 4.4  CL 100 105 103 101 QUESTIONABLE RESULTS, RECOMMEND RECOLLECT TO VERIFY 97* QUESTIONABLE RESULTS, RECOMMEND RECOLLECT TO VERIFY 101  CO2 25 24 26 28  QUESTIONABLE RESULTS, RECOMMEND RECOLLECT TO VERIFY 28 QUESTIONABLE RESULTS, RECOMMEND RECOLLECT TO VERIFY 27  GLUCOSE 127* 94 125* 126* QUESTIONABLE RESULTS, RECOMMEND RECOLLECT TO VERIFY 119* QUESTIONABLE RESULTS, RECOMMEND RECOLLECT TO VERIFY 126*  BUN 31* 34* 40* 44* QUESTIONABLE RESULTS, RECOMMEND RECOLLECT TO VERIFY 40* QUESTIONABLE RESULTS, RECOMMEND RECOLLECT TO VERIFY 38*  CREATININE 0.93 0.99 1.01 1.02 QUESTIONABLE RESULTS, RECOMMEND RECOLLECT TO VERIFY 0.94 QUESTIONABLE RESULTS, RECOMMEND RECOLLECT TO VERIFY 0.93  CALCIUM 8.3* 8.5* 8.6* 8.8* QUESTIONABLE RESULTS, RECOMMEND RECOLLECT TO VERIFY 8.8* QUESTIONABLE RESULTS, RECOMMEND RECOLLECT TO VERIFY 8.7*  MG 2.3 2.4 2.4  --   --   --   --   --   AST 22 17 18 19  QUESTIONABLE RESULTS, RECOMMEND RECOLLECT TO VERIFY 20 QUESTIONABLE RESULTS, RECOMMEND RECOLLECT TO VERIFY 23  ALT 20 16 17 16  QUESTIONABLE RESULTS, RECOMMEND RECOLLECT TO VERIFY 14 QUESTIONABLE RESULTS, RECOMMEND RECOLLECT TO VERIFY 16  ALKPHOS 36* 34* 37* 38 QUESTIONABLE RESULTS, RECOMMEND RECOLLECT TO VERIFY 41 QUESTIONABLE RESULTS, RECOMMEND RECOLLECT TO VERIFY 52  BILITOT 0.3 0.4 0.5 0.5 QUESTIONABLE RESULTS, RECOMMEND RECOLLECT TO VERIFY 0.8 QUESTIONABLE RESULTS, RECOMMEND RECOLLECT TO VERIFY 0.8   ------------------------------------------------------------------------------------------------------------------ No results for input(s): CHOL, HDL, LDLCALC, TRIG, CHOLHDL, LDLDIRECT in the last 72 hours.  No results found for:  HGBA1C ------------------------------------------------------------------------------------------------------------------ No results for input(s): TSH, T4TOTAL, T3FREE, THYROIDAB in the last 72 hours.  Invalid input(s): FREET3 ------------------------------------------------------------------------------------------------------------------ No results for input(s): VITAMINB12, FOLATE, FERRITIN, TIBC, IRON, RETICCTPCT in the last 72 hours.  Coagulation profile No results for input(s): INR, PROTIME in the last 168 hours.  No results for input(s): DDIMER in the last 72 hours.  Cardiac Enzymes Recent Labs  Lab 04/01/18 1834 04/02/18 0057 04/02/18 0510  TROPONINI <0.03 <0.03 <0.03   ------------------------------------------------------------------------------------------------------------------    Component Value Date/Time   BNP 93.0 04/14/2018 1611    Inpatient Medications  Scheduled Meds: . sodium chloride   Intravenous Once  . chlorhexidine  15 mL Mouth/Throat BID  . chlorhexidine  15 mL Mouth Rinse BID  . Chlorhexidine Gluconate Cloth  6 each Topical Q0600  . dorzolamide-timolol  1 drop Left Eye BID  . famotidine  20 mg Oral BID  . feeding supplement (ENSURE ENLIVE)  237 mL Oral TID BM  . fentaNYL  12.5 mcg Transdermal Q72H  . guaiFENesin  1,200 mg Oral BID  . leuprolide  7.5 mg Intramuscular Once  . mouth rinse  15 mL Mouth Rinse q12n4p  . prednisoLONE acetate  1 drop Left Eye QID   Continuous Infusions: . morphine 2 mg/hr (04/07/18 1100)   PRN Meds:.acetaminophen, HYDROcodone-acetaminophen, ipratropium-albuterol, lip balm, LORazepam, MUSCLE RUB, ondansetron, sodium chloride flush  Micro Results Recent Results (from the past 240 hour(s))  Culture, blood (routine x 2) Call MD if unable to obtain prior to antibiotics being given     Status: None   Collection Time: 04/21/2018  8:28 PM  Result Value Ref Range Status   Specimen Description   Final    BLOOD  LEFT Performed at [facility]  [address], [place], [place] [address]    Special Requests   Final    BOTTLES DRAWN AEROBIC ONLY Blood Culture adequate volume Performed at [facility] [address] [place], [place] [address]    Culture   Final    NO GROWTH 5 DAYS Performed at [facility], [place] [address] [place], [place] [address]    Report Status 04/05/2018 FINAL  Final  Culture, blood (routine x 2) Call MD if unable to obtain prior to antibiotics being given     Status: None   Collection Time: 04/21/2018  8:29 PM  Result Value Ref Range Status   Specimen Description   Final    BLOOD LEFT Performed at [facility] [address], [place], [place] [address]    Special Requests   Final    BOTTLES DRAWN AEROBIC ONLY Blood Culture adequate volume Performed at [facility] [address] [place], [place] [address]    Culture   Final    NO GROWTH 5 DAYS Performed at [facility], [place] [address] [place], [place] [address]    Report Status 04/05/2018 FINAL  Final  Expectorated sputum assessment w rflx to resp cult     Status: None   Collection Time: 04/01/18  5:57 PM  Result Value Ref Range Status   Specimen Description SPUTUM  Final   Special Requests NONE  Final   Sputum evaluation   Final    Sputum specimen not acceptable for testing.  Please recollect.   NOTIFIED ARNOLD,A RN @1909  ON 04/01/18 JACKSON,K Performed at [facility], [place] [address], [place], [place] [address]    Report Status 04/01/2018 FINAL  Final  MRSA PCR Screening     Status: None   Collection Time: 04/01/18  6:40 PM  Result Value Ref Range Status   MRSA by PCR NEGATIVE NEGATIVE Final    Comment:        The GeneXpert MRSA Assay (FDA approved for NASAL specimens only), is one component of a comprehensive MRSA colonization surveillance program. It is not intended to diagnose MRSA infection  nor to guide or monitor treatment for MRSA infections. Performed at [facility], [place] [address] [place], [place] [address]   Expectorated sputum assessment w rflx to resp cult     Status: None   Collection Time: 04/04/18  8:56 AM  Result Value Ref Range Status   Specimen Description SPUTUM  Final   Special Requests Normal  Final   Sputum evaluation   Final    THIS SPECIMEN IS ACCEPTABLE FOR SPUTUM CULTURE Performed at [facility], [place] [address], [place], [place] [address]    Report Status 04/04/2018 FINAL  Final  Culture, respiratory     Status: None   Collection Time: 04/04/18  8:56 AM  Result Value Ref Range Status   Specimen Description   Final    SPUTUM Performed at [facility] [address], [place], [place] [address]    Special Requests   Final    Normal Reflexed from [id number] Performed at [facility], [place] [address] [place], [place] [address]    Gram Stain   Final    RARE WBC PRESENT, PREDOMINANTLY PMN RARE GRAM POSITIVE COCCI    Culture   Final    FEW Consistent with normal respiratory flora. Performed at [facility], [place] [address], [place], [place] [address]  Report Status 04/06/2018 FINAL  Final    Radiology Reports Dg Chest 2 View  Result Date: 04/12/2018 CLINICAL DATA:  Shortness of breath EXAM: CHEST - 2 VIEW COMPARISON:  03/19/2018 FINDINGS: Porta catheter on the right with tip at the SVC. There is extensive bilateral interstitial and airspace opacity with progressed density in the right mid lung since prior. No effusion or pneumothorax. Normal heart size and mediastinal contours.  Remote rib fractures. IMPRESSION: 1. Increased opacity in the right mid lung, presumably pneumonia. 2. Background of severe emphysema. Electronically Signed   By: Monte Fantasia M.D.   On: 03/27/2018 15:52   X-ray Chest Pa And Lateral  Result Date: 03/14/2018 CLINICAL DATA:  Respiratory  failure EXAM: CHEST - 2 VIEW COMPARISON:  CT 02/21/2018, radiograph 02/21/2018, 01/27/2018 FINDINGS: Right-sided central venous port tip over the SVC. Emphysematous disease and bilateral fibrosis. Progressed consolidation at the right middle lobe and right base. No pleural effusion. Bilateral lung nodules corresponding to pulmonary metastatic disease. Old left-sided rib fractures. Stable cardiomediastinal silhouette. Multifocal sclerosis at the ribs and spine. IMPRESSION: 1. Progressed airspace disease at the right middle lobe and right base, may reflect atelectasis or pneumonia superimposed on chronic interstitial disease 2. Diffuse fibrosis and emphysematous disease. Bilateral lung nodules corresponding to history of metastatic pulmonary nodules. 3. Multifocal sclerosis consistent with osseous metastatic disease Electronically Signed   By: Donavan Foil M.D.   On: 03/14/2018 16:12   Ct Angio Chest Pe W/cm &/or Wo Cm  Result Date: 04/16/2018 CLINICAL DATA:  72 y/o M; shortness of breath and dyspnea. Diagnose 2 weeks ago for pneumonia. Metastatic cancer. EXAM: CT ANGIOGRAPHY CHEST WITH CONTRAST TECHNIQUE: Multidetector CT imaging of the chest was performed using the standard protocol during bolus administration of intravenous contrast. Multiplanar CT image reconstructions and MIPs were obtained to evaluate the vascular anatomy. CONTRAST:  141mL ISOVUE-370 IOPAMIDOL (ISOVUE-370) INJECTION 76% COMPARISON:  03/14/2018 CT chest. FINDINGS: Cardiovascular: Satisfactory opacification of the pulmonary arteries. Respiratory motion artifact. No central, lobar, or proximal segmental pulmonary embolus. Downstream pulmonary arteries are poorly assessed due to motion artifact. Normal caliber thoracic aorta and main pulmonary artery. Mild calcific atherosclerosis of the aorta and moderate coronary artery calcific atherosclerosis. Normal heart size. No pericardial effusion. Mediastinum/Nodes: Stable extensive mediastinal and  hilar adenopathy, for example a right lower paratracheal node measuring 21 mm short axis (series 4, image 39) and a left prevascular node measuring 12 mm (series 4, image 25). Patent central airways. Normal thoracic esophagus. Lungs/Pleura: Interval diffuse increase in interlobular septal thickening and small bilateral pleural effusions. Pulmonary metastasis are increased in size, for example a nodule in the left upper lobe lingula measuring 20 mm, previously 16 mm (series 6, image 90) and a nodule at the right lung base measuring 25 mm, previously 19 mm (series 6, image 111). Right perihilar consolidations are decreased in comparison with the prior study. Background of severe emphysema. Upper Abdomen: Cholelithiasis. Partially visualized renal cyst. Stable bilateral nonspecific adrenal nodules. Musculoskeletal: Diffuse bony metastatic disease. Extra osseous extension surrounding the left T7 facet, diffusely at T9/10, left anterior T11. Mild T11 anterior compression deformity is stable. No acute osseous abnormality identified. Review of the MIP images confirms the above findings. IMPRESSION: 1. Respiratory motion artifact. No central, lobar, or proximal segmental pulmonary embolus. Downstream pulmonary arteries are poorly assessed due to motion artifact. 2. Interval diffuse increase in interlobular septal thickening and small bilateral pleural effusions compatible with pulmonary edema. 3. Mildly improved right perihilar consolidations which may represent postobstructive  pneumonitis or pneumonia. 4. Mild increased size of pulmonary metastasis. Stable extensive mediastinal and hilar adenopathy. 5. Grossly stable diffuse bony metastatic disease. 6. Stable nonspecific adrenal nodules, likely metastasis. 7. Background of severe emphysema, aortic atherosclerosis, coronary artery calcifications. Electronically Signed   By: Kristine Garbe M.D.   On: 04/22/2018 23:18   Ct Angio Chest Pe W Or Wo Contrast  Result  Date: 03/14/2018 CLINICAL DATA:  Increasing shortness of breath. PE suspected, high pretest prob EXAM: CT ANGIOGRAPHY CHEST WITH CONTRAST TECHNIQUE: Multidetector CT imaging of the chest was performed using the standard protocol during bolus administration of intravenous contrast. Multiplanar CT image reconstructions and MIPs were obtained to evaluate the vascular anatomy. CONTRAST:  167mL ISOVUE-370 IOPAMIDOL (ISOVUE-370) INJECTION 76% COMPARISON:  Multiple prior exams including radiographs earlier this day. Most recent chest CTA 3 weeks ago 02/21/2018 FINDINGS: Cardiovascular: Right chest port with tip in the SVC. There are no filling defects within the pulmonary arteries to suggest pulmonary embolus. Thoracic atherosclerosis without aneurysm or dissection. Coronary artery calcifications versus stents. Heart size is normal. No pericardial effusion. Mediastinum/Nodes: Definite progression of mediastinal adenopathy from prior exam with increased number and size of multiple mediastinal nodes. For example highest mediastinal node image 33 series 4 measures 12 mm short axis, previously 7 mm. Right lower paratracheal node, image 46, measures 2.1 cm, previously 1.8 cm. Right hilar adenopathy has progressed and now causes occlusion of the right middle lobe bronchus. Multiple small left hilar nodes are similar. Esophagus is nondilated. Bilateral gynecomastia. No visualized thyroid nodule. Lungs/Pleura: Advanced emphysema. Honeycombing and fibrosis at the bases again seen. Right hilar adenopathy causes complete right middle lobe collapse. Multiple pulmonary nodules. Definite increase in lingular nodule measuring 16 mm image 105 series 10, previously 9 mm. Previous right middle lobe nodule obscured by surrounding collapse. Right basilar lower lobe nodule measures 19 mm, image 117, unchanged. Multiple additional pulmonary nodules have also increased. Peripheral opacity in the left upper and lower lobes appears similar and is  consistent with scarring. Ill-defined right lower lobe perihilar opacities suggest partial collapse rather than pneumonia. No significant pleural fluid. No evidence of pulmonary edema. Upper Abdomen: Bilateral adrenal nodularity as before. Cholelithiasis. Irregular upper abdominal aortic atherosclerosis. No acute findings. Musculoskeletal: Diffuse osseous metastatic disease with mixed lytic and sclerotic lesions. Extraosseous soft tissue extension of metastasis involving left lamina at T7 invades the spinal canal. Also spinal canal involvement from lesion involving right lamina of T9. No acute fracture. Review of the MIP images confirms the above findings. IMPRESSION: 1. No pulmonary embolus. 2. Progression of metastatic disease in the thorax. Progression in right hilar adenopathy causes occlusion of the right middle lobe bronchus and right middle lobe collapse. Additional mediastinal nodes have also increased in size. Increased size of multiple pulmonary metastasis. 3. Diffuse osseous metastatic disease including extraosseous soft tissue involvement of the spinal canal at T7 and T9. 4. Right lower lobe perihilar consolidation may be postobstructive or infectious. 5. Advanced emphysema with basilar fibrosis. 6. Additional findings as described. Aortic Atherosclerosis (ICD10-I70.0) and Emphysema (ICD10-J43.9). Electronically Signed   By: Keith Rake M.D.   On: 03/14/2018 21:56   Dg Chest Port 1 View  Result Date: 04/04/2018 CLINICAL DATA:  Short of breath EXAM: PORTABLE CHEST 1 VIEW COMPARISON:  04/03/2018 FINDINGS: Underlying interstitial fibrosis. Superimposed bilateral airspace disease unchanged. No effusion. Port-A-Cath tip in the SVC unchanged. Prominent right hilum unchanged consistent with adenopathy. IMPRESSION: No interval change. Underlying interstitial fibrosis with superimposed diffuse bilateral airspace disease which  could be infection or edema. Electronically Signed   By: Franchot Gallo M.D.    On: 04/04/2018 07:41   Dg Chest Port 1 View  Result Date: 04/03/2018 CLINICAL DATA:  Shortness of breath EXAM: PORTABLE CHEST 1 VIEW COMPARISON:  03/29/2018 FINDINGS: Cardiac shadow is stable. Right chest wall port is again seen. Multiple left rib fractures are again noted. Stable chronic emphysematous changes are noted. Some mild vascular congestion and patchy infiltrative opacities are again identified bilaterally relatively stable from the prior exam. Portion of these are related to the known underlying metastatic disease. No acute bony abnormality is noted. IMPRESSION: Stable appearance of the chest when compared with the prior study. Electronically Signed   By: Inez Catalina M.D.   On: 04/03/2018 07:06    Time Spent in minutes 25   Jeyla Bulger M.D on 04/07/2018 at 12:38 PM  Between 7am to 7pm - Pager - [phone]  After 7pm go to [email or web address] - password [facility]  [facility] -  Office  [phone]

## 2018-04-07 NOTE — Progress Notes (Signed)
Unfortunately, I am not sure if we are really making a lot of progress with Mr. Rhames.  I know that he is trying his best.  He just cannot come off the high oxygen flow rate.  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  I did give him a dose of Lupron.  However, his family did not want to give this to him.  His labs today show his white cell count was 13.9.  Hemoglobin is 9.7 and platelet count 248,000.  His calcium is 8.7 with an albumin of 2.6.  His creatinine is 0.93.  He really is not eating much.  He is quite frail.  He has these panic attacks.  He is on Ativan for this.  I am going to increase his fentanyl patch to 25 mcg.  I do not know if this might help with anxiety.  May be, it could possibly help with bronchial dilation.  His last chest x-ray that was done 3 days ago showed the interstitial fibrosis with bilateral airspace disease which could be infection or edema.  I just wish that his breathing could improve so that his quality of life might get better.  It would be nice to get him out of the ICU but this is not possible as he requires too much oxygen, and this cannot be provided on the regular medical floor.  I think the morphine drip is not have bad idea.  This certainly could be adjusted with respect to its rate and how concentrated it needs to be.  I know that the staff in the ICU are continuing to do a fantastic job with him and trying to provide for his comfort and also in providing support to his family.  Lattie Haw, MD  Oswaldo Milian 35:4

## 2018-04-07 NOTE — Progress Notes (Signed)
Nutrition Follow-up  DOCUMENTATION CODES:   Severe malnutrition in context of chronic illness, Underweight  INTERVENTION:  - Continue Ensure Enlive TID. - Will monitor for GOC.   NUTRITION DIAGNOSIS:   Severe Malnutrition related to chronic illness, catabolic illness, cancer and cancer related treatments as evidenced by severe fat depletion, severe muscle depletion. -ongoing  GOAL:   Patient will meet greater than or equal to 90% of their needs -likely unmet  MONITOR:   PO intake, Supplement acceptance, Weight trends, Labs, Other (Comment)(GOC)  ASSESSMENT:   72 year old male with history of prostate cancer with bone metastases, COPD, on chronic prednisone therapy, AAA, hepatitis B, and GERD. He was admitted for COP exacerbation.  Per flow sheet, patient consumed 100% of breakfast 11/14. Review of order indicates that patient accepted 10 of 12 bottles of Ensure since order placed 11/11. Patient states that he did not eat this AM and does not want anything more to eat because he is "going upstairs to die, and I am okay with that." Patient reiterates several times that he is going upstairs to pass away.   Patient and wife, who is at beside, deny needing anything or having any questions at this time.   Medications reviewed; 20 mg oral Pepcid BID.  Labs reviewed; BUN: 38 mg/dL, Ca: 8.7 mg/dL.     Diet Order:   Diet Order            Diet regular Room service appropriate? Yes; Fluid consistency: Thin  Diet effective now              EDUCATION NEEDS:   No education needs have been identified at this time  Skin:  Skin Assessment: Reviewed RN Assessment  Last BM:  PTA/unknown  Height:   Ht Readings from Last 1 Encounters:  04/01/18 5\' 9"  (1.753 m)    Weight:   Wt Readings from Last 1 Encounters:  04/07/18 53 kg    Ideal Body Weight:  72.73 kg  BMI:  Body mass index is 17.25 kg/m.  Estimated Nutritional Needs:   Kcal:  1910-2180 kcal  Protein:   100-110 grams  Fluid:  >/= 1.9 L/day     Jarome Matin, MS, RD, LDN, [facility] Inpatient Clinical Dietitian Pager # [phone] After hours/weekend pager # [phone]

## 2018-04-08 LAB — BPAM RBC
BLOOD PRODUCT EXPIRATION DATE: 201912042359
BLOOD PRODUCT EXPIRATION DATE: 201912142359
Unit Type and Rh: 9500
Unit Type and Rh: 9500

## 2018-04-08 LAB — TYPE AND SCREEN
ABO/RH(D): O NEG
ANTIBODY SCREEN: NEGATIVE
UNIT DIVISION: 0
UNIT DIVISION: 0

## 2018-04-08 NOTE — Progress Notes (Signed)
Pt woke up and ripped his PortaCath access out of his chest. Nurse Covered site and paged IV team and notifed MD

## 2018-04-08 NOTE — Progress Notes (Signed)
PMT progress note  Patient appears frail and weak, even at baseline, appears to be with at least mild to moderate dyspnea, wife present at the bedside is tearful. He is now outside of the stepdown unit.   2 mg IV morphine drip had been initiated, now currently on 1mg /hour. Med history noted, he has required 2 mg IV bolus 3 times in the past 24 hours. Oral intake is minimal, reduced to sips now.   He opens his eyes and does respond appropriately.  BP 132/86 (BP Location: Left Arm)   Pulse (!) 110   Temp 98.3 F (36.8 C) (Oral)   Resp 18   Ht 5\' 9"  (1.753 m)   Wt 53 kg   SpO2 94%   BMI 17.25 kg/m   Labs and imaging noted.   Appears frail and weak. Few scattered rhonchi, also has wheezes anteriorly S 1 S 2 Awake alert but appears much weaker Abdomen not distended.   Acute on chronic hypoxic resp failure.  COPD exacerbation.  Metastatic prostate cancer.  Bone mets.  Possible lymphangitic spread.   Discussed with patient, cousin and wife at bedside.   Several family members to arrive today.   PPS 30%  Continue current mode of care. Has been started on morphine drip for comfort care  Ativan as needed for anxiety agitation.   Patient appropriate for transfer to residential hospice in my opinion.   Patient wishes to continue with comfort measures that are being employed here, in particular, states that the morphine helps him tremendously with shortness of breath and also helps with his anxiety and air hunger.   We re discussed again about residential hospice, patient is willing to consider. Discussed with wife about hospice facility care.   CSW will be consulted to help facilitate. Patient remains on Morphine drip, also on high flow O2. May need to continue with comfort measures at the hospital if no hospice beds. Patient and family not entirely on board with transfer, how ever, would like to meet and discuss directly with hospice liaison. Call placed and discussed with [facility]  liaison Lattie Haw. Appreciate input.    prognosis: few days to less than 2 weeks, in my opinion. This has been discussed frankly and compassionately with patient and family at bedside.   25 minutes spent.  Loistine Chance MD [facility] health palliative medicine team [phone]

## 2018-04-08 NOTE — Progress Notes (Signed)
[facility] Liaison RN Alexander Ellis) note  Received request from Alexander Ellis) for family interest in [facility]. Chart reviewed and spoke with family to acknowledge referral.  Unfortunately [name] is not able to offer a room today. Family (Alexander Ellis-spouse)and CSW [name]) are aware [facility] liaison will follow up with CSW and family tomorrow or sooner if room becomes available. Family has requested Hospice Liaison to visit pt tomorrow 11/17) and answer any questions or inquires concerning [employer]. Please do not hesitate to call with questions. Thank you. ?  Alexander Mina, RN, BSN [facility] Liaison  [place] are on AMION

## 2018-04-08 NOTE — Progress Notes (Signed)
PROGRESS NOTE                                                                                                                                                                                                             Patient Demographics:    Alexander Ellis, is a 72 y.o. male, DOB - [birth year], NLG:[id number]  Admit date - 03/25/2018   Admitting Physician Elwyn Reach, MD  Outpatient Primary MD for the patient is [name], NP  LOS - 8  Outpatient Specialists: Oncology (Dr. Marin Olp)  Chief Complaint  Patient presents with  . 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  Patient managed in stepdown unit with high flow nasal cannula (initially required BiPAP)   Subjective:   Placed on morphine drip for comfort and dose adjusted.  Per wife he slept much better last night.  Patient and family now interested in residential hospice.  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 Required BiPAP for several days, IV steroid and antibiotic.  Now continuously requiring high flow nasal cannula.  BiPAP only for comfort. Appreciate PCCM and palliative care consult.  Discontinued steroid due to persistent agitation.  Nebs as needed.   Overall prognosis is guarded.  Patient and family now agree on comfort.  Started on morphine drip on 11/15 and adjusted.  PRN Ativan for anxiety.  They are now interested in residential hospice and [facility] contacted.  No bed today.   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 Received 2 cycles of chemotherapy with Taxotere.  Now discontinued.  Will not tolerate chemotherapy given poor prognosis. Now on morphine drip.     Acute on chronic diastolic CHF with pulmonary edema Diuretics as needed.  Fluid discontinued and holding off on transfusion.     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  GERD Continue Pepcid  Severe protein calorie malnutrition Added supplement.        Code Status : DNR, comfort measures,   Family Communication  : Wife and daughter at bedside  Disposition Plan  : Residential hospice once bed available  Barriers For Discharge : Active symptoms  Consults  : Oncology, PCCM, palliative care  Procedures  : CT angiogram of the chest, 2D echo  DVT Prophylaxis  : Comfort measures  Lab Results  Component Value Date   PLT 248 04/07/2018    Antibiotics  :   Anti-infectives (From admission, onward)   Start     Dose/Rate Route Frequency Ordered Stop   04/01/18 2200  vancomycin (VANCOCIN) IVPB 750 mg/150 ml premix     750 mg 150 mL/hr over 60 Minutes Intravenous Every 24 hours 04/11/2018 2116 04/04/18 2303   04/01/18 0000  aztreonam (AZACTAM) 2 g in sodium chloride 0.9 % 100 mL IVPB     2 g 200 mL/hr over 30 Minutes Intravenous Every 8 hours 04/17/2018 2011 04/04/18 1552   04/05/2018 1745  vancomycin (VANCOCIN) IVPB 1000 mg/200 mL premix     1,000 mg 200 mL/hr over 60 Minutes Intravenous  Once 04/01/2018 1734 04/04/2018 2221   04/11/2018 1730  aztreonam (AZACTAM) 2 g in sodium chloride 0.9 % 100 mL IVPB     2 g 200 mL/hr over 30 Minutes Intravenous  Once 03/29/2018 1725 04/13/2018 1823        Objective:   Vitals:   04/07/18 1728 04/07/18 2002 04/07/18 2036 04/08/18 0208  BP: 125/68  132/86   Pulse: 94 98 (!) 110   Resp:  12 12 18   Temp:   98.3 F (36.8 C)   TempSrc:   Oral   SpO2:  (!) 86% (!) 89% 94%   Weight:      Height:        Wt Readings from Last 3 Encounters:  04/07/18 53 kg  03/28/18 54.4 kg  03/17/18 54.1 kg     Intake/Output Summary (Last 24 hours) at 04/08/2018 1407 Last data filed at 04/08/2018 0600 Gross per 24 hour  Intake 38 ml  Output -  Net 38 ml   Physical exam Fatigue HEENT: Moist mucosa, supple neck Continued on high flow nasal cannula Chest: Clear bilaterally CVs: Normal S1 and S2 GI: Soft, nondistended, nontender Muscular skeletal: Warm, no edema       Data Review:    CBC Recent Labs  Lab 04/04/18 1519 04/05/18 0404 04/06/18 0655 04/07/18 0614 04/07/18 0737  WBC 11.8* 12.7* 13.9* QUESTIONABLE RESULTS, RECOMMEND RECOLLECT TO VERIFY 13.9*  HGB 8.7* 9.0* 9.3* QUESTIONABLE RESULTS, RECOMMEND RECOLLECT TO VERIFY 9.7*  HCT 28.4* 28.7* 30.1* QUESTIONABLE RESULTS, RECOMMEND RECOLLECT TO VERIFY 31.2*  PLT 234 249 256 QUESTIONABLE RESULTS, RECOMMEND RECOLLECT TO VERIFY 248  MCV 109.7* 108.7* 109.1* QUESTIONABLE RESULTS, RECOMMEND RECOLLECT TO VERIFY 109.9*  MCH 33.6 34.1* 33.7 QUESTIONABLE RESULTS, RECOMMEND RECOLLECT TO VERIFY 34.2*  MCHC 30.6 31.4 30.9 QUESTIONABLE RESULTS, RECOMMEND RECOLLECT TO VERIFY 31.1  RDW 15.4 15.6* 15.4 QUESTIONABLE RESULTS, RECOMMEND RECOLLECT TO VERIFY 15.7*  LYMPHSABS 0.4* 0.6* 0.7 QUESTIONABLE RESULTS, RECOMMEND RECOLLECT TO VERIFY 0.8  MONOABS 0.3 0.6 0.6 QUESTIONABLE RESULTS, RECOMMEND RECOLLECT TO VERIFY 0.6  EOSABS 0.0 0.0 0.0 QUESTIONABLE RESULTS, RECOMMEND RECOLLECT TO VERIFY 0.0  BASOSABS 0.0 0.0 0.0 QUESTIONABLE RESULTS, RECOMMEND RECOLLECT TO VERIFY 0.0    Chemistries  Recent Labs  Lab 04/02/18 0510 04/03/18 0353 04/04/18 0347 04/05/18 0404 04/06/18 0543 04/06/18 0655 04/07/18  8101 04/07/18 0737  NA 135 138 138 138 QUESTIONABLE RESULTS, RECOMMEND RECOLLECT TO VERIFY 135 QUESTIONABLE RESULTS, RECOMMEND RECOLLECT TO VERIFY 139  K 4.6 4.7 5.0 4.6 QUESTIONABLE RESULTS,  RECOMMEND RECOLLECT TO VERIFY 4.6 QUESTIONABLE RESULTS, RECOMMEND RECOLLECT TO VERIFY 4.4  CL 100 105 103 101 QUESTIONABLE RESULTS, RECOMMEND RECOLLECT TO VERIFY 97* QUESTIONABLE RESULTS, RECOMMEND RECOLLECT TO VERIFY 101  CO2 25 24 26 28  QUESTIONABLE RESULTS, RECOMMEND RECOLLECT TO VERIFY 28 QUESTIONABLE RESULTS, RECOMMEND RECOLLECT TO VERIFY 27  GLUCOSE 127* 94 125* 126* QUESTIONABLE RESULTS, RECOMMEND RECOLLECT TO VERIFY 119* QUESTIONABLE RESULTS, RECOMMEND RECOLLECT TO VERIFY 126*  BUN 31* 34* 40* 44* QUESTIONABLE RESULTS, RECOMMEND RECOLLECT TO VERIFY 40* QUESTIONABLE RESULTS, RECOMMEND RECOLLECT TO VERIFY 38*  CREATININE 0.93 0.99 1.01 1.02 QUESTIONABLE RESULTS, RECOMMEND RECOLLECT TO VERIFY 0.94 QUESTIONABLE RESULTS, RECOMMEND RECOLLECT TO VERIFY 0.93  CALCIUM 8.3* 8.5* 8.6* 8.8* QUESTIONABLE RESULTS, RECOMMEND RECOLLECT TO VERIFY 8.8* QUESTIONABLE RESULTS, RECOMMEND RECOLLECT TO VERIFY 8.7*  MG 2.3 2.4 2.4  --   --   --   --   --   AST 22 17 18 19  QUESTIONABLE RESULTS, RECOMMEND RECOLLECT TO VERIFY 20 QUESTIONABLE RESULTS, RECOMMEND RECOLLECT TO VERIFY 23  ALT 20 16 17 16  QUESTIONABLE RESULTS, RECOMMEND RECOLLECT TO VERIFY 14 QUESTIONABLE RESULTS, RECOMMEND RECOLLECT TO VERIFY 16  ALKPHOS 36* 34* 37* 38 QUESTIONABLE RESULTS, RECOMMEND RECOLLECT TO VERIFY 41 QUESTIONABLE RESULTS, RECOMMEND RECOLLECT TO VERIFY 52  BILITOT 0.3 0.4 0.5 0.5 QUESTIONABLE RESULTS, RECOMMEND RECOLLECT TO VERIFY 0.8 QUESTIONABLE RESULTS, RECOMMEND RECOLLECT TO VERIFY 0.8   ------------------------------------------------------------------------------------------------------------------ No results for input(s): CHOL, HDL, LDLCALC, TRIG, CHOLHDL, LDLDIRECT in the last 72 hours.  No results found for: HGBA1C ------------------------------------------------------------------------------------------------------------------ No results for input(s): TSH, T4TOTAL, T3FREE, THYROIDAB in the last 72 hours.  Invalid input(s):  FREET3 ------------------------------------------------------------------------------------------------------------------ No results for input(s): VITAMINB12, FOLATE, FERRITIN, TIBC, IRON, RETICCTPCT in the last 72 hours.  Coagulation profile No results for input(s): INR, PROTIME in the last 168 hours.  No results for input(s): DDIMER in the last 72 hours.  Cardiac Enzymes Recent Labs  Lab 04/01/18 1834 04/02/18 0057 04/02/18 0510  TROPONINI <0.03 <0.03 <0.03   ------------------------------------------------------------------------------------------------------------------    Component Value Date/Time   BNP 93.0 03/27/2018 1611    Inpatient Medications  Scheduled Meds: . sodium chloride   Intravenous Once  . chlorhexidine  15 mL Mouth/Throat BID  . chlorhexidine  15 mL Mouth Rinse BID  . Chlorhexidine Gluconate Cloth  6 each Topical Q0600  . dorzolamide-timolol  1 drop Left Eye BID  . famotidine  20 mg Oral BID  . feeding supplement (ENSURE ENLIVE)  237 mL Oral TID BM  . fentaNYL  25 mcg Transdermal Q72H  . guaiFENesin  1,200 mg Oral BID  . mouth rinse  15 mL Mouth Rinse q12n4p  . prednisoLONE acetate  1 drop Left Eye QID   Continuous Infusions: . morphine 1 mg/hr (04/07/18 1835)   PRN Meds:.acetaminophen, ipratropium-albuterol, lip balm, LORazepam, MUSCLE RUB, ondansetron, sodium chloride flush  Micro Results Recent Results (from the past 240 hour(s))  Culture, blood (routine x 2) Call MD if unable to obtain prior to antibiotics being given     Status: None   Collection Time: 03/27/2018  8:28 PM  Result Value Ref Range Status   Specimen Description   Final    BLOOD LEFT Performed at [facility] [address], [place], [place] [address]    Special Requests   Final    BOTTLES DRAWN AEROBIC ONLY Blood  Culture adequate volume Performed at [facility] [address] [place], [place] [address]    Culture   Final    NO  GROWTH 5 DAYS Performed at [facility], [place] [address] [place], [place] [address]    Report Status 04/05/2018 FINAL  Final  Culture, blood (routine x 2) Call MD if unable to obtain prior to antibiotics being given     Status: None   Collection Time: 04/12/2018  8:29 PM  Result Value Ref Range Status   Specimen Description   Final    BLOOD LEFT Performed at [facility] [address] [place], [place] [address]    Special Requests   Final    BOTTLES DRAWN AEROBIC ONLY Blood Culture adequate volume Performed at [facility] [address], [place], [place] [address]    Culture   Final    NO GROWTH 5 DAYS Performed at [facility], [place] [address], [place], [place] [address]    Report Status 04/05/2018 FINAL  Final  Expectorated sputum assessment w rflx to resp cult     Status: None   Collection Time: 04/01/18  5:57 PM  Result Value Ref Range Status   Specimen Description SPUTUM  Final   Special Requests NONE  Final   Sputum evaluation   Final    Sputum specimen not acceptable for testing.  Please recollect.   NOTIFIED ARNOLD,A RN @1909  ON 04/01/18 JACKSON,K Performed at [facility], [place] [address] [place], [place] [address]    Report Status 04/01/2018 FINAL  Final  MRSA PCR Screening     Status: None   Collection Time: 04/01/18  6:40 PM  Result Value Ref Range Status   MRSA by PCR NEGATIVE NEGATIVE Final    Comment:        The GeneXpert MRSA Assay (FDA approved for NASAL specimens only), is one component of a comprehensive MRSA colonization surveillance program. It is not intended to diagnose MRSA infection nor to guide or monitor treatment for MRSA infections. Performed at [facility], [place] [address], [place], [place] [address]   Expectorated sputum assessment w rflx to resp cult     Status: None   Collection Time: 04/04/18  8:56 AM  Result Value Ref Range Status   Specimen  Description SPUTUM  Final   Special Requests Normal  Final   Sputum evaluation   Final    THIS SPECIMEN IS ACCEPTABLE FOR SPUTUM CULTURE Performed at [facility], [place] [address] [place], [place] [address]    Report Status 04/04/2018 FINAL  Final  Culture, respiratory     Status: None   Collection Time: 04/04/18  8:56 AM  Result Value Ref Range Status   Specimen Description   Final    SPUTUM Performed at [facility] [address], [place], [place] [address]    Special Requests   Final    Normal Reflexed from [phone] Performed at [facility], [place] [address] [place], [place] [address]    Gram Stain   Final    RARE WBC PRESENT, PREDOMINANTLY PMN RARE GRAM POSITIVE COCCI    Culture   Final    FEW Consistent with normal respiratory flora. Performed at [facility], [place] [address] [place],  [address]    Report Status 04/06/2018 FINAL  Final    Radiology Reports Dg Chest 2 View  Result Date: 04/15/2018 CLINICAL  DATA:  Shortness of breath EXAM: CHEST - 2 VIEW COMPARISON:  03/19/2018 FINDINGS: Porta catheter on the right with tip at the SVC. There is extensive bilateral interstitial and airspace opacity with progressed density in the right mid lung since prior. No effusion or pneumothorax. Normal heart size and mediastinal contours.  Remote rib fractures. IMPRESSION: 1. Increased opacity in the right mid lung, presumably pneumonia. 2. Background of severe emphysema. Electronically Signed   By: Monte Fantasia M.D.   On: 04/02/2018 15:52   X-ray Chest Pa And Lateral  Result Date: 03/14/2018 CLINICAL DATA:  Respiratory failure EXAM: CHEST - 2 VIEW COMPARISON:  CT 02/21/2018, radiograph 02/21/2018, 01/27/2018 FINDINGS: Right-sided central venous port tip over the SVC. Emphysematous disease and bilateral fibrosis. Progressed consolidation at the right middle lobe and right base. No pleural effusion. Bilateral lung  nodules corresponding to pulmonary metastatic disease. Old left-sided rib fractures. Stable cardiomediastinal silhouette. Multifocal sclerosis at the ribs and spine. IMPRESSION: 1. Progressed airspace disease at the right middle lobe and right base, may reflect atelectasis or pneumonia superimposed on chronic interstitial disease 2. Diffuse fibrosis and emphysematous disease. Bilateral lung nodules corresponding to history of metastatic pulmonary nodules. 3. Multifocal sclerosis consistent with osseous metastatic disease Electronically Signed   By: Donavan Foil M.D.   On: 03/14/2018 16:12   Ct Angio Chest Pe W/cm &/or Wo Cm  Result Date: 04/05/2018 CLINICAL DATA:  72 y/o M; shortness of breath and dyspnea. Diagnose 2 weeks ago for pneumonia. Metastatic cancer. EXAM: CT ANGIOGRAPHY CHEST WITH CONTRAST TECHNIQUE: Multidetector CT imaging of the chest was performed using the standard protocol during bolus administration of intravenous contrast. Multiplanar CT image reconstructions and MIPs were obtained to evaluate the vascular anatomy. CONTRAST:  137mL ISOVUE-370 IOPAMIDOL (ISOVUE-370) INJECTION 76% COMPARISON:  03/14/2018 CT chest. FINDINGS: Cardiovascular: Satisfactory opacification of the pulmonary arteries. Respiratory motion artifact. No central, lobar, or proximal segmental pulmonary embolus. Downstream pulmonary arteries are poorly assessed due to motion artifact. Normal caliber thoracic aorta and main pulmonary artery. Mild calcific atherosclerosis of the aorta and moderate coronary artery calcific atherosclerosis. Normal heart size. No pericardial effusion. Mediastinum/Nodes: Stable extensive mediastinal and hilar adenopathy, for example a right lower paratracheal node measuring 21 mm short axis (series 4, image 39) and a left prevascular node measuring 12 mm (series 4, image 25). Patent central airways. Normal thoracic esophagus. Lungs/Pleura: Interval diffuse increase in interlobular septal thickening  and small bilateral pleural effusions. Pulmonary metastasis are increased in size, for example a nodule in the left upper lobe lingula measuring 20 mm, previously 16 mm (series 6, image 90) and a nodule at the right lung base measuring 25 mm, previously 19 mm (series 6, image 111). Right perihilar consolidations are decreased in comparison with the prior study. Background of severe emphysema. Upper Abdomen: Cholelithiasis. Partially visualized renal cyst. Stable bilateral nonspecific adrenal nodules. Musculoskeletal: Diffuse bony metastatic disease. Extra osseous extension surrounding the left T7 facet, diffusely at T9/10, left anterior T11. Mild T11 anterior compression deformity is stable. No acute osseous abnormality identified. Review of the MIP images confirms the above findings. IMPRESSION: 1. Respiratory motion artifact. No central, lobar, or proximal segmental pulmonary embolus. Downstream pulmonary arteries are poorly assessed due to motion artifact. 2. Interval diffuse increase in interlobular septal thickening and small bilateral pleural effusions compatible with pulmonary edema. 3. Mildly improved right perihilar consolidations which may represent postobstructive pneumonitis or pneumonia. 4. Mild increased size of pulmonary metastasis. Stable extensive mediastinal and hilar adenopathy. 5. Grossly stable diffuse  bony metastatic disease. 6. Stable nonspecific adrenal nodules, likely metastasis. 7. Background of severe emphysema, aortic atherosclerosis, coronary artery calcifications. Electronically Signed   By: Kristine Garbe M.D.   On: 04/19/2018 23:18   Ct Angio Chest Pe W Or Wo Contrast  Result Date: 03/14/2018 CLINICAL DATA:  Increasing shortness of breath. PE suspected, high pretest prob EXAM: CT ANGIOGRAPHY CHEST WITH CONTRAST TECHNIQUE: Multidetector CT imaging of the chest was performed using the standard protocol during bolus administration of intravenous contrast. Multiplanar CT  image reconstructions and MIPs were obtained to evaluate the vascular anatomy. CONTRAST:  123mL ISOVUE-370 IOPAMIDOL (ISOVUE-370) INJECTION 76% COMPARISON:  Multiple prior exams including radiographs earlier this day. Most recent chest CTA 3 weeks ago 02/21/2018 FINDINGS: Cardiovascular: Right chest port with tip in the SVC. There are no filling defects within the pulmonary arteries to suggest pulmonary embolus. Thoracic atherosclerosis without aneurysm or dissection. Coronary artery calcifications versus stents. Heart size is normal. No pericardial effusion. Mediastinum/Nodes: Definite progression of mediastinal adenopathy from prior exam with increased number and size of multiple mediastinal nodes. For example highest mediastinal node image 33 series 4 measures 12 mm short axis, previously 7 mm. Right lower paratracheal node, image 46, measures 2.1 cm, previously 1.8 cm. Right hilar adenopathy has progressed and now causes occlusion of the right middle lobe bronchus. Multiple small left hilar nodes are similar. Esophagus is nondilated. Bilateral gynecomastia. No visualized thyroid nodule. Lungs/Pleura: Advanced emphysema. Honeycombing and fibrosis at the bases again seen. Right hilar adenopathy causes complete right middle lobe collapse. Multiple pulmonary nodules. Definite increase in lingular nodule measuring 16 mm image 105 series 10, previously 9 mm. Previous right middle lobe nodule obscured by surrounding collapse. Right basilar lower lobe nodule measures 19 mm, image 117, unchanged. Multiple additional pulmonary nodules have also increased. Peripheral opacity in the left upper and lower lobes appears similar and is consistent with scarring. Ill-defined right lower lobe perihilar opacities suggest partial collapse rather than pneumonia. No significant pleural fluid. No evidence of pulmonary edema. Upper Abdomen: Bilateral adrenal nodularity as before. Cholelithiasis. Irregular upper abdominal aortic  atherosclerosis. No acute findings. Musculoskeletal: Diffuse osseous metastatic disease with mixed lytic and sclerotic lesions. Extraosseous soft tissue extension of metastasis involving left lamina at T7 invades the spinal canal. Also spinal canal involvement from lesion involving right lamina of T9. No acute fracture. Review of the MIP images confirms the above findings. IMPRESSION: 1. No pulmonary embolus. 2. Progression of metastatic disease in the thorax. Progression in right hilar adenopathy causes occlusion of the right middle lobe bronchus and right middle lobe collapse. Additional mediastinal nodes have also increased in size. Increased size of multiple pulmonary metastasis. 3. Diffuse osseous metastatic disease including extraosseous soft tissue involvement of the spinal canal at T7 and T9. 4. Right lower lobe perihilar consolidation may be postobstructive or infectious. 5. Advanced emphysema with basilar fibrosis. 6. Additional findings as described. Aortic Atherosclerosis (ICD10-I70.0) and Emphysema (ICD10-J43.9). Electronically Signed   By: Keith Rake M.D.   On: 03/14/2018 21:56   Dg Chest Port 1 View  Result Date: 04/04/2018 CLINICAL DATA:  Short of breath EXAM: PORTABLE CHEST 1 VIEW COMPARISON:  04/03/2018 FINDINGS: Underlying interstitial fibrosis. Superimposed bilateral airspace disease unchanged. No effusion. Port-A-Cath tip in the SVC unchanged. Prominent right hilum unchanged consistent with adenopathy. IMPRESSION: No interval change. Underlying interstitial fibrosis with superimposed diffuse bilateral airspace disease which could be infection or edema. Electronically Signed   By: Franchot Gallo M.D.   On: 04/04/2018 07:41  Dg Chest Port 1 View  Result Date: 04/03/2018 CLINICAL DATA:  Shortness of breath EXAM: PORTABLE CHEST 1 VIEW COMPARISON:  04/21/2018 FINDINGS: Cardiac shadow is stable. Right chest wall port is again seen. Multiple left rib fractures are again noted. Stable  chronic emphysematous changes are noted. Some mild vascular congestion and patchy infiltrative opacities are again identified bilaterally relatively stable from the prior exam. Portion of these are related to the known underlying metastatic disease. No acute bony abnormality is noted. IMPRESSION: Stable appearance of the chest when compared with the prior study. Electronically Signed   By: Inez Catalina M.D.   On: 04/03/2018 07:06    Time Spent in minutes 20   Fusaye Wachtel M.D on 04/08/2018 at 2:07 PM  Between 7am to 7pm - Pager - [phone]  After 7pm go to [email or web address] - password [facility]  [facility] -  Office  [phone]

## 2018-04-08 NOTE — Progress Notes (Signed)
Clinical Social Worker following patient and family for support. CSW received consult to assist in placing patient into a residential hospice facility. CSW reached out to [facility] and left a voicemail for there liaison to reach out to family about bed availability. CSW will continue to follow for support.  Rhea Pink, MSW,  [place]

## 2018-04-09 MED ORDER — ZOLPIDEM TARTRATE 5 MG PO TABS: 5.0000 mg | ORAL_TABLET | Freq: Every evening | ORAL | Status: DC | PRN

## 2018-04-09 NOTE — Progress Notes (Addendum)
CSW called [place] at [facility] and spoke to Winslow Zachar who stated that as of right now there are no beds available but that the pt would like to speak to Lattie Haw and Lattie Haw stated Lattie Haw would speak to the patient today in person.  Dr. Rowe Pavy is aware that the pt would need to be on less than the 40 liters of oxygen that the pt is on currently.  Per Lattie Haw, the most that [facility] can accommodate is 15 liters of oxygen.  Lattie Haw stated she would call Dr. Rowe Pavy to ensure the MD is aware.  Lattie Haw at [facility] stated she would call the CSW back once a plan is established.  Of note: As of this morning there are no beds available at [facility], per [place] in admissions.  CSW will continue to follow for D/C needs.  Alphonse Guild. Abhinav Mayorquin, LCSW, LCAS, CSI Clinical Social Worker Ph: [phone]

## 2018-04-09 NOTE — Progress Notes (Signed)
[place] ([facility]) Visit @0945   Received a request from [place] for family interested in [facility] and would like additional information concerning services. Chart reviewed and received report from beside RN. Met at bedside with pt and other family members (son-Michael and his wife) and confirmed an interest however with certain request. Family requested pt maintain his oxygen to be delivered on the 40 liters (current).  Pt is eligible for [facility] but unable to accommodate his oxygen status on the 40 liters. RN has updated CSW Pharmacist, community) who has informed the provider and Dorothey Baseman has been place notified.   [facility] will be available to readdress if current status changes. Please call with any Hospice related questions.  Thank you  Vicente Serene, [facility] Liaison [place] are on AMION

## 2018-04-09 NOTE — Progress Notes (Addendum)
CSW received a call from [place] at [facility] who states pt desires:  1. Oxygen at 40% (with mask) 2. Morphine Drip (morphine drip will be available at [facility] on 11/18)  Of note: There IS a bed available today at [facility]   Unless the pt can wean down to 25% oxygen, [facility] cannot accommodate the pt.   Lattie Haw at [employer] aware, provider aware.  CSW will speak to family to determine alternate D/C. Plan.  CSW will continue to follow for D/C needs.  Alphonse Guild. Thelbert Gartin, LCSW, LCAS, CSI Clinical Social Worker Ph: [phone]

## 2018-04-09 NOTE — Progress Notes (Signed)
PROGRESS NOTE                                                                                                                                                                                                             Patient Demographics:    Alexander Ellis, is a 72 y.o. male, DOB - July 27, 1945, FOY:[id number]  Admit date - 04/07/2018   Admitting Physician Elwyn Reach, MD  Outpatient Primary MD for the patient is [name], NP  LOS - 9  Outpatient Specialists: Oncology (Dr. Marin Olp)  Chief Complaint  Patient presents with  . 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  Patient managed in stepdown unit with high flow nasal cannula (initially required BiPAP).   Subjective:   Breathing better on morphine drip but reports it was unable to sleep well last night.  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 Required BiPAP for several days, IV steroid and antibiotic.  Now continuously requiring high flow nasal cannula.   Appreciate PCCM and palliative care consult.  Discontinued steroid due to persistent agitation.  Nebs as needed.   Overall prognosis is guarded.  Patient and family now agree on comfort.  Started on morphine drip on 11/15 and adjusted.  PRN Ativan for anxiety.  They are now interested in residential hospice and [facility] contacted.  They do have a bed but family request being maintained on current oxygen (40% high flow).  [facility] can only manage  up to 25%.  Will see if we can wean oxygen tomorrow and discuss further with patient and family if unable to wean.   Active problems Healthcare associated pneumonia ([place]) Completed 5-day course of antibiotics  Metastatic prostate cancer to the bone and the lungs. Received 2 cycles of chemotherapy with Taxotere.  Now discontinued.  Will not tolerate chemotherapy given poor prognosis. Now on morphine drip.     Acute on  chronic diastolic CHF with pulmonary edema Diuretics as needed.  Fluid discontinued and holding off on transfusion.     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  GERD Continue Pepcid  Severe protein calorie malnutrition Added supplement.        Code Status : DNR, comfort measures,   Family Communication  : Son and daughter-in-law at bedside  Disposition Plan  : Residential hospice possibly tomorrow if able to wean down oxygen.  Barriers For Discharge : Active symptoms  Consults  : Oncology, PCCM, palliative care  Procedures  : CT angiogram of the chest, 2D echo  DVT Prophylaxis  : Comfort measures  Lab Results  Component Value Date   PLT 248 04/07/2018    Antibiotics  :   Anti-infectives (From admission, onward)   Start     Dose/Rate Route Frequency Ordered Stop   04/01/18 2200  vancomycin (VANCOCIN) IVPB 750 mg/150 ml premix     750 mg 150 mL/hr over 60 Minutes Intravenous Every 24 hours 04/19/2018 2116 04/04/18 2303   04/01/18 0000  aztreonam (AZACTAM) 2 g in sodium chloride 0.9 % 100 mL IVPB     2 g 200 mL/hr over 30 Minutes Intravenous Every 8 hours 04/21/2018 2011 04/04/18 1552   03/25/2018 1745  vancomycin (VANCOCIN) IVPB 1000 mg/200 mL premix     1,000 mg 200 mL/hr over 60 Minutes Intravenous  Once 04/08/2018 1734 03/29/2018 2221   04/12/2018 1730  aztreonam (AZACTAM) 2 g in sodium chloride 0.9 % 100 mL IVPB     2 g 200 mL/hr over 30 Minutes Intravenous  Once 04/09/2018 1725 04/21/2018 1823        Objective:   Vitals:   04/07/18 2002  04/07/18 2036 04/08/18 0208 04/08/18 2104  BP:  132/86  (!) 96/58  Pulse: 98 (!) 110  (!) 101  Resp: 12 12 18 13   Temp:  98.3 F (36.8 C)  97.6 F (36.4 C)  TempSrc:  Oral  Oral  SpO2: (!) 89% 94%  93%  Weight:      Height:        Wt Readings from Last 3 Encounters:  04/07/18 53 kg  03/28/18 54.4 kg  03/17/18 54.1 kg     Intake/Output Summary (Last 24 hours) at 04/09/2018 1224 Last data filed at 04/08/2018 1700 Gross per 24 hour  Intake -  Output 300 ml  Net -300 ml   Physical exam Fatigued HEENT moist mucosa, supple neck Chest: Coarse breath sounds bilaterally CVs: Normal S1-S2 GI: Soft, nondistended, nontender Musculoskeletal: Warm, no edema       Data Review:    CBC Recent Labs  Lab 04/04/18 1519 04/05/18 0404 04/06/18 0655 04/07/18 0614 04/07/18 0737  WBC 11.8* 12.7* 13.9* QUESTIONABLE RESULTS, RECOMMEND RECOLLECT TO VERIFY 13.9*  HGB 8.7* 9.0* 9.3* QUESTIONABLE RESULTS, RECOMMEND RECOLLECT TO VERIFY 9.7*  HCT 28.4* 28.7* 30.1* QUESTIONABLE RESULTS, RECOMMEND RECOLLECT TO VERIFY 31.2*  PLT 234 249 256 QUESTIONABLE RESULTS, RECOMMEND RECOLLECT TO VERIFY 248  MCV 109.7* 108.7* 109.1* QUESTIONABLE RESULTS, RECOMMEND RECOLLECT TO VERIFY 109.9*  MCH 33.6 34.1* 33.7 QUESTIONABLE RESULTS, RECOMMEND RECOLLECT TO VERIFY 34.2*  MCHC 30.6 31.4 30.9 QUESTIONABLE RESULTS, RECOMMEND RECOLLECT TO VERIFY 31.1  RDW 15.4 15.6* 15.4 QUESTIONABLE RESULTS, RECOMMEND RECOLLECT TO VERIFY 15.7*  LYMPHSABS 0.4* 0.6* 0.7 QUESTIONABLE RESULTS, RECOMMEND RECOLLECT TO VERIFY 0.8  MONOABS 0.3 0.6 0.6 QUESTIONABLE RESULTS, RECOMMEND RECOLLECT TO VERIFY 0.6  EOSABS 0.0 0.0 0.0 QUESTIONABLE RESULTS, RECOMMEND RECOLLECT TO VERIFY 0.0  BASOSABS 0.0 0.0 0.0  QUESTIONABLE RESULTS, RECOMMEND RECOLLECT TO VERIFY 0.0    Chemistries  Recent Labs  Lab 04/03/18 0353 04/04/18 0347 04/05/18 0404 04/06/18 0543 04/06/18 0655 04/07/18 0614 04/07/18 0737  NA 138 138 138 QUESTIONABLE  RESULTS, RECOMMEND RECOLLECT TO VERIFY 135 QUESTIONABLE RESULTS, RECOMMEND RECOLLECT TO VERIFY 139  K 4.7 5.0 4.6 QUESTIONABLE RESULTS, RECOMMEND RECOLLECT TO VERIFY 4.6 QUESTIONABLE RESULTS, RECOMMEND RECOLLECT TO VERIFY 4.4  CL 105 103 101 QUESTIONABLE RESULTS, RECOMMEND RECOLLECT TO VERIFY 97* QUESTIONABLE RESULTS, RECOMMEND RECOLLECT TO VERIFY 101  CO2 24 26 28  QUESTIONABLE RESULTS, RECOMMEND RECOLLECT TO VERIFY 28 QUESTIONABLE RESULTS, RECOMMEND RECOLLECT TO VERIFY 27  GLUCOSE 94 125* 126* QUESTIONABLE RESULTS, RECOMMEND RECOLLECT TO VERIFY 119* QUESTIONABLE RESULTS, RECOMMEND RECOLLECT TO VERIFY 126*  BUN 34* 40* 44* QUESTIONABLE RESULTS, RECOMMEND RECOLLECT TO VERIFY 40* QUESTIONABLE RESULTS, RECOMMEND RECOLLECT TO VERIFY 38*  CREATININE 0.99 1.01 1.02 QUESTIONABLE RESULTS, RECOMMEND RECOLLECT TO VERIFY 0.94 QUESTIONABLE RESULTS, RECOMMEND RECOLLECT TO VERIFY 0.93  CALCIUM 8.5* 8.6* 8.8* QUESTIONABLE RESULTS, RECOMMEND RECOLLECT TO VERIFY 8.8* QUESTIONABLE RESULTS, RECOMMEND RECOLLECT TO VERIFY 8.7*  MG 2.4 2.4  --   --   --   --   --   AST 17 18 19  QUESTIONABLE RESULTS, RECOMMEND RECOLLECT TO VERIFY 20 QUESTIONABLE RESULTS, RECOMMEND RECOLLECT TO VERIFY 23  ALT 16 17 16  QUESTIONABLE RESULTS, RECOMMEND RECOLLECT TO VERIFY 14 QUESTIONABLE RESULTS, RECOMMEND RECOLLECT TO VERIFY 16  ALKPHOS 34* 37* 38 QUESTIONABLE RESULTS, RECOMMEND RECOLLECT TO VERIFY 41 QUESTIONABLE RESULTS, RECOMMEND RECOLLECT TO VERIFY 52  BILITOT 0.4 0.5 0.5 QUESTIONABLE RESULTS, RECOMMEND RECOLLECT TO VERIFY 0.8 QUESTIONABLE RESULTS, RECOMMEND RECOLLECT TO VERIFY 0.8   ------------------------------------------------------------------------------------------------------------------ No results for input(s): CHOL, HDL, LDLCALC, TRIG, CHOLHDL, LDLDIRECT in the last 72 hours.  No results found for: HGBA1C ------------------------------------------------------------------------------------------------------------------ No  results for input(s): TSH, T4TOTAL, T3FREE, THYROIDAB in the last 72 hours.  Invalid input(s): FREET3 ------------------------------------------------------------------------------------------------------------------ No results for input(s): VITAMINB12, FOLATE, FERRITIN, TIBC, IRON, RETICCTPCT in the last 72 hours.  Coagulation profile No results for input(s): INR, PROTIME in the last 168 hours.  No results for input(s): DDIMER in the last 72 hours.  Cardiac Enzymes No results for input(s): CKMB, TROPONINI, MYOGLOBIN in the last 168 hours.  Invalid input(s): CK ------------------------------------------------------------------------------------------------------------------    Component Value Date/Time   BNP 93.0 04/07/2018 1611    Inpatient Medications  Scheduled Meds: . sodium chloride   Intravenous Once  . chlorhexidine  15 mL Mouth/Throat BID  . chlorhexidine  15 mL Mouth Rinse BID  . Chlorhexidine Gluconate Cloth  6 each Topical Q0600  . dorzolamide-timolol  1 drop Left Eye BID  . famotidine  20 mg Oral BID  . feeding supplement (ENSURE ENLIVE)  237 mL Oral TID BM  . fentaNYL  25 mcg Transdermal Q72H  . guaiFENesin  1,200 mg Oral BID  . mouth rinse  15 mL Mouth Rinse q12n4p  . prednisoLONE acetate  1 drop Left Eye QID   Continuous Infusions: . morphine 2 mg/hr (04/08/18 1824)   PRN Meds:.acetaminophen, ipratropium-albuterol, lip balm, LORazepam, MUSCLE RUB, ondansetron, sodium chloride flush  Micro Results Recent Results (from the past 240 hour(s))  Culture, blood (routine x 2) Call MD if unable to obtain prior to antibiotics being given     Status: None   Collection Time: 04/15/2018  8:28 PM  Result Value Ref Range Status   Specimen Description   Final    BLOOD LEFT Performed at [facility] [address] [place],  [address]  Special Requests   Final    BOTTLES DRAWN AEROBIC ONLY Blood Culture adequate volume Performed at [facility] [address], [place], [place] [address]    Culture   Final    NO GROWTH 5 DAYS Performed at [facility], [place] [address] [place], [place] [address]    Report Status 04/05/2018 FINAL  Final  Culture, blood (routine x 2) Call MD if unable to obtain prior to antibiotics being given     Status: None   Collection Time: 03/28/2018  8:29 PM  Result Value Ref Range Status   Specimen Description   Final    BLOOD LEFT Performed at [facility] [address] [place], [place] [address]    Special Requests   Final    BOTTLES DRAWN AEROBIC ONLY Blood Culture adequate volume Performed at [facility] [address] [place], [place] [address]    Culture   Final    NO GROWTH 5 DAYS Performed at [facility], [place] [address] [place], [place] [address]    Report Status 04/05/2018 FINAL  Final  Expectorated sputum assessment w rflx to resp cult     Status: None   Collection Time: 04/01/18  5:57 PM  Result Value Ref Range Status   Specimen Description SPUTUM  Final   Special Requests NONE  Final   Sputum evaluation   Final    Sputum specimen not acceptable for testing.  Please recollect.   NOTIFIED ARNOLD,A RN @1909  ON 04/01/18 JACKSON,K Performed at [facility], [place] [address] [place], [place] [address]    Report Status 04/01/2018 FINAL  Final  MRSA PCR Screening     Status: None   Collection Time: 04/01/18  6:40 PM  Result Value Ref Range Status   MRSA by PCR NEGATIVE NEGATIVE Final    Comment:        The GeneXpert MRSA Assay (FDA approved for NASAL specimens only), is one component of a comprehensive MRSA colonization surveillance program. It is not intended to diagnose MRSA infection nor to guide or monitor treatment for MRSA infections. Performed at [facility], [place] [address] [place] [address], [place], [place] [address]   Expectorated sputum assessment w rflx to resp cult      Status: None   Collection Time: 04/04/18  8:56 AM  Result Value Ref Range Status   Specimen Description SPUTUM  Final   Special Requests Normal  Final   Sputum evaluation   Final    THIS SPECIMEN IS ACCEPTABLE FOR SPUTUM CULTURE Performed at [facility], [place] [address] [place], [place] [address]    Report Status 04/04/2018 FINAL  Final  Culture, respiratory     Status: None   Collection Time: 04/04/18  8:56 AM  Result Value Ref Range Status   Specimen Description   Final    SPUTUM Performed at [facility] [address], [place], [place] [address]    Special Requests   Final    Normal Reflexed from [phone] Performed at [facility], [place] [address], [place], [place] [address]    Gram Stain   Final    RARE WBC PRESENT, PREDOMINANTLY PMN RARE GRAM POSITIVE COCCI    Culture   Final    FEW Consistent with normal respiratory flora. Performed at [facility], [place] [address], [place],  [address]    Report Status 04/06/2018 FINAL  Final  Radiology Reports Dg Chest 2 View  Result Date: 04/06/2018 CLINICAL DATA:  Shortness of breath EXAM: CHEST - 2 VIEW COMPARISON:  03/19/2018 FINDINGS: Porta catheter on the right with tip at the SVC. There is extensive bilateral interstitial and airspace opacity with progressed density in the right mid lung since prior. No effusion or pneumothorax. Normal heart size and mediastinal contours.  Remote rib fractures. IMPRESSION: 1. Increased opacity in the right mid lung, presumably pneumonia. 2. Background of severe emphysema. Electronically Signed   By: Monte Fantasia M.D.   On: 04/13/2018 15:52   X-ray Chest Pa And Lateral  Result Date: 03/14/2018 CLINICAL DATA:  Respiratory failure EXAM: CHEST - 2 VIEW COMPARISON:  CT 02/21/2018, radiograph 02/21/2018, 01/27/2018 FINDINGS: Right-sided central venous port tip over the SVC. Emphysematous disease and bilateral fibrosis. Progressed  consolidation at the right middle lobe and right base. No pleural effusion. Bilateral lung nodules corresponding to pulmonary metastatic disease. Old left-sided rib fractures. Stable cardiomediastinal silhouette. Multifocal sclerosis at the ribs and spine. IMPRESSION: 1. Progressed airspace disease at the right middle lobe and right base, may reflect atelectasis or pneumonia superimposed on chronic interstitial disease 2. Diffuse fibrosis and emphysematous disease. Bilateral lung nodules corresponding to history of metastatic pulmonary nodules. 3. Multifocal sclerosis consistent with osseous metastatic disease Electronically Signed   By: Donavan Foil M.D.   On: 03/14/2018 16:12   Ct Angio Chest Pe W/cm &/or Wo Cm  Result Date: 04/17/2018 CLINICAL DATA:  72 y/o M; shortness of breath and dyspnea. Diagnose 2 weeks ago for pneumonia. Metastatic cancer. EXAM: CT ANGIOGRAPHY CHEST WITH CONTRAST TECHNIQUE: Multidetector CT imaging of the chest was performed using the standard protocol during bolus administration of intravenous contrast. Multiplanar CT image reconstructions and MIPs were obtained to evaluate the vascular anatomy. CONTRAST:  128mL ISOVUE-370 IOPAMIDOL (ISOVUE-370) INJECTION 76% COMPARISON:  03/14/2018 CT chest. FINDINGS: Cardiovascular: Satisfactory opacification of the pulmonary arteries. Respiratory motion artifact. No central, lobar, or proximal segmental pulmonary embolus. Downstream pulmonary arteries are poorly assessed due to motion artifact. Normal caliber thoracic aorta and main pulmonary artery. Mild calcific atherosclerosis of the aorta and moderate coronary artery calcific atherosclerosis. Normal heart size. No pericardial effusion. Mediastinum/Nodes: Stable extensive mediastinal and hilar adenopathy, for example a right lower paratracheal node measuring 21 mm short axis (series 4, image 39) and a left prevascular node measuring 12 mm (series 4, image 25). Patent central airways. Normal  thoracic esophagus. Lungs/Pleura: Interval diffuse increase in interlobular septal thickening and small bilateral pleural effusions. Pulmonary metastasis are increased in size, for example a nodule in the left upper lobe lingula measuring 20 mm, previously 16 mm (series 6, image 90) and a nodule at the right lung base measuring 25 mm, previously 19 mm (series 6, image 111). Right perihilar consolidations are decreased in comparison with the prior study. Background of severe emphysema. Upper Abdomen: Cholelithiasis. Partially visualized renal cyst. Stable bilateral nonspecific adrenal nodules. Musculoskeletal: Diffuse bony metastatic disease. Extra osseous extension surrounding the left T7 facet, diffusely at T9/10, left anterior T11. Mild T11 anterior compression deformity is stable. No acute osseous abnormality identified. Review of the MIP images confirms the above findings. IMPRESSION: 1. Respiratory motion artifact. No central, lobar, or proximal segmental pulmonary embolus. Downstream pulmonary arteries are poorly assessed due to motion artifact. 2. Interval diffuse increase in interlobular septal thickening and small bilateral pleural effusions compatible with pulmonary edema. 3. Mildly improved right perihilar consolidations which may represent postobstructive pneumonitis or pneumonia. 4. Mild increased size of pulmonary  metastasis. Stable extensive mediastinal and hilar adenopathy. 5. Grossly stable diffuse bony metastatic disease. 6. Stable nonspecific adrenal nodules, likely metastasis. 7. Background of severe emphysema, aortic atherosclerosis, coronary artery calcifications. Electronically Signed   By: Kristine Garbe M.D.   On: 03/26/2018 23:18   Ct Angio Chest Pe W Or Wo Contrast  Result Date: 03/14/2018 CLINICAL DATA:  Increasing shortness of breath. PE suspected, high pretest prob EXAM: CT ANGIOGRAPHY CHEST WITH CONTRAST TECHNIQUE: Multidetector CT imaging of the chest was performed  using the standard protocol during bolus administration of intravenous contrast. Multiplanar CT image reconstructions and MIPs were obtained to evaluate the vascular anatomy. CONTRAST:  159mL ISOVUE-370 IOPAMIDOL (ISOVUE-370) INJECTION 76% COMPARISON:  Multiple prior exams including radiographs earlier this day. Most recent chest CTA 3 weeks ago 02/21/2018 FINDINGS: Cardiovascular: Right chest port with tip in the SVC. There are no filling defects within the pulmonary arteries to suggest pulmonary embolus. Thoracic atherosclerosis without aneurysm or dissection. Coronary artery calcifications versus stents. Heart size is normal. No pericardial effusion. Mediastinum/Nodes: Definite progression of mediastinal adenopathy from prior exam with increased number and size of multiple mediastinal nodes. For example highest mediastinal node image 33 series 4 measures 12 mm short axis, previously 7 mm. Right lower paratracheal node, image 46, measures 2.1 cm, previously 1.8 cm. Right hilar adenopathy has progressed and now causes occlusion of the right middle lobe bronchus. Multiple small left hilar nodes are similar. Esophagus is nondilated. Bilateral gynecomastia. No visualized thyroid nodule. Lungs/Pleura: Advanced emphysema. Honeycombing and fibrosis at the bases again seen. Right hilar adenopathy causes complete right middle lobe collapse. Multiple pulmonary nodules. Definite increase in lingular nodule measuring 16 mm image 105 series 10, previously 9 mm. Previous right middle lobe nodule obscured by surrounding collapse. Right basilar lower lobe nodule measures 19 mm, image 117, unchanged. Multiple additional pulmonary nodules have also increased. Peripheral opacity in the left upper and lower lobes appears similar and is consistent with scarring. Ill-defined right lower lobe perihilar opacities suggest partial collapse rather than pneumonia. No significant pleural fluid. No evidence of pulmonary edema. Upper Abdomen:  Bilateral adrenal nodularity as before. Cholelithiasis. Irregular upper abdominal aortic atherosclerosis. No acute findings. Musculoskeletal: Diffuse osseous metastatic disease with mixed lytic and sclerotic lesions. Extraosseous soft tissue extension of metastasis involving left lamina at T7 invades the spinal canal. Also spinal canal involvement from lesion involving right lamina of T9. No acute fracture. Review of the MIP images confirms the above findings. IMPRESSION: 1. No pulmonary embolus. 2. Progression of metastatic disease in the thorax. Progression in right hilar adenopathy causes occlusion of the right middle lobe bronchus and right middle lobe collapse. Additional mediastinal nodes have also increased in size. Increased size of multiple pulmonary metastasis. 3. Diffuse osseous metastatic disease including extraosseous soft tissue involvement of the spinal canal at T7 and T9. 4. Right lower lobe perihilar consolidation may be postobstructive or infectious. 5. Advanced emphysema with basilar fibrosis. 6. Additional findings as described. Aortic Atherosclerosis (ICD10-I70.0) and Emphysema (ICD10-J43.9). Electronically Signed   By: Keith Rake M.D.   On: 03/14/2018 21:56   Dg Chest Port 1 View  Result Date: 04/04/2018 CLINICAL DATA:  Short of breath EXAM: PORTABLE CHEST 1 VIEW COMPARISON:  04/03/2018 FINDINGS: Underlying interstitial fibrosis. Superimposed bilateral airspace disease unchanged. No effusion. Port-A-Cath tip in the SVC unchanged. Prominent right hilum unchanged consistent with adenopathy. IMPRESSION: No interval change. Underlying interstitial fibrosis with superimposed diffuse bilateral airspace disease which could be infection or edema. Electronically Signed  By: Franchot Gallo M.D.   On: 04/04/2018 07:41   Dg Chest Port 1 View  Result Date: 04/03/2018 CLINICAL DATA:  Shortness of breath EXAM: PORTABLE CHEST 1 VIEW COMPARISON:  03/24/2018 FINDINGS: Cardiac shadow is stable.  Right chest wall port is again seen. Multiple left rib fractures are again noted. Stable chronic emphysematous changes are noted. Some mild vascular congestion and patchy infiltrative opacities are again identified bilaterally relatively stable from the prior exam. Portion of these are related to the known underlying metastatic disease. No acute bony abnormality is noted. IMPRESSION: Stable appearance of the chest when compared with the prior study. Electronically Signed   By: Inez Catalina M.D.   On: 04/03/2018 07:06    Time Spent in minutes 20   Lars Jeziorski M.D on 04/09/2018 at 12:24 PM  Between 7am to 7pm - Pager - [phone]  After 7pm go to [email or web address] - password [facility]  [facility] -  Office  [phone]

## 2018-04-10 DIAGNOSIS — J9621 Acute and chronic respiratory failure with hypoxia: Secondary | ICD-10-CM | POA: Diagnosis present

## 2018-04-10 DIAGNOSIS — J9622 Acute and chronic respiratory failure with hypercapnia: Secondary | ICD-10-CM

## 2018-04-10 MED ORDER — LORAZEPAM 2 MG/ML IJ SOLN: 1.0000 mg | Freq: Four times a day (QID) | INTRAMUSCULAR | Status: DC | PRN

## 2018-04-10 MED ORDER — ONDANSETRON HCL 4 MG/2ML IJ SOLN: 4.0000 mg | Freq: Four times a day (QID) | INTRAMUSCULAR | Status: DC | PRN

## 2018-04-10 MED ORDER — GLYCOPYRROLATE 0.2 MG/ML IJ SOLN
0.2000 mg | INTRAMUSCULAR | Status: DC | PRN
  Administered 2018-04-10: 0.2 mg via INTRAVENOUS
  Filled 2018-04-10 (×3): qty 1

## 2018-04-13 ENCOUNTER — Ambulatory Visit: Payer: Medicare Other | Admitting: Internal Medicine

## 2018-04-14 ENCOUNTER — Ambulatory Visit: Payer: Medicare Other | Admitting: Internal Medicine

## 2018-04-23 NOTE — Progress Notes (Signed)
At 1352 this nurse called into room by family. Patient noted to not be breathing. No heart sounds auscultated.  Patient assessed by Tinnie Gens, RN and myself.  MD notified.  Family at bedside. [place], RN

## 2018-04-23 NOTE — Progress Notes (Signed)
PROGRESS NOTE                                                                                                                                                                                                             Patient Demographics:    Alexander Ellis, is a 72 y.o. male, DOB - 08-16-1945, EXB:[id number]  Admit date - 04/08/2018   Admitting Physician Elwyn Reach, MD  Outpatient Primary MD for the patient is [name], NP  LOS - 10  Outpatient Specialists: Oncology (Dr. Marin Olp)  Chief Complaint  Patient presents with  . 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  Patient managed in stepdown unit with high flow nasal cannula (initially required BiPAP).   Subjective:   Patient not responding to verbal or physical stimuli.  Having shallow and coarse breathing.  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 Required BiPAP for several days, IV steroid and antibiotic.  Now continuously requiring high flow nasal cannula.   Appreciate PCCM and palliative care consult.  Discontinued steroid due to persistent agitation.  Nebs as needed.   Patient now on comfort measures on IV morphine drip.  Dose increased to 3 mg/h today as patient having shallow breathing, nonverbal and not responding to stimuli.  Switch to high flow nasal cannula to 2 L.  Anticipate in the hospital death within few hours to maximum 24 hours..  Ordered PRN Robinul for secretions.   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 Received 2 cycles of chemotherapy with Taxotere.  Now discontinued.  Will not tolerate chemotherapy given poor prognosis. Now on morphine drip.  Acute on chronic diastolic CHF with pulmonary edema      Neovascular glaucoma Discontinue eyedrops  GERD Discontinue Pepcid  Severe protein calorie malnutrition  Code Status : DNR, comfort measures,   Family Communication  : Wife, son and daughter at bedside  Disposition Plan  : Anticipate hospital death.  Barriers For Discharge : Active symptoms  Consults  : Oncology, PCCM, palliative care  Procedures  : CT angiogram of the chest, 2D echo  DVT Prophylaxis  : Comfort measures  Lab Results  Component Value Date   PLT 248 04/07/2018    Antibiotics  :   Anti-infectives (From admission, onward)   Start     Dose/Rate Route Frequency Ordered Stop   04/01/18 2200  vancomycin (VANCOCIN) IVPB 750 mg/150 ml premix     750 mg 150 mL/hr over 60 Minutes Intravenous Every 24 hours 04/19/2018 2116 04/04/18 2303   04/01/18 0000  aztreonam (AZACTAM) 2 g in sodium chloride 0.9 % 100 mL IVPB     2 g 200 mL/hr over 30 Minutes Intravenous Every 8 hours 03/30/2018 2011 04/04/18 1552   04/04/2018 1745  vancomycin (VANCOCIN) IVPB 1000 mg/200 mL premix     1,000 mg 200 mL/hr over 60 Minutes Intravenous  Once 04/08/2018 1734 04/16/2018 2221   04/02/2018 1730  aztreonam (AZACTAM) 2 g in sodium chloride 0.9 % 100 mL IVPB     2 g 200 mL/hr over 30 Minutes Intravenous  Once 04/05/2018 1725 04/18/2018 1823        Objective:   Vitals:   04/09/18 1925 04/09/18 2032 2018/05/06 0541 05-06-18 0927  BP:  103/68 120/63   Pulse:  (!) 101 (!) 101 (!) 108  Resp:  (!) 6 (!) 6 16  Temp:  97.9 F (36.6 C) 98.3 F (36.8 C)   TempSrc:  Oral Oral   SpO2: (!) 82% (!) 89% (!) 82% (S) (!) 81%  Weight:      Height:         Wt Readings from Last 3 Encounters:  04/07/18 53 kg  03/28/18 54.4 kg  03/17/18 54.1 kg     Intake/Output Summary (Last 24 hours) at May 06, 2018 1340 Last data filed at 2018-05-06 0500 Gross per 24 hour  Intake 120 ml  Output 250 ml  Net -130 ml   Physical exam Shallow breathing, nonverbal, not oriented Chest: Diminished bilateral breath sound CVS: S1 and S2 regular GI: Soft, nondistended, nontender        Data Review:    CBC Recent Labs  Lab 04/04/18 1519 04/05/18 0404 04/06/18 0655 04/07/18 0614 04/07/18 0737  WBC 11.8* 12.7* 13.9* QUESTIONABLE RESULTS, RECOMMEND RECOLLECT TO VERIFY 13.9*  HGB 8.7* 9.0* 9.3* QUESTIONABLE RESULTS, RECOMMEND RECOLLECT TO VERIFY 9.7*  HCT 28.4* 28.7* 30.1* QUESTIONABLE RESULTS, RECOMMEND RECOLLECT TO VERIFY 31.2*  PLT 234 249 256 QUESTIONABLE RESULTS, RECOMMEND RECOLLECT TO VERIFY 248  MCV 109.7* 108.7* 109.1* QUESTIONABLE RESULTS, RECOMMEND RECOLLECT TO VERIFY 109.9*  MCH 33.6 34.1* 33.7 QUESTIONABLE RESULTS, RECOMMEND RECOLLECT TO VERIFY 34.2*  MCHC 30.6 31.4 30.9 QUESTIONABLE RESULTS, RECOMMEND RECOLLECT TO VERIFY 31.1  RDW 15.4 15.6* 15.4 QUESTIONABLE RESULTS, RECOMMEND RECOLLECT TO VERIFY 15.7*  LYMPHSABS 0.4* 0.6* 0.7 QUESTIONABLE RESULTS, RECOMMEND RECOLLECT TO VERIFY 0.8  MONOABS 0.3 0.6 0.6 QUESTIONABLE RESULTS, RECOMMEND RECOLLECT TO VERIFY 0.6  EOSABS 0.0 0.0 0.0 QUESTIONABLE RESULTS, RECOMMEND RECOLLECT TO VERIFY 0.0  BASOSABS 0.0 0.0 0.0 QUESTIONABLE RESULTS, RECOMMEND RECOLLECT TO VERIFY 0.0    Chemistries  Recent Labs  Lab 04/04/18 0347 04/05/18 0404 04/06/18 0543 04/06/18 0655 04/07/18 0614 04/07/18 0737  NA 138 138 QUESTIONABLE RESULTS, RECOMMEND RECOLLECT TO VERIFY 135 QUESTIONABLE RESULTS, RECOMMEND RECOLLECT TO VERIFY  139  K 5.0 4.6 QUESTIONABLE RESULTS, RECOMMEND RECOLLECT TO VERIFY 4.6 QUESTIONABLE RESULTS, RECOMMEND RECOLLECT TO VERIFY 4.4  CL 103 101 QUESTIONABLE RESULTS, RECOMMEND RECOLLECT TO  VERIFY 97* QUESTIONABLE RESULTS, RECOMMEND RECOLLECT TO VERIFY 101  CO2 26 28 QUESTIONABLE RESULTS, RECOMMEND RECOLLECT TO VERIFY 28 QUESTIONABLE RESULTS, RECOMMEND RECOLLECT TO VERIFY 27  GLUCOSE 125* 126* QUESTIONABLE RESULTS, RECOMMEND RECOLLECT TO VERIFY 119* QUESTIONABLE RESULTS, RECOMMEND RECOLLECT TO VERIFY 126*  BUN 40* 44* QUESTIONABLE RESULTS, RECOMMEND RECOLLECT TO VERIFY 40* QUESTIONABLE RESULTS, RECOMMEND RECOLLECT TO VERIFY 38*  CREATININE 1.01 1.02 QUESTIONABLE RESULTS, RECOMMEND RECOLLECT TO VERIFY 0.94 QUESTIONABLE RESULTS, RECOMMEND RECOLLECT TO VERIFY 0.93  CALCIUM 8.6* 8.8* QUESTIONABLE RESULTS, RECOMMEND RECOLLECT TO VERIFY 8.8* QUESTIONABLE RESULTS, RECOMMEND RECOLLECT TO VERIFY 8.7*  MG 2.4  --   --   --   --   --   AST 18 19 QUESTIONABLE RESULTS, RECOMMEND RECOLLECT TO VERIFY 20 QUESTIONABLE RESULTS, RECOMMEND RECOLLECT TO VERIFY 23  ALT 17 16 QUESTIONABLE RESULTS, RECOMMEND RECOLLECT TO VERIFY 14 QUESTIONABLE RESULTS, RECOMMEND RECOLLECT TO VERIFY 16  ALKPHOS 37* 38 QUESTIONABLE RESULTS, RECOMMEND RECOLLECT TO VERIFY 41 QUESTIONABLE RESULTS, RECOMMEND RECOLLECT TO VERIFY 52  BILITOT 0.5 0.5 QUESTIONABLE RESULTS, RECOMMEND RECOLLECT TO VERIFY 0.8 QUESTIONABLE RESULTS, RECOMMEND RECOLLECT TO VERIFY 0.8   ------------------------------------------------------------------------------------------------------------------ No results for input(s): CHOL, HDL, LDLCALC, TRIG, CHOLHDL, LDLDIRECT in the last 72 hours.  No results found for: HGBA1C ------------------------------------------------------------------------------------------------------------------ No results for input(s): TSH, T4TOTAL, T3FREE, THYROIDAB in the last 72 hours.  Invalid input(s): FREET3 ------------------------------------------------------------------------------------------------------------------ No results for input(s): VITAMINB12, FOLATE, FERRITIN, TIBC, IRON, RETICCTPCT in the last 72  hours.  Coagulation profile No results for input(s): INR, PROTIME in the last 168 hours.  No results for input(s): DDIMER in the last 72 hours.  Cardiac Enzymes No results for input(s): CKMB, TROPONINI, MYOGLOBIN in the last 168 hours.  Invalid input(s): CK ------------------------------------------------------------------------------------------------------------------    Component Value Date/Time   BNP 93.0 04/11/2018 1611    Inpatient Medications  Scheduled Meds: . sodium chloride   Intravenous Once  . chlorhexidine  15 mL Mouth Rinse BID  . fentaNYL  25 mcg Transdermal Q72H  . mouth rinse  15 mL Mouth Rinse q12n4p   Continuous Infusions: . morphine 3 mg/hr (2018/05/01 0935)   PRN Meds:.glycopyrrolate, ipratropium-albuterol, lip balm, LORazepam, MUSCLE RUB, ondansetron (ZOFRAN) IV, sodium chloride flush  Micro Results Recent Results (from the past 240 hour(s))  Culture, blood (routine x 2) Call MD if unable to obtain prior to antibiotics being given     Status: None   Collection Time: 04/04/2018  8:28 PM  Result Value Ref Range Status   Specimen Description   Final    BLOOD LEFT Performed at [facility] [address] [place], [place] [address]    Special Requests   Final    BOTTLES DRAWN AEROBIC ONLY Blood Culture adequate volume Performed at [facility] [address], [place], [place] [address]    Culture   Final    NO GROWTH 5 DAYS Performed at [facility], [place] [address], [place], [place] [address]    Report Status 04/05/2018 FINAL  Final  Culture, blood (routine x 2) Call MD if unable to obtain prior to antibiotics being given     Status: None   Collection Time: 04/15/2018  8:29 PM  Result Value Ref Range Status   Specimen Description   Final    BLOOD LEFT Performed at [facility]  [address] [place], [place] [address]    Special Requests   Final    BOTTLES DRAWN AEROBIC ONLY Blood  Culture adequate volume Performed at [facility] [address], [place], [place] [address]    Culture   Final    NO GROWTH 5 DAYS Performed at [facility], [place] [address] [place], [place] [address]    Report Status 04/05/2018 FINAL  Final  Expectorated sputum assessment w rflx to resp cult     Status: None   Collection Time: 04/01/18  5:57 PM  Result Value Ref Range Status   Specimen Description SPUTUM  Final   Special Requests NONE  Final   Sputum evaluation   Final    Sputum specimen not acceptable for testing.  Please recollect.   NOTIFIED ARNOLD,A RN @1909  ON 04/01/18 JACKSON,K Performed at [facility], [place] [address], [place], [place] [address]    Report Status 04/01/2018 FINAL  Final  MRSA PCR Screening     Status: None   Collection Time: 04/01/18  6:40 PM  Result Value Ref Range Status   MRSA by PCR NEGATIVE NEGATIVE Final    Comment:        The GeneXpert MRSA Assay (FDA approved for NASAL specimens only), is one component of a comprehensive MRSA colonization surveillance program. It is not intended to diagnose MRSA infection nor to guide or monitor treatment for MRSA infections. Performed at [facility], [place] [address], [place], [place] [address]   Expectorated sputum assessment w rflx to resp cult     Status: None   Collection Time: 04/04/18  8:56 AM  Result Value Ref Range Status   Specimen Description SPUTUM  Final   Special Requests Normal  Final   Sputum evaluation   Final    THIS SPECIMEN IS ACCEPTABLE FOR SPUTUM CULTURE Performed at [facility], [place] [address], [place], [place] [address]    Report Status 04/04/2018 FINAL  Final  Culture, respiratory     Status: None   Collection Time: 04/04/18  8:56 AM  Result Value Ref Range Status   Specimen Description   Final    SPUTUM Performed at [facility] [address], [place], [place] [address]     Special Requests   Final    Normal Reflexed from [phone] Performed at [facility], [place] [address] [place], [place] [address]    Gram Stain   Final    RARE WBC PRESENT, PREDOMINANTLY PMN RARE GRAM POSITIVE COCCI    Culture   Final    FEW Consistent with normal respiratory flora. Performed at [facility], [place] [address], [place], [place] [address]    Report Status 04/06/2018 FINAL  Final    Radiology Reports Dg Chest 2 View  Result Date: 03/30/2018 CLINICAL DATA:  Shortness of breath EXAM: CHEST - 2 VIEW COMPARISON:  03/19/2018 FINDINGS: Porta catheter on the right with tip at the SVC. There is extensive bilateral interstitial and airspace opacity with progressed density in the right mid lung since prior. No effusion or pneumothorax. Normal heart size and mediastinal contours.  Remote rib fractures. IMPRESSION: 1. Increased opacity in the right mid lung, presumably pneumonia. 2. Background of severe emphysema. Electronically Signed   By: Monte Fantasia M.D.   On: 04/15/2018 15:52   X-ray Chest Pa And Lateral  Result Date: 03/14/2018 CLINICAL DATA:  Respiratory failure EXAM: CHEST - 2 VIEW COMPARISON:  CT  02/21/2018, radiograph 02/21/2018, 01/27/2018 FINDINGS: Right-sided central venous port tip over the SVC. Emphysematous disease and bilateral fibrosis. Progressed consolidation at the right middle lobe and right base. No pleural effusion. Bilateral lung nodules corresponding to pulmonary metastatic disease. Old left-sided rib fractures. Stable cardiomediastinal silhouette. Multifocal sclerosis at the ribs and spine. IMPRESSION: 1. Progressed airspace disease at the right middle lobe and right base, may reflect atelectasis or pneumonia superimposed on chronic interstitial disease 2. Diffuse fibrosis and emphysematous disease. Bilateral lung nodules corresponding to history of metastatic pulmonary nodules. 3. Multifocal sclerosis consistent with osseous metastatic  disease Electronically Signed   By: Donavan Foil M.D.   On: 03/14/2018 16:12   Ct Angio Chest Pe W/cm &/or Wo Cm  Result Date: 03/27/2018 CLINICAL DATA:  72 y/o M; shortness of breath and dyspnea. Diagnose 2 weeks ago for pneumonia. Metastatic cancer. EXAM: CT ANGIOGRAPHY CHEST WITH CONTRAST TECHNIQUE: Multidetector CT imaging of the chest was performed using the standard protocol during bolus administration of intravenous contrast. Multiplanar CT image reconstructions and MIPs were obtained to evaluate the vascular anatomy. CONTRAST:  159mL ISOVUE-370 IOPAMIDOL (ISOVUE-370) INJECTION 76% COMPARISON:  03/14/2018 CT chest. FINDINGS: Cardiovascular: Satisfactory opacification of the pulmonary arteries. Respiratory motion artifact. No central, lobar, or proximal segmental pulmonary embolus. Downstream pulmonary arteries are poorly assessed due to motion artifact. Normal caliber thoracic aorta and main pulmonary artery. Mild calcific atherosclerosis of the aorta and moderate coronary artery calcific atherosclerosis. Normal heart size. No pericardial effusion. Mediastinum/Nodes: Stable extensive mediastinal and hilar adenopathy, for example a right lower paratracheal node measuring 21 mm short axis (series 4, image 39) and a left prevascular node measuring 12 mm (series 4, image 25). Patent central airways. Normal thoracic esophagus. Lungs/Pleura: Interval diffuse increase in interlobular septal thickening and small bilateral pleural effusions. Pulmonary metastasis are increased in size, for example a nodule in the left upper lobe lingula measuring 20 mm, previously 16 mm (series 6, image 90) and a nodule at the right lung base measuring 25 mm, previously 19 mm (series 6, image 111). Right perihilar consolidations are decreased in comparison with the prior study. Background of severe emphysema. Upper Abdomen: Cholelithiasis. Partially visualized renal cyst. Stable bilateral nonspecific adrenal nodules.  Musculoskeletal: Diffuse bony metastatic disease. Extra osseous extension surrounding the left T7 facet, diffusely at T9/10, left anterior T11. Mild T11 anterior compression deformity is stable. No acute osseous abnormality identified. Review of the MIP images confirms the above findings. IMPRESSION: 1. Respiratory motion artifact. No central, lobar, or proximal segmental pulmonary embolus. Downstream pulmonary arteries are poorly assessed due to motion artifact. 2. Interval diffuse increase in interlobular septal thickening and small bilateral pleural effusions compatible with pulmonary edema. 3. Mildly improved right perihilar consolidations which may represent postobstructive pneumonitis or pneumonia. 4. Mild increased size of pulmonary metastasis. Stable extensive mediastinal and hilar adenopathy. 5. Grossly stable diffuse bony metastatic disease. 6. Stable nonspecific adrenal nodules, likely metastasis. 7. Background of severe emphysema, aortic atherosclerosis, coronary artery calcifications. Electronically Signed   By: Kristine Garbe M.D.   On: 03/30/2018 23:18   Ct Angio Chest Pe W Or Wo Contrast  Result Date: 03/14/2018 CLINICAL DATA:  Increasing shortness of breath. PE suspected, high pretest prob EXAM: CT ANGIOGRAPHY CHEST WITH CONTRAST TECHNIQUE: Multidetector CT imaging of the chest was performed using the standard protocol during bolus administration of intravenous contrast. Multiplanar CT image reconstructions and MIPs were obtained to evaluate the vascular anatomy. CONTRAST:  136mL ISOVUE-370 IOPAMIDOL (ISOVUE-370) INJECTION 76% COMPARISON:  Multiple prior  exams including radiographs earlier this day. Most recent chest CTA 3 weeks ago 02/21/2018 FINDINGS: Cardiovascular: Right chest port with tip in the SVC. There are no filling defects within the pulmonary arteries to suggest pulmonary embolus. Thoracic atherosclerosis without aneurysm or dissection. Coronary artery calcifications  versus stents. Heart size is normal. No pericardial effusion. Mediastinum/Nodes: Definite progression of mediastinal adenopathy from prior exam with increased number and size of multiple mediastinal nodes. For example highest mediastinal node image 33 series 4 measures 12 mm short axis, previously 7 mm. Right lower paratracheal node, image 46, measures 2.1 cm, previously 1.8 cm. Right hilar adenopathy has progressed and now causes occlusion of the right middle lobe bronchus. Multiple small left hilar nodes are similar. Esophagus is nondilated. Bilateral gynecomastia. No visualized thyroid nodule. Lungs/Pleura: Advanced emphysema. Honeycombing and fibrosis at the bases again seen. Right hilar adenopathy causes complete right middle lobe collapse. Multiple pulmonary nodules. Definite increase in lingular nodule measuring 16 mm image 105 series 10, previously 9 mm. Previous right middle lobe nodule obscured by surrounding collapse. Right basilar lower lobe nodule measures 19 mm, image 117, unchanged. Multiple additional pulmonary nodules have also increased. Peripheral opacity in the left upper and lower lobes appears similar and is consistent with scarring. Ill-defined right lower lobe perihilar opacities suggest partial collapse rather than pneumonia. No significant pleural fluid. No evidence of pulmonary edema. Upper Abdomen: Bilateral adrenal nodularity as before. Cholelithiasis. Irregular upper abdominal aortic atherosclerosis. No acute findings. Musculoskeletal: Diffuse osseous metastatic disease with mixed lytic and sclerotic lesions. Extraosseous soft tissue extension of metastasis involving left lamina at T7 invades the spinal canal. Also spinal canal involvement from lesion involving right lamina of T9. No acute fracture. Review of the MIP images confirms the above findings. IMPRESSION: 1. No pulmonary embolus. 2. Progression of metastatic disease in the thorax. Progression in right hilar adenopathy causes  occlusion of the right middle lobe bronchus and right middle lobe collapse. Additional mediastinal nodes have also increased in size. Increased size of multiple pulmonary metastasis. 3. Diffuse osseous metastatic disease including extraosseous soft tissue involvement of the spinal canal at T7 and T9. 4. Right lower lobe perihilar consolidation may be postobstructive or infectious. 5. Advanced emphysema with basilar fibrosis. 6. Additional findings as described. Aortic Atherosclerosis (ICD10-I70.0) and Emphysema (ICD10-J43.9). Electronically Signed   By: Keith Rake M.D.   On: 03/14/2018 21:56   Dg Chest Port 1 View  Result Date: 04/04/2018 CLINICAL DATA:  Short of breath EXAM: PORTABLE CHEST 1 VIEW COMPARISON:  04/03/2018 FINDINGS: Underlying interstitial fibrosis. Superimposed bilateral airspace disease unchanged. No effusion. Port-A-Cath tip in the SVC unchanged. Prominent right hilum unchanged consistent with adenopathy. IMPRESSION: No interval change. Underlying interstitial fibrosis with superimposed diffuse bilateral airspace disease which could be infection or edema. Electronically Signed   By: Franchot Gallo M.D.   On: 04/04/2018 07:41   Dg Chest Port 1 View  Result Date: 04/03/2018 CLINICAL DATA:  Shortness of breath EXAM: PORTABLE CHEST 1 VIEW COMPARISON:  04/08/2018 FINDINGS: Cardiac shadow is stable. Right chest wall port is again seen. Multiple left rib fractures are again noted. Stable chronic emphysematous changes are noted. Some mild vascular congestion and patchy infiltrative opacities are again identified bilaterally relatively stable from the prior exam. Portion of these are related to the known underlying metastatic disease. No acute bony abnormality is noted. IMPRESSION: Stable appearance of the chest when compared with the prior study. Electronically Signed   By: Inez Catalina M.D.   On: 04/03/2018 07:06  Time Spent in minutes 20   Endrit Gittins M.D on 04-14-18 at 1:40  PM  Between 7am to 7pm - Pager - [phone]  After 7pm go to [email or web address] - password [facility]  [facility] -  Office  [phone]

## 2018-04-23 NOTE — Progress Notes (Signed)
[facility]  Pt is an active pt with [facility] a home based Palliative Care program that is provided by [facility]. The nurses at [facility] offered hospice care and arranged for a hospice nurse to visit and give them information on Friday last week however the pt refused the services at that time. Offered support to the pt's wife and children today after seeing that he is unresponsive and getting comfort care. Thank you for helping care and keeping the pt comfortable. [facility] Liaison [phone]

## 2018-04-23 NOTE — Progress Notes (Signed)
56ml of remaining Morphine drip wasted in medication disposal container.  Witnessed by Tinnie Gens, RN.  Virginia Rochester, RN

## 2018-04-23 NOTE — Care Management Important Message (Signed)
Important Message  Patient Details  Name: DANZELL BIRKY MRN: [id number] Date of Birth: 31-Aug-1945   Medicare Important Message Given:  Yes    Kerin Salen 2018-04-28, 12:01 [place] Message  Patient Details  Name: ESTEPHAN GALLARDO MRN: [id number] Date of Birth: 09-25-1945   Medicare Important Message Given:  Yes    Kerin Salen April 28, 2018, 12:01 PM

## 2018-04-23 NOTE — Progress Notes (Signed)
Pt. Is palliative care

## 2018-04-23 NOTE — Progress Notes (Signed)
PMT progress note  Patient appears to have transitioned into the active stages of dying. He is not awake, he is not alert. His eyes are partially open and he has a glazed, faraway look. He does not move his extremities spontaneously. He has a lot of noisy breathing. He does not appear to have nonverbal gestures of distress/discomfort: No wincing, no grimacing, no for owing of the brow, no clenching of the jaw noted. Some paradoxical respirations are evident. Some apneic pauses are also noted. Does not verbalize. Family holding vigil at bedside. Wife, son and daughter and several other family members also present. I have discussed end-of-life signs and symptoms and estimated prognosis with them. Prognosis appears to be hours to days.  Discussed about morphine drip. Agree with hospital attending physician recommendations to increase morphine drip to 3 mg an hour. Discussed extensively about role of supplemental oxygen at end-of-life. At home, patient was requiring at least 6 L of oxygen via nasal cannula. Currently, right now, he has been taken off of high flow oxygen, currently on 3-4 liters of oxygen via nasal cannula. Discussed with family about continuing this as a comfort measure.  Offered active listening and supportive care. Wife and daughter are tearful.  Discussed with bedside RN as well as pharmacist. Agree with discontinuing unnecessary medications not directly responsible for comfort. Agree with discontinuing all by mouth medications as patient is no longer alert or responsive.      BP 120/63 (BP Location: Right Arm)   Pulse (!) 108   Temp 98.3 F (36.8 C) (Oral)   Resp 16   Ht 5\' 9"  (1.753 m)   Wt 53 kg   SpO2 (S) (!) 81% Comment: Patient comfort care  BMI 17.25 kg/m   Labs and imaging noted.   Appears frail and weak. Coarse noisy breathing S 1 S 2 unresponsive Abdomen not distended.  Extremities with no edema, clubbing of the digits, extremities warm to touch no coolness no  mottling noted  Acute on chronic hypoxic resp failure.  COPD exacerbation.  Metastatic prostate cancer.  Bone mets.  Possible lymphangitic spread.   Discussed with patient, children ,cousin and wife at bedside.     PPS 10%  titrate morphine drip for comfort care  Ativan as needed for anxiety agitation.    Anticipated hospital death.    prognosis: few hours to some very limited number of days, in my opinion.    This has been discussed frankly and compassionately with patient and family at bedside.   35 minutes spent.  Loistine Chance MD [facility] health palliative medicine team [phone]

## 2018-04-23 NOTE — Discharge Summary (Signed)
Physician Discharge Summary  Alexander Ellis MWN:[id number] DOB: Feb 12, 1946 DOA: 04/14/2018  PCP: [name], NP  Admit date: 04/04/2018 Discharge date: Apr 29, 2018  Admitted From: Home Disposition: Patient expired on 04/29/18 at 1: 72 3 PM    Discharge Diagnoses:    Acute on chronic respiratory failure with hypoxia and hypercapnia (HCC)  Active Problems:   AAA (abdominal aortic aneurysm) (HCC)   Chronic hepatitis B (HCC)   Prostate cancer metastatic to bone (HCC)   DOE (dyspnea on exertion)   Protein-calorie malnutrition, severe   Right middle lobe pulmonary infiltrate   SOB (shortness of breath)   Brief narrative/HPI 72 year old male with metastatic proximal cancer to the bone, lung cancer, COPD with chronic respiratory failure on 6-8 L via nasal cannula, chronic prednisone, abdominal aortic aneurysm, history of hep B and GERD admitted for COPD exacerbation with worsened symptoms.  Patient managed in stepdown unit with high flow nasal cannula (initially required BiPAP).  Principal problem Acute on chronic respiratory failure with hypoxia (HCC) Secondary to emphysematous COPD and metastatic prostate cancer with mets to the lung, HCAP and possible fluid overload with diastolic CHF. Required BiPAP for several days, IV steroid and antibiotic.    Required high flow nasal cannula persistently for several days.  After extensive discussion with patient and family patient made comfort measures and placed on IV morphine drip.  He was still requiring high flow nasal cannula.  Patient was being planned to be transferred to residential hospice (transfer delayed as patient and family insisted being on current high flow nasal cannula of 30-40% and residential hospice would not be able to provide that high amount of oxygen)  Night of 11/17 patient was not responding to commands (both verbal and physical stimuli) was having shallow respirations with coarse breath sounds and increased secretions.   Morphine drip was increased and given PRN glycopyrrolate. Patient expired on 11/18 at 1:53 PM.  Family at bedside.   Active problems Healthcare associated pneumonia ([place]) Completed 5-day course of antibiotics  Metastatic prostate cancer to the bone and the lungs. Received 2 cycles of chemotherapy with Taxotere.   Acute on chronic diastolic CHF with pulmonary edema       GERD   Severe protein calorie malnutrition          Family Communication  : Wife, son and daughter at bedside  Consults  : Oncology, PCCM, palliative care  Procedures  : CT angiogram of the chest, 2D echo  Discharge Instructions   Allergies as of 04-29-2018      Reactions   Oxycodone Other (See Comments)   somnolence Sleep all the time   Penicillins Anaphylaxis, Hives, Rash   Has patient had a PCN reaction causing immediate rash, facial/tongue/throat swelling, SOB or lightheadedness with hypotension: Yes Has patient had a PCN reaction causing severe rash involving mucus membranes or skin necrosis: No Has patient had a PCN reaction that required hospitalization: No Has patient had a PCN reaction occurring within the last 10 years: Yes If all of the above answers are "NO", then may proceed with Cephalosporin use.   Codeine Nausea And Vomiting   Other Nausea Only, Other (See Comments)   Uncoded Allergy. Allergen: IV contrast   Hydrocodone-acetaminophen Nausea And Vomiting   Iohexol Nausea And Vomiting          Allergies  Allergen Reactions  . Oxycodone Other (See Comments)    somnolence Sleep all the time  . Penicillins Anaphylaxis, Hives and Rash    Has patient had  a PCN reaction causing immediate rash, facial/tongue/throat swelling, SOB or lightheadedness with hypotension: Yes Has patient had a PCN reaction causing severe rash involving mucus membranes or skin necrosis: No Has patient had a PCN reaction that required hospitalization: No Has patient had a PCN  reaction occurring within the last 10 years: Yes If all of the above answers are "NO", then may proceed with Cephalosporin use.   . Codeine Nausea And Vomiting  . Other Nausea Only and Other (See Comments)    Uncoded Allergy. Allergen: IV contrast  . Hydrocodone-Acetaminophen Nausea And Vomiting  . Iohexol Nausea And Vomiting       Procedures/Studies: Dg Chest 2 View  Result Date: 04/03/2018 CLINICAL DATA:  Shortness of breath EXAM: CHEST - 2 VIEW COMPARISON:  03/19/2018 FINDINGS: Porta catheter on the right with tip at the SVC. There is extensive bilateral interstitial and airspace opacity with progressed density in the right mid lung since prior. No effusion or pneumothorax. Normal heart size and mediastinal contours.  Remote rib fractures. IMPRESSION: 1. Increased opacity in the right mid lung, presumably pneumonia. 2. Background of severe emphysema. Electronically Signed   By: Monte Fantasia M.D.   On: 04/18/2018 15:52   X-ray Chest Pa And Lateral  Result Date: 03/14/2018 CLINICAL DATA:  Respiratory failure EXAM: CHEST - 2 VIEW COMPARISON:  CT 02/21/2018, radiograph 02/21/2018, 01/27/2018 FINDINGS: Right-sided central venous port tip over the SVC. Emphysematous disease and bilateral fibrosis. Progressed consolidation at the right middle lobe and right base. No pleural effusion. Bilateral lung nodules corresponding to pulmonary metastatic disease. Old left-sided rib fractures. Stable cardiomediastinal silhouette. Multifocal sclerosis at the ribs and spine. IMPRESSION: 1. Progressed airspace disease at the right middle lobe and right base, may reflect atelectasis or pneumonia superimposed on chronic interstitial disease 2. Diffuse fibrosis and emphysematous disease. Bilateral lung nodules corresponding to history of metastatic pulmonary nodules. 3. Multifocal sclerosis consistent with osseous metastatic disease Electronically Signed   By: Donavan Foil M.D.   On: 03/14/2018 16:12   Ct Angio  Chest Pe W/cm &/or Wo Cm  Result Date: 04/07/2018 CLINICAL DATA:  72 y/o M; shortness of breath and dyspnea. Diagnose 2 weeks ago for pneumonia. Metastatic cancer. EXAM: CT ANGIOGRAPHY CHEST WITH CONTRAST TECHNIQUE: Multidetector CT imaging of the chest was performed using the standard protocol during bolus administration of intravenous contrast. Multiplanar CT image reconstructions and MIPs were obtained to evaluate the vascular anatomy. CONTRAST:  167mL ISOVUE-370 IOPAMIDOL (ISOVUE-370) INJECTION 76% COMPARISON:  03/14/2018 CT chest. FINDINGS: Cardiovascular: Satisfactory opacification of the pulmonary arteries. Respiratory motion artifact. No central, lobar, or proximal segmental pulmonary embolus. Downstream pulmonary arteries are poorly assessed due to motion artifact. Normal caliber thoracic aorta and main pulmonary artery. Mild calcific atherosclerosis of the aorta and moderate coronary artery calcific atherosclerosis. Normal heart size. No pericardial effusion. Mediastinum/Nodes: Stable extensive mediastinal and hilar adenopathy, for example a right lower paratracheal node measuring 21 mm short axis (series 4, image 39) and a left prevascular node measuring 12 mm (series 4, image 25). Patent central airways. Normal thoracic esophagus. Lungs/Pleura: Interval diffuse increase in interlobular septal thickening and small bilateral pleural effusions. Pulmonary metastasis are increased in size, for example a nodule in the left upper lobe lingula measuring 20 mm, previously 16 mm (series 6, image 90) and a nodule at the right lung base measuring 25 mm, previously 19 mm (series 6, image 111). Right perihilar consolidations are decreased in comparison with the prior study. Background of severe emphysema. Upper Abdomen: Cholelithiasis.  Partially visualized renal cyst. Stable bilateral nonspecific adrenal nodules. Musculoskeletal: Diffuse bony metastatic disease. Extra osseous extension surrounding the left T7 facet,  diffusely at T9/10, left anterior T11. Mild T11 anterior compression deformity is stable. No acute osseous abnormality identified. Review of the MIP images confirms the above findings. IMPRESSION: 1. Respiratory motion artifact. No central, lobar, or proximal segmental pulmonary embolus. Downstream pulmonary arteries are poorly assessed due to motion artifact. 2. Interval diffuse increase in interlobular septal thickening and small bilateral pleural effusions compatible with pulmonary edema. 3. Mildly improved right perihilar consolidations which may represent postobstructive pneumonitis or pneumonia. 4. Mild increased size of pulmonary metastasis. Stable extensive mediastinal and hilar adenopathy. 5. Grossly stable diffuse bony metastatic disease. 6. Stable nonspecific adrenal nodules, likely metastasis. 7. Background of severe emphysema, aortic atherosclerosis, coronary artery calcifications. Electronically Signed   By: Kristine Garbe M.D.   On: 04/17/2018 23:18   Ct Angio Chest Pe W Or Wo Contrast  Result Date: 03/14/2018 CLINICAL DATA:  Increasing shortness of breath. PE suspected, high pretest prob EXAM: CT ANGIOGRAPHY CHEST WITH CONTRAST TECHNIQUE: Multidetector CT imaging of the chest was performed using the standard protocol during bolus administration of intravenous contrast. Multiplanar CT image reconstructions and MIPs were obtained to evaluate the vascular anatomy. CONTRAST:  110mL ISOVUE-370 IOPAMIDOL (ISOVUE-370) INJECTION 76% COMPARISON:  Multiple prior exams including radiographs earlier this day. Most recent chest CTA 3 weeks ago 02/21/2018 FINDINGS: Cardiovascular: Right chest port with tip in the SVC. There are no filling defects within the pulmonary arteries to suggest pulmonary embolus. Thoracic atherosclerosis without aneurysm or dissection. Coronary artery calcifications versus stents. Heart size is normal. No pericardial effusion. Mediastinum/Nodes: Definite progression of  mediastinal adenopathy from prior exam with increased number and size of multiple mediastinal nodes. For example highest mediastinal node image 33 series 4 measures 12 mm short axis, previously 7 mm. Right lower paratracheal node, image 46, measures 2.1 cm, previously 1.8 cm. Right hilar adenopathy has progressed and now causes occlusion of the right middle lobe bronchus. Multiple small left hilar nodes are similar. Esophagus is nondilated. Bilateral gynecomastia. No visualized thyroid nodule. Lungs/Pleura: Advanced emphysema. Honeycombing and fibrosis at the bases again seen. Right hilar adenopathy causes complete right middle lobe collapse. Multiple pulmonary nodules. Definite increase in lingular nodule measuring 16 mm image 105 series 10, previously 9 mm. Previous right middle lobe nodule obscured by surrounding collapse. Right basilar lower lobe nodule measures 19 mm, image 117, unchanged. Multiple additional pulmonary nodules have also increased. Peripheral opacity in the left upper and lower lobes appears similar and is consistent with scarring. Ill-defined right lower lobe perihilar opacities suggest partial collapse rather than pneumonia. No significant pleural fluid. No evidence of pulmonary edema. Upper Abdomen: Bilateral adrenal nodularity as before. Cholelithiasis. Irregular upper abdominal aortic atherosclerosis. No acute findings. Musculoskeletal: Diffuse osseous metastatic disease with mixed lytic and sclerotic lesions. Extraosseous soft tissue extension of metastasis involving left lamina at T7 invades the spinal canal. Also spinal canal involvement from lesion involving right lamina of T9. No acute fracture. Review of the MIP images confirms the above findings. IMPRESSION: 1. No pulmonary embolus. 2. Progression of metastatic disease in the thorax. Progression in right hilar adenopathy causes occlusion of the right middle lobe bronchus and right middle lobe collapse. Additional mediastinal nodes  have also increased in size. Increased size of multiple pulmonary metastasis. 3. Diffuse osseous metastatic disease including extraosseous soft tissue involvement of the spinal canal at T7 and T9. 4. Right lower lobe perihilar  consolidation may be postobstructive or infectious. 5. Advanced emphysema with basilar fibrosis. 6. Additional findings as described. Aortic Atherosclerosis (ICD10-I70.0) and Emphysema (ICD10-J43.9). Electronically Signed   By: Keith Rake M.D.   On: 03/14/2018 21:56   Dg Chest Port 1 View  Result Date: 04/04/2018 CLINICAL DATA:  Short of breath EXAM: PORTABLE CHEST 1 VIEW COMPARISON:  04/03/2018 FINDINGS: Underlying interstitial fibrosis. Superimposed bilateral airspace disease unchanged. No effusion. Port-A-Cath tip in the SVC unchanged. Prominent right hilum unchanged consistent with adenopathy. IMPRESSION: No interval change. Underlying interstitial fibrosis with superimposed diffuse bilateral airspace disease which could be infection or edema. Electronically Signed   By: Franchot Gallo M.D.   On: 04/04/2018 07:41   Dg Chest Port 1 View  Result Date: 04/03/2018 CLINICAL DATA:  Shortness of breath EXAM: PORTABLE CHEST 1 VIEW COMPARISON:  03/30/2018 FINDINGS: Cardiac shadow is stable. Right chest wall port is again seen. Multiple left rib fractures are again noted. Stable chronic emphysematous changes are noted. Some mild vascular congestion and patchy infiltrative opacities are again identified bilaterally relatively stable from the prior exam. Portion of these are related to the known underlying metastatic disease. No acute bony abnormality is noted. IMPRESSION: Stable appearance of the chest when compared with the prior study. Electronically Signed   By: Inez Catalina M.D.   On: 04/03/2018 07:06   Discharge Exam: Vitals:   April 17, 2018 0541 17-Apr-2018 0927  BP: 120/63   Pulse: (!) 101 (!) 108  Resp: (!) 6 16  Temp: 98.3 F (36.8 C)   SpO2: (!) 82% (S) (!) 81%   Vitals:    04/09/18 2032 2018-04-17 0541 2018-04-17 0927 04-17-18 1353  BP: 103/68 120/63    Pulse: (!) 101 (!) 101 (!) 108   Resp: (!) 6 (!) 6 16   Temp: 97.9 F (36.6 C) 98.3 F (36.8 C)    TempSrc: Oral Oral    SpO2: (!) 89% (!) 82% (S) (!) 81%   Weight:    53 kg  Height:    5\' 9"  (1.753 m)       The results of significant diagnostics from this hospitalization (including imaging, microbiology, ancillary and laboratory) are listed below for reference.     Microbiology: Recent Results (from the past 240 hour(s))  Culture, blood (routine x 2) Call MD if unable to obtain prior to antibiotics being given     Status: None   Collection Time: 03/24/2018  8:28 PM  Result Value Ref Range Status   Specimen Description   Final    BLOOD LEFT Performed at [facility], [place] [address] [place], [place] [address]    Special Requests   Final    BOTTLES DRAWN AEROBIC ONLY Blood Culture adequate volume Performed at [place] [address] [place], [place] [address]    Culture   Final    NO GROWTH 5 DAYS Performed at [facility], [place] [address], [place], [place] [address]    Report Status 04/05/2018 FINAL  Final  Culture, blood (routine x 2) Call MD if unable to obtain prior to antibiotics being given     Status: None   Collection Time: 04/17/2018  8:29 PM  Result Value Ref Range Status   Specimen Description   Final    BLOOD LEFT Performed at [place] [address], [place], [place] [address]    Special Requests   Final    BOTTLES DRAWN AEROBIC ONLY Blood Culture adequate volume Performed  at [facility], [place] [address], [place], [place] [address]    Culture   Final    NO GROWTH 5 DAYS Performed at [facility], [place] [address] [place], [place] [address]    Report Status 04/05/2018 FINAL  Final  Expectorated sputum assessment w rflx to resp cult     Status: None   Collection Time: 04/01/18  5:57  PM  Result Value Ref Range Status   Specimen Description SPUTUM  Final   Special Requests NONE  Final   Sputum evaluation   Final    Sputum specimen not acceptable for testing.  Please recollect.   NOTIFIED ARNOLD,A RN @1909  ON 04/01/18 JACKSON,K Performed at [facility], [place] [address] [place], [place] [address]    Report Status 04/01/2018 FINAL  Final  MRSA PCR Screening     Status: None   Collection Time: 04/01/18  6:40 PM  Result Value Ref Range Status   MRSA by PCR NEGATIVE NEGATIVE Final    Comment:        The GeneXpert MRSA Assay (FDA approved for NASAL specimens only), is one component of a comprehensive MRSA colonization surveillance program. It is not intended to diagnose MRSA infection nor to guide or monitor treatment for MRSA infections. Performed at [facility], [place] [address], [place], [place] [address]   Expectorated sputum assessment w rflx to resp cult     Status: None   Collection Time: 04/04/18  8:56 AM  Result Value Ref Range Status   Specimen Description SPUTUM  Final   Special Requests Normal  Final   Sputum evaluation   Final    THIS SPECIMEN IS ACCEPTABLE FOR SPUTUM CULTURE Performed at [facility], [place] [address], [place], [place] [address]    Report Status 04/04/2018 FINAL  Final  Culture, respiratory     Status: None   Collection Time: 04/04/18  8:56 AM  Result Value Ref Range Status   Specimen Description   Final    SPUTUM Performed at [place] [address], [place], [place] [address]    Special Requests   Final    Normal Reflexed from [id number] Performed at [facility], [place] [address] [place], [place] [address]    Gram Stain   Final    RARE WBC PRESENT, PREDOMINANTLY PMN RARE GRAM POSITIVE COCCI    Culture   Final    FEW Consistent with normal respiratory flora. Performed at [facility], [place] [address] [place], [place]  [address]    Report Status 04/06/2018 FINAL  Final     Labs: BNP (last 3 results) Recent Labs    12/14/17 2012 03/14/18 1415 03/28/2018 1611  BNP 32.9 64.7 49.1   Basic Metabolic Panel: Recent Labs  Lab 04/04/18 0347 04/05/18 0404 04/06/18 0543 04/06/18 0655 04/07/18 0614 04/07/18 0737  NA 138 138 QUESTIONABLE RESULTS, RECOMMEND RECOLLECT TO VERIFY 135 QUESTIONABLE RESULTS, RECOMMEND RECOLLECT TO VERIFY 139  K 5.0 4.6 QUESTIONABLE RESULTS, RECOMMEND RECOLLECT TO VERIFY 4.6 QUESTIONABLE RESULTS, RECOMMEND RECOLLECT TO VERIFY 4.4  CL 103 101 QUESTIONABLE RESULTS, RECOMMEND RECOLLECT TO VERIFY 97* QUESTIONABLE RESULTS, RECOMMEND RECOLLECT TO VERIFY 101  CO2 26 28 QUESTIONABLE RESULTS, RECOMMEND RECOLLECT TO VERIFY 28 QUESTIONABLE RESULTS, RECOMMEND RECOLLECT TO VERIFY 27  GLUCOSE 125* 126* QUESTIONABLE RESULTS, RECOMMEND RECOLLECT TO VERIFY 119* QUESTIONABLE RESULTS, RECOMMEND RECOLLECT TO VERIFY 126*  BUN 40* 44* QUESTIONABLE RESULTS, RECOMMEND RECOLLECT TO VERIFY 40* QUESTIONABLE  RESULTS, RECOMMEND RECOLLECT TO VERIFY 38*  CREATININE 1.01 1.02 QUESTIONABLE RESULTS, RECOMMEND RECOLLECT TO VERIFY 0.94 QUESTIONABLE RESULTS, RECOMMEND RECOLLECT TO VERIFY 0.93  CALCIUM 8.6* 8.8* QUESTIONABLE RESULTS, RECOMMEND RECOLLECT TO VERIFY 8.8* QUESTIONABLE RESULTS, RECOMMEND RECOLLECT TO VERIFY 8.7*  MG 2.4  --   --   --   --   --   PHOS 3.3  --   --   --   --   --    Liver Function Tests: Recent Labs  Lab 04/05/18 0404 04/06/18 0543 04/06/18 0655 04/07/18 0614 04/07/18 0737  AST 19 QUESTIONABLE RESULTS, RECOMMEND RECOLLECT TO VERIFY 20 QUESTIONABLE RESULTS, RECOMMEND RECOLLECT TO VERIFY 23  ALT 16 QUESTIONABLE RESULTS, RECOMMEND RECOLLECT TO VERIFY 14 QUESTIONABLE RESULTS, RECOMMEND RECOLLECT TO VERIFY 16  ALKPHOS 38 QUESTIONABLE RESULTS, RECOMMEND RECOLLECT TO VERIFY 41 QUESTIONABLE RESULTS, RECOMMEND RECOLLECT TO VERIFY 52  BILITOT 0.5 QUESTIONABLE RESULTS, RECOMMEND RECOLLECT TO VERIFY  0.8 QUESTIONABLE RESULTS, RECOMMEND RECOLLECT TO VERIFY 0.8  PROT 5.9* QUESTIONABLE RESULTS, RECOMMEND RECOLLECT TO VERIFY 6.0* QUESTIONABLE RESULTS, RECOMMEND RECOLLECT TO VERIFY 6.1*  ALBUMIN 2.4* QUESTIONABLE RESULTS, RECOMMEND RECOLLECT TO VERIFY 2.5* QUESTIONABLE RESULTS, RECOMMEND RECOLLECT TO VERIFY 2.6*   No results for input(s): LIPASE, AMYLASE in the last 168 hours. No results for input(s): AMMONIA in the last 168 hours. CBC: Recent Labs  Lab 04/04/18 1519 04/05/18 0404 04/06/18 0655 04/07/18 0614 04/07/18 0737  WBC 11.8* 12.7* 13.9* QUESTIONABLE RESULTS, RECOMMEND RECOLLECT TO VERIFY 13.9*  NEUTROABS 10.8* 11.1* 11.9* QUESTIONABLE RESULTS, RECOMMEND RECOLLECT TO VERIFY 11.9*  HGB 8.7* 9.0* 9.3* QUESTIONABLE RESULTS, RECOMMEND RECOLLECT TO VERIFY 9.7*  HCT 28.4* 28.7* 30.1* QUESTIONABLE RESULTS, RECOMMEND RECOLLECT TO VERIFY 31.2*  MCV 109.7* 108.7* 109.1* QUESTIONABLE RESULTS, RECOMMEND RECOLLECT TO VERIFY 109.9*  PLT 234 249 256 QUESTIONABLE RESULTS, RECOMMEND RECOLLECT TO VERIFY 248   Cardiac Enzymes: No results for input(s): CKTOTAL, CKMB, CKMBINDEX, TROPONINI in the last 168 hours. BNP: Invalid input(s): POCBNP CBG: Recent Labs  Lab 04/03/18 1925 04/03/18 2330 04/04/18 0321  GLUCAP 156* 151* 113*   D-Dimer No results for input(s): DDIMER in the last 72 hours. Hgb A1c No results for input(s): HGBA1C in the last 72 hours. Lipid Profile No results for input(s): CHOL, HDL, LDLCALC, TRIG, CHOLHDL, LDLDIRECT in the last 72 hours. Thyroid function studies No results for input(s): TSH, T4TOTAL, T3FREE, THYROIDAB in the last 72 hours.  Invalid input(s): FREET3 Anemia work up No results for input(s): VITAMINB12, FOLATE, FERRITIN, TIBC, IRON, RETICCTPCT in the last 72 hours. Urinalysis    Component Value Date/Time   COLORURINE YELLOW 03/14/2018 1709   APPEARANCEUR CLEAR 03/14/2018 1709   LABSPEC 1.024 03/14/2018 1709   PHURINE 6.0 03/14/2018 1709   GLUCOSEU  NEGATIVE 03/14/2018 1709   HGBUR MODERATE (A) 03/14/2018 1709   BILIRUBINUR NEGATIVE 03/14/2018 1709   KETONESUR NEGATIVE 03/14/2018 1709   PROTEINUR 30 (A) 03/14/2018 1709   UROBILINOGEN 0.2 09/06/2007 1440   NITRITE NEGATIVE 03/14/2018 1709   LEUKOCYTESUR NEGATIVE 03/14/2018 1709   Sepsis Labs Invalid input(s): PROCALCITONIN,  WBC,  LACTICIDVEN Microbiology Recent Results (from the past 240 hour(s))  Culture, blood (routine x 2) Call MD if unable to obtain prior to antibiotics being given     Status: None   Collection Time: 04/16/2018  8:28 PM  Result Value Ref Range Status   Specimen Description   Final    BLOOD LEFT Performed at [facility], [place] [address] [place], [place] [address]    Special Requests   Final  BOTTLES DRAWN AEROBIC ONLY Blood Culture adequate volume Performed at [place] [address], [place], [place] [address]    Culture   Final    NO GROWTH 5 DAYS Performed at [facility], [place] [address] [place], [place] [address]    Report Status 04/05/2018 FINAL  Final  Culture, blood (routine x 2) Call MD if unable to obtain prior to antibiotics being given     Status: None   Collection Time: 04/15/2018  8:29 PM  Result Value Ref Range Status   Specimen Description   Final    BLOOD LEFT Performed at [facility] [address], [place], [place] [address]    Special Requests   Final    BOTTLES DRAWN AEROBIC ONLY Blood Culture adequate volume Performed at [place] [address] [place], [place] [address]    Culture   Final    NO GROWTH 5 DAYS Performed at [facility], [place] [address], [place], [place] [address]    Report Status 04/05/2018 FINAL  Final  Expectorated sputum assessment w rflx to resp cult     Status: None   Collection Time: 04/01/18  5:57 PM  Result Value Ref Range Status   Specimen Description SPUTUM  Final   Special Requests NONE  Final    Sputum evaluation   Final    Sputum specimen not acceptable for testing.  Please recollect.   NOTIFIED ARNOLD,A RN @1909  ON 04/01/18 JACKSON,K Performed at [facility], [place] [address] [place] [address], [place], [place] [address]    Report Status 04/01/2018 FINAL  Final  MRSA PCR Screening     Status: None   Collection Time: 04/01/18  6:40 PM  Result Value Ref Range Status   MRSA by PCR NEGATIVE NEGATIVE Final    Comment:        The GeneXpert MRSA Assay (FDA approved for NASAL specimens only), is one component of a comprehensive MRSA colonization surveillance program. It is not intended to diagnose MRSA infection nor to guide or monitor treatment for MRSA infections. Performed at [facility], [place] [address], [place], [place] [address]   Expectorated sputum assessment w rflx to [facility]     Status: None   Collection Time: 04/04/18  8:56 AM  Result Value Ref Range Status   Specimen Description SPUTUM  Final   Special Requests Normal  Final   Sputum evaluation   Final    THIS SPECIMEN IS ACCEPTABLE FOR SPUTUM CULTURE Performed at [facility], [place] [address], [place], [place] [address]    Report Status 04/04/2018 FINAL  Final  Culture, respiratory     Status: None   Collection Time: 04/04/18  8:56 AM  Result Value Ref Range Status   Specimen Description   Final    SPUTUM Performed at [place] [address], [place], [place] [address]    Special Requests   Final    Normal Reflexed from [id number] Performed at [facility], [place] [address], [place], [place] [address]    Gram Stain   Final    RARE WBC PRESENT, PREDOMINANTLY PMN RARE GRAM POSITIVE COCCI    Culture   Final    FEW Consistent with normal respiratory flora. Performed at [facility], [place] [address] [place], [place] [address]    Report Status 04/06/2018 FINAL  Final     Time coordinating discharge: <30  minutes  SIGNED:   Louellen Molder, MD  [facility] 04-11-18, 5:35 PM Pager   If 7PM-7AM, please contact night-coverage [email or web address] Password [email or web address]

## 2018-04-23 NOTE — Progress Notes (Signed)
RT notified of patient being taken off [place] and placed on 2 L [place]. Patient comfort care at this time.

## 2018-04-23 DEATH — deceased

## 2020-04-14 IMAGING — DX DG CHEST 2V
3 series · 3 of 3 positions shown · non-contrast
Comparison: CT 02/21/2018, radiograph 02/21/2018, 01/27/2018

CLINICAL DATA: Respiratory failure

EXAM:
CHEST - 2 VIEW

[chest lat (1 of 2)]
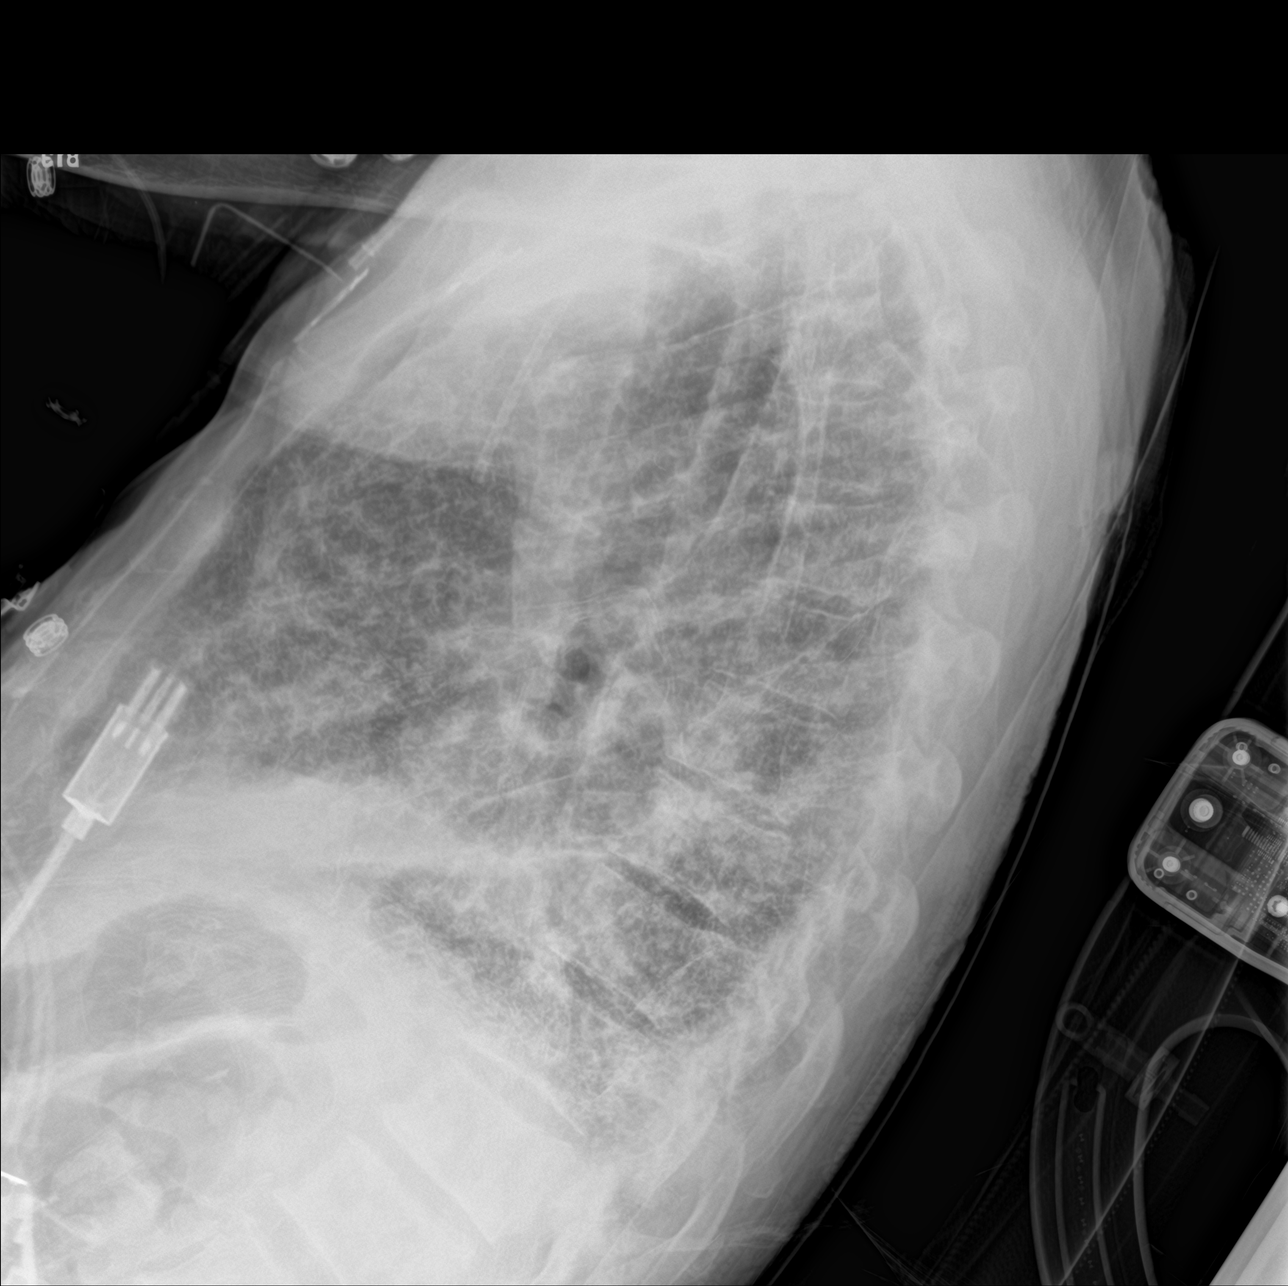

[chest ap]
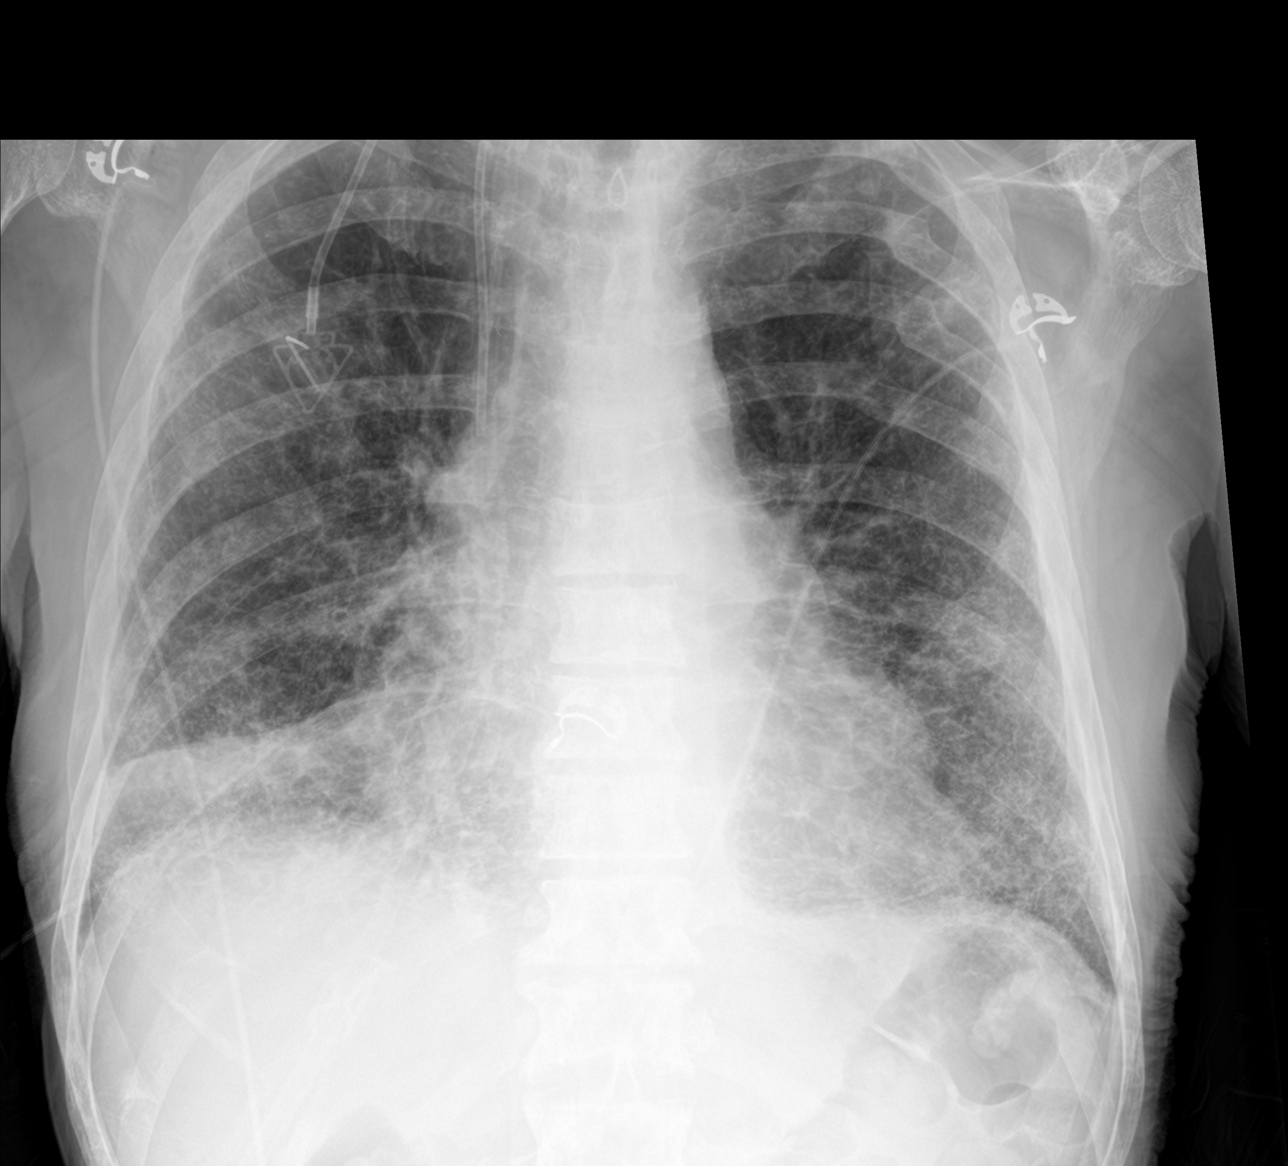

[chest lat (2 of 2)]
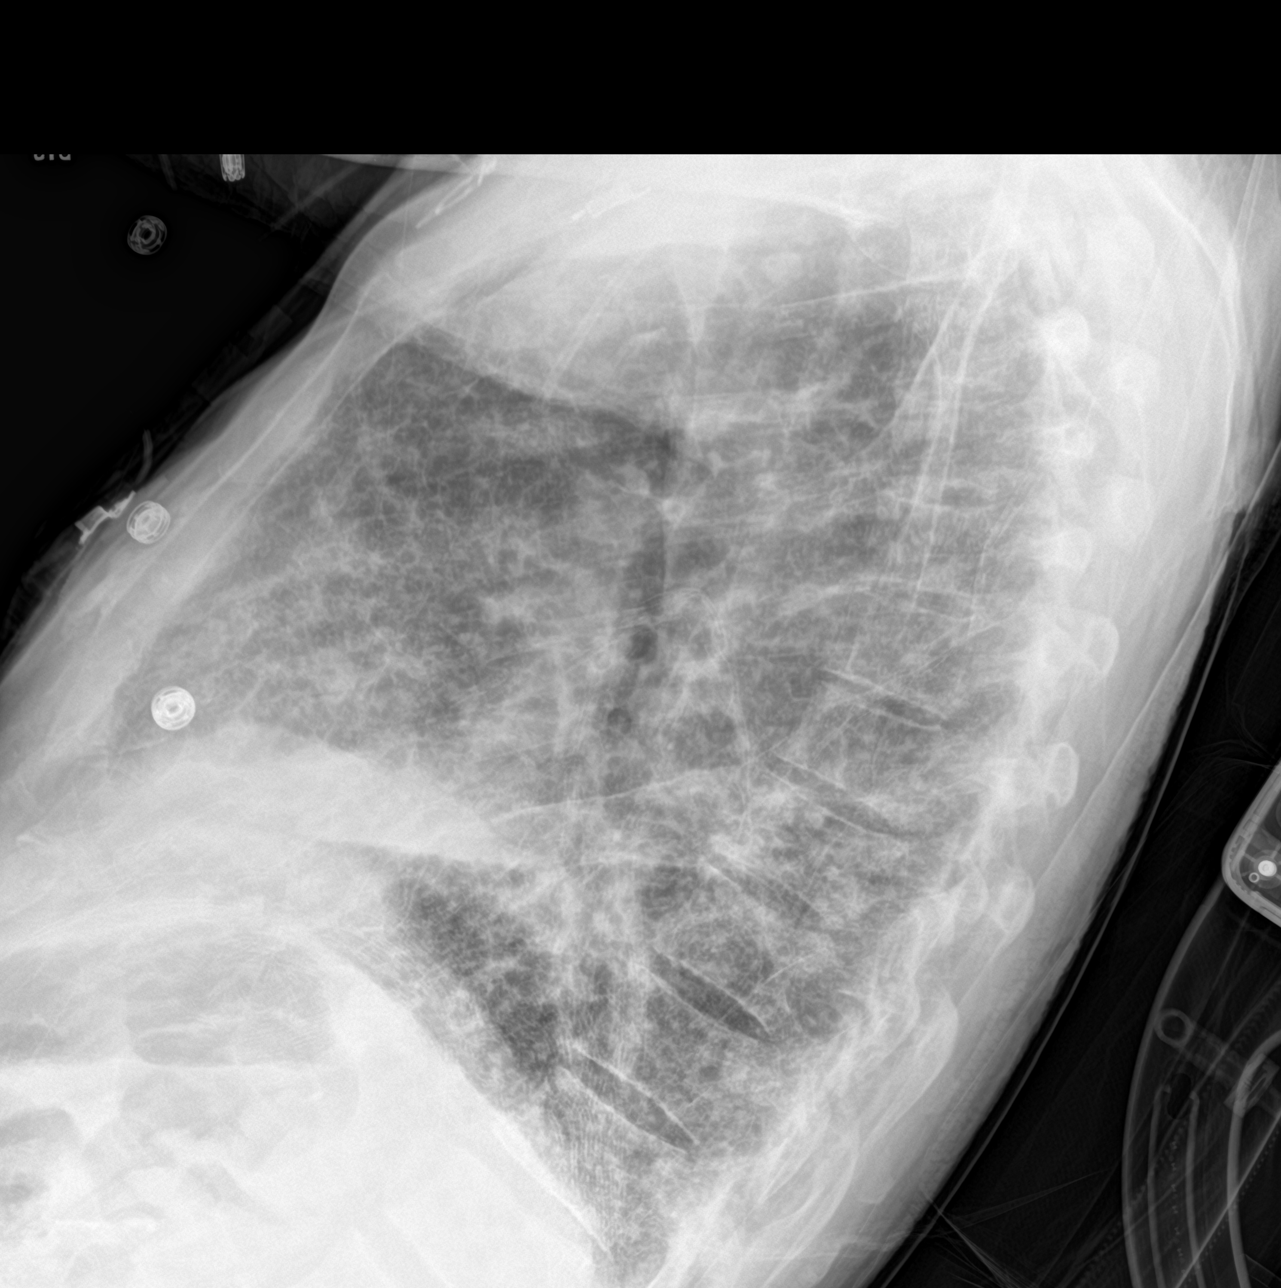

[3 of 3 positions shown; findings below may reference images not displayed]

FINDINGS: Right-sided central venous port tip over the SVC. Emphysematous
disease and bilateral fibrosis. Progressed consolidation at the
right middle lobe and right base. No pleural effusion. Bilateral
lung nodules corresponding to pulmonary metastatic disease. Old
left-sided rib fractures. Stable cardiomediastinal silhouette.
Multifocal sclerosis at the ribs and spine.
IMPRESSION: 1. Progressed airspace disease at the right middle lobe and right
base, may reflect atelectasis or pneumonia superimposed on chronic
interstitial disease
2. Diffuse fibrosis and emphysematous disease. Bilateral lung
nodules corresponding to history of metastatic pulmonary nodules.
3. Multifocal sclerosis consistent with osseous metastatic disease

## 2020-04-14 IMAGING — CT CT ANGIO CHEST
2 of 6 series · 17 of 46 positions shown · IV contrast (APPLIED)
Comparison: Multiple prior exams including radiographs earlier this
day. Most recent chest CTA 3 weeks ago 02/21/2018

CLINICAL DATA: Increasing shortness of breath. PE suspected, high
pretest prob

EXAM:
CT ANGIOGRAPHY CHEST WITH CONTRAST
TECHNIQUE: Multidetector CT imaging of the chest was performed using the
standard protocol during bolus administration of intravenous
contrast. Multiplanar CT image reconstructions and MIPs were
obtained to evaluate the vascular anatomy.
CONTRAST:  100mL 4GK0RE-88N IOPAMIDOL ([id number]-88N) INJECTION 76%

[Series 5: thins · axial · 0.67mm/px · z∈[+1242,+1539]mm · 15 of 327 slices shown]
[im 15/327  lung]
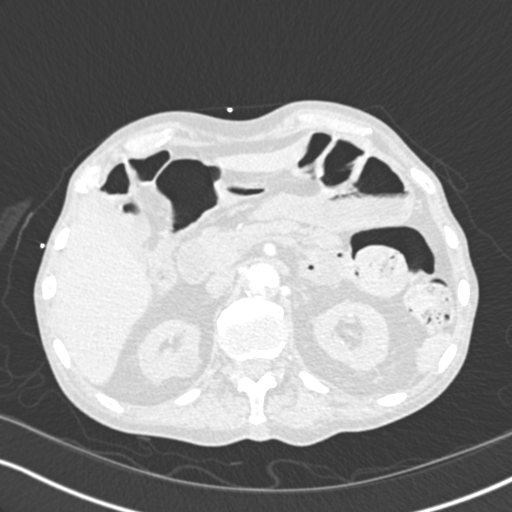
[im 43/327  soft-tissue]
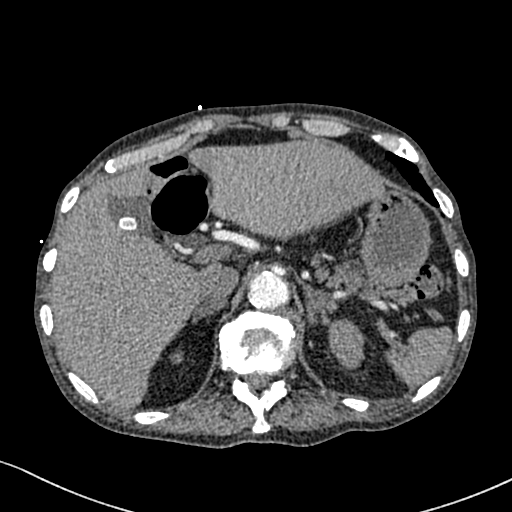
[im 57/327  lung]
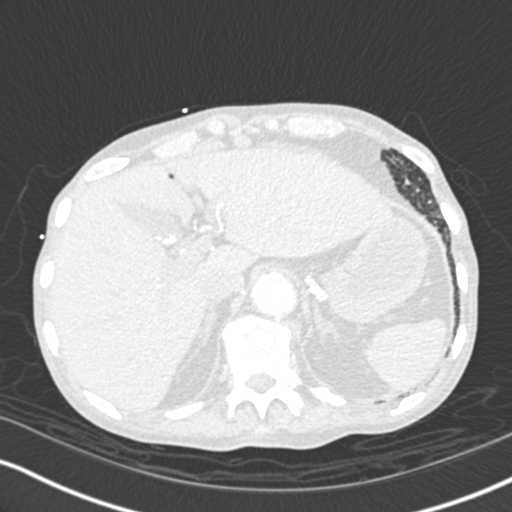
[im 86/327  soft-tissue]
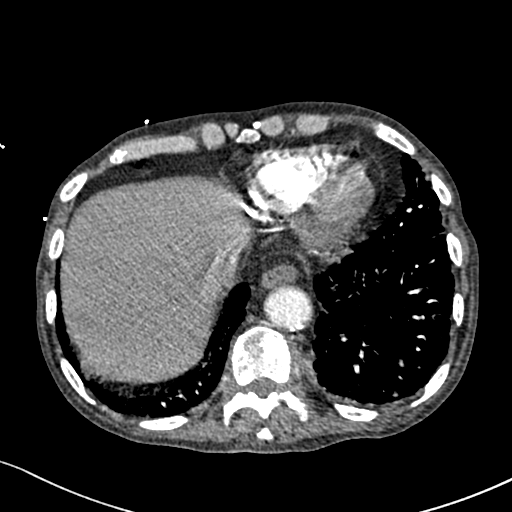
[im 100/327  lung]
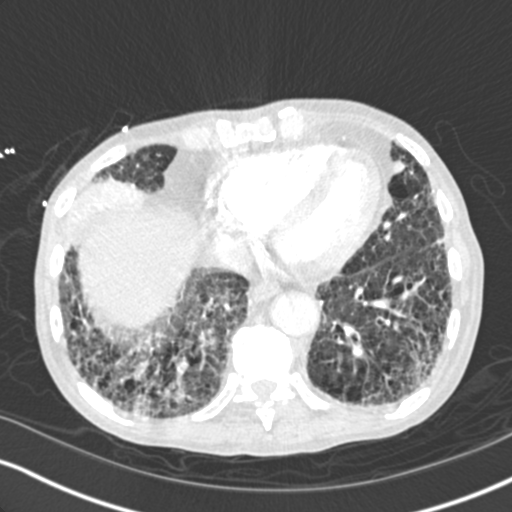
[im 128/327  soft-tissue]
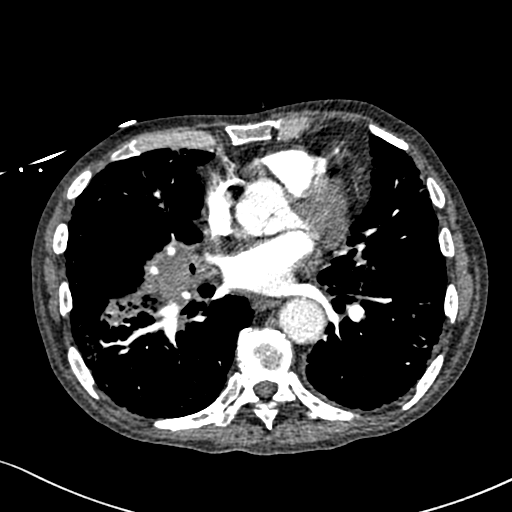
[im 142/327  lung]
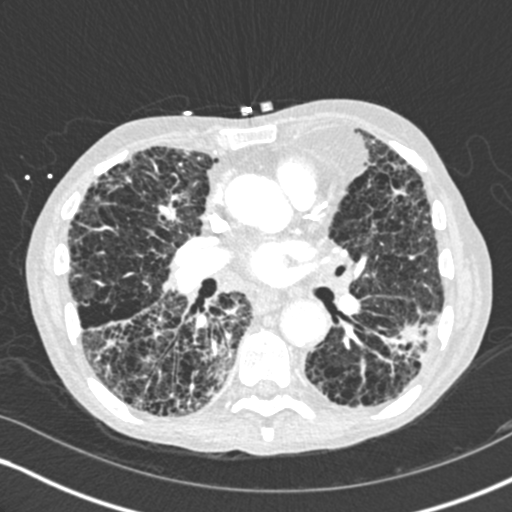
[im 171/327  soft-tissue]
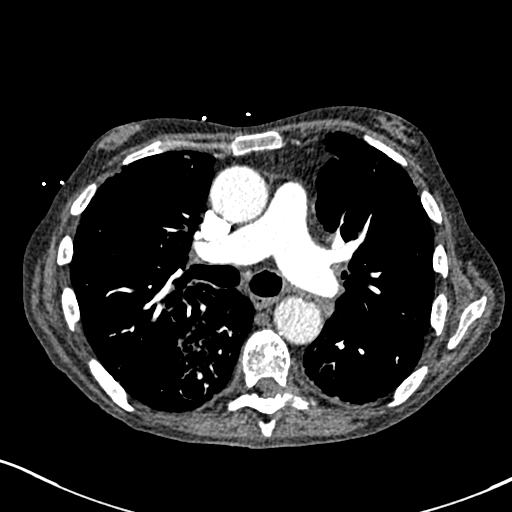
[im 185/327  lung]
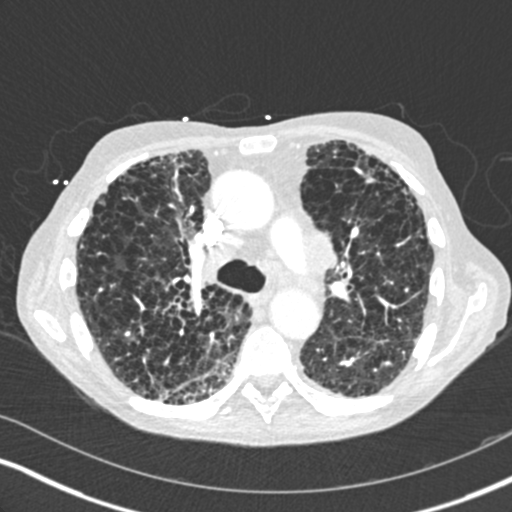
[im 199/327  soft-tissue]
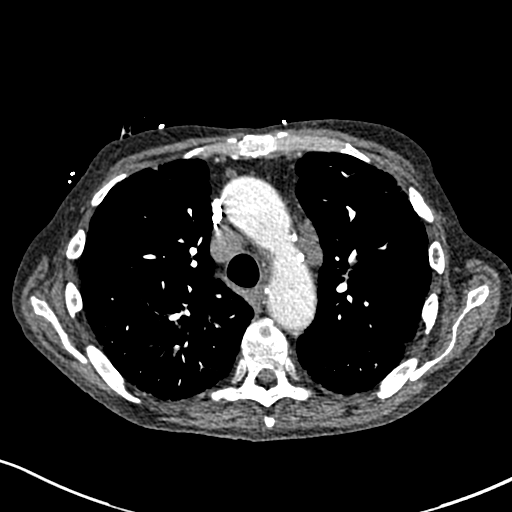
[im 227/327  lung]
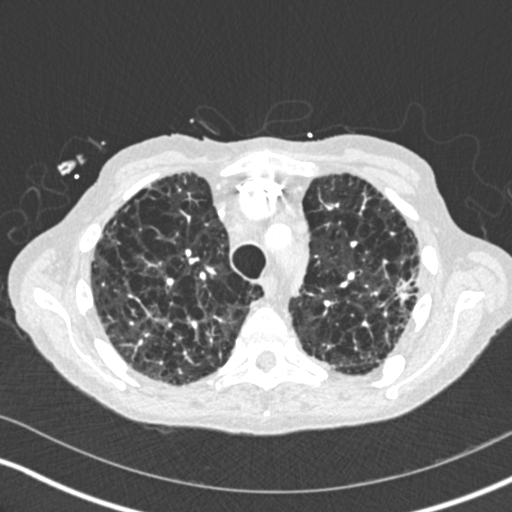
[im 241/327  soft-tissue]
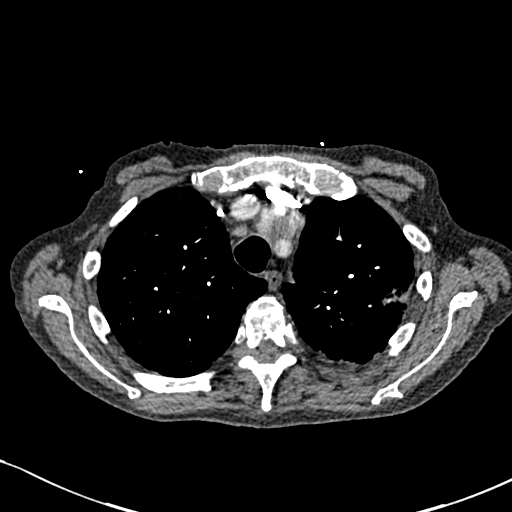
[im 270/327  lung]
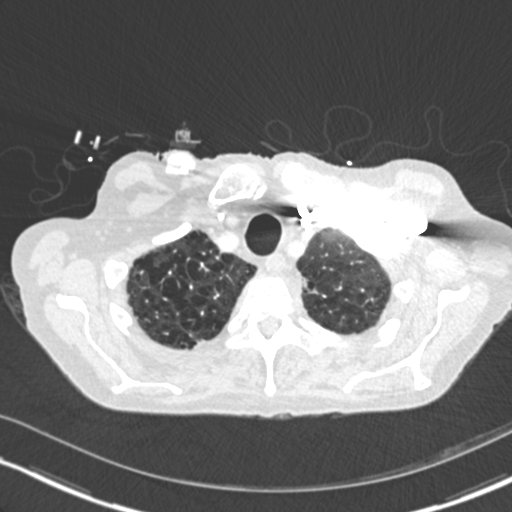
[im 284/327  soft-tissue]
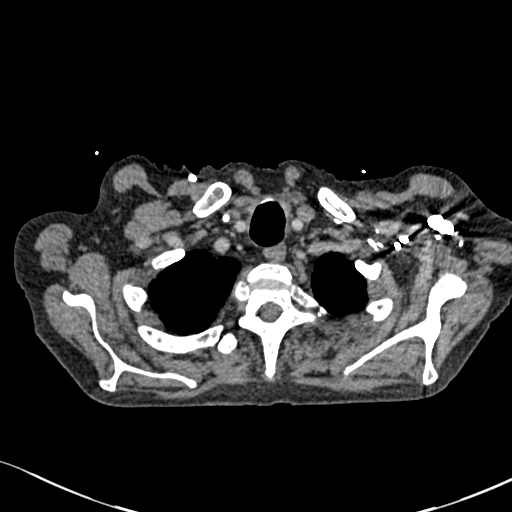
[im 312/327  lung]
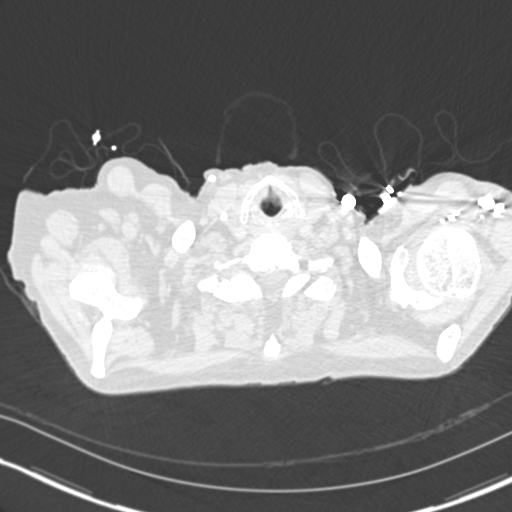

[Series 6: coronal mpr · coronal · 0.59mm/px · 2 of 71 slices shown]
[im 24/71  soft-tissue]
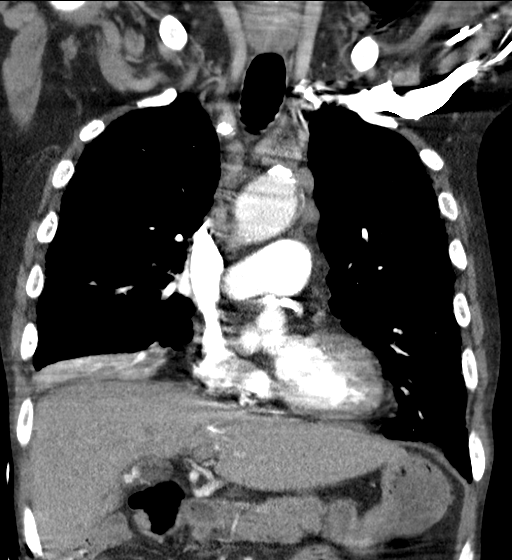
[im 47/71  soft-tissue]
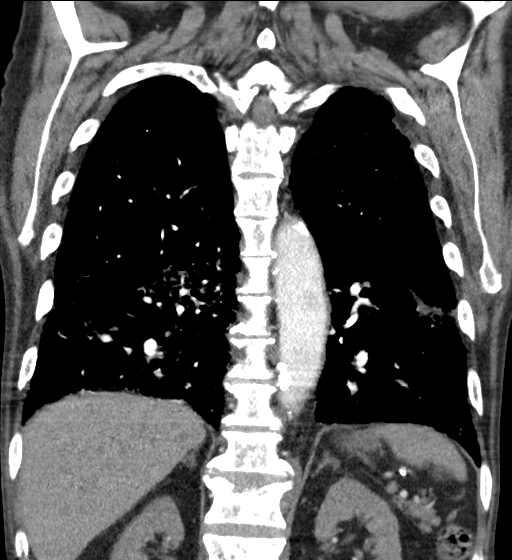

[17 of 46 positions shown; findings below may reference images not displayed]

FINDINGS: Cardiovascular: Right chest port with tip in the SVC. There are no
filling defects within the pulmonary arteries to suggest pulmonary
embolus. Thoracic atherosclerosis without aneurysm or dissection.
Coronary artery calcifications versus stents. Heart size is normal.
No pericardial effusion.

Mediastinum/Nodes: Definite progression of mediastinal adenopathy
from prior exam with increased number and size of multiple
mediastinal nodes. For example highest mediastinal node image 33
series 4 measures 12 mm short axis, previously 7 mm. Right lower
paratracheal node, image 46, measures 2.1 cm, previously 1.8 cm.
Right hilar adenopathy has progressed and now causes occlusion of
the right middle lobe bronchus. Multiple small left hilar nodes are
similar. Esophagus is nondilated. Bilateral gynecomastia. No
visualized thyroid nodule.

Lungs/Pleura: Advanced emphysema. Honeycombing and fibrosis at the
bases again seen.

Right hilar adenopathy causes complete right middle lobe collapse.
Multiple pulmonary nodules. Definite increase in lingular nodule
measuring 16 mm image 105 series 10, previously 9 mm. Previous right
middle lobe nodule obscured by surrounding collapse. Right basilar
lower lobe nodule measures 19 mm, image 117, unchanged. Multiple
additional pulmonary nodules have also increased. Peripheral opacity
in the left upper and lower lobes appears similar and is consistent
with scarring. Ill-defined right lower lobe perihilar opacities
suggest partial collapse rather than pneumonia. No significant
pleural fluid. No evidence of pulmonary edema.

Upper Abdomen: Bilateral adrenal nodularity as before.
Cholelithiasis. Irregular upper abdominal aortic atherosclerosis. No
acute findings.

Musculoskeletal: Diffuse osseous metastatic disease with mixed lytic
and sclerotic lesions. Extraosseous soft tissue extension of
metastasis involving left lamina at T7 invades the spinal canal.
Also spinal canal involvement from lesion involving right lamina of
T9. No acute fracture.

Review of the MIP images confirms the above findings.
IMPRESSION: 1. No pulmonary embolus.
2. Progression of metastatic disease in the thorax. Progression in
right hilar adenopathy causes occlusion of the right middle lobe
bronchus and right middle lobe collapse. Additional mediastinal
nodes have also increased in size. Increased size of multiple
pulmonary metastasis.
3. Diffuse osseous metastatic disease including extraosseous soft
tissue involvement of the spinal canal at T7 and T9.
4. Right lower lobe perihilar consolidation may be postobstructive
or infectious.
5. Advanced emphysema with basilar fibrosis.
6. Additional findings as described.

Aortic Atherosclerosis (PRSA4-620.0) and Emphysema (PRSA4-9YB.S).
